# Patient Record
Sex: Male | Born: 1945 | Race: White | Hispanic: No | Marital: Single | Smoking: Former smoker
Health system: Southern US, Community
[De-identification: ages and names within clinical notes are randomized; demographics above are authoritative.]

## PROBLEM LIST (undated history)

## (undated) DIAGNOSIS — K219 Gastro-esophageal reflux disease without esophagitis: Secondary | ICD-10-CM

## (undated) DIAGNOSIS — R0789 Other chest pain: Secondary | ICD-10-CM

## (undated) DIAGNOSIS — J9600 Acute respiratory failure, unspecified whether with hypoxia or hypercapnia: Secondary | ICD-10-CM

## (undated) DIAGNOSIS — I2510 Atherosclerotic heart disease of native coronary artery without angina pectoris: Secondary | ICD-10-CM

## (undated) DIAGNOSIS — H3320 Serous retinal detachment, unspecified eye: Secondary | ICD-10-CM

## (undated) DIAGNOSIS — I739 Peripheral vascular disease, unspecified: Secondary | ICD-10-CM

## (undated) DIAGNOSIS — M1990 Unspecified osteoarthritis, unspecified site: Secondary | ICD-10-CM

## (undated) DIAGNOSIS — H269 Unspecified cataract: Secondary | ICD-10-CM

## (undated) DIAGNOSIS — M545 Low back pain: Secondary | ICD-10-CM

## (undated) DIAGNOSIS — I509 Heart failure, unspecified: Secondary | ICD-10-CM

## (undated) DIAGNOSIS — Z87891 Personal history of nicotine dependence: Secondary | ICD-10-CM

## (undated) DIAGNOSIS — I779 Disorder of arteries and arterioles, unspecified: Secondary | ICD-10-CM

## (undated) DIAGNOSIS — I10 Essential (primary) hypertension: Secondary | ICD-10-CM

## (undated) DIAGNOSIS — F32A Depression, unspecified: Secondary | ICD-10-CM

## (undated) DIAGNOSIS — F329 Major depressive disorder, single episode, unspecified: Secondary | ICD-10-CM

## (undated) DIAGNOSIS — E785 Hyperlipidemia, unspecified: Secondary | ICD-10-CM

## (undated) DIAGNOSIS — J45909 Unspecified asthma, uncomplicated: Secondary | ICD-10-CM

## (undated) DIAGNOSIS — K649 Unspecified hemorrhoids: Secondary | ICD-10-CM

## (undated) DIAGNOSIS — G4730 Sleep apnea, unspecified: Secondary | ICD-10-CM

## (undated) DIAGNOSIS — R918 Other nonspecific abnormal finding of lung field: Principal | ICD-10-CM

## (undated) DIAGNOSIS — H409 Unspecified glaucoma: Secondary | ICD-10-CM

## (undated) DIAGNOSIS — Z87898 Personal history of other specified conditions: Secondary | ICD-10-CM

## (undated) DIAGNOSIS — I4891 Unspecified atrial fibrillation: Secondary | ICD-10-CM

## (undated) HISTORY — DX: Personal history of other specified conditions: Z87.898

## (undated) HISTORY — DX: Other chest pain: R07.89

## (undated) HISTORY — DX: Unspecified asthma, uncomplicated: J45.909

## (undated) HISTORY — DX: Depression, unspecified: F32.A

## (undated) HISTORY — PX: APPENDECTOMY: SHX54

## (undated) HISTORY — DX: Atherosclerotic heart disease of native coronary artery without angina pectoris: I25.10

## (undated) HISTORY — DX: Peripheral vascular disease, unspecified: I73.9

## (undated) HISTORY — DX: Unspecified hemorrhoids: K64.9

## (undated) HISTORY — PX: TUMOR REMOVAL: SHX12

## (undated) HISTORY — DX: Other nonspecific abnormal finding of lung field: R91.8

## (undated) HISTORY — DX: Unspecified osteoarthritis, unspecified site: M19.90

## (undated) HISTORY — DX: Unspecified cataract: H26.9

## (undated) HISTORY — PX: EYE SURGERY: SHX253

## (undated) HISTORY — DX: Low back pain: M54.5

## (undated) HISTORY — PX: HERNIA REPAIR: SHX51

## (undated) HISTORY — PX: GANGLION CYST EXCISION: SHX1691

## (undated) HISTORY — DX: Personal history of nicotine dependence: Z87.891

## (undated) HISTORY — PX: COLONOSCOPY: SHX174

## (undated) HISTORY — DX: Serous retinal detachment, unspecified eye: H33.20

## (undated) HISTORY — DX: Disorder of arteries and arterioles, unspecified: I77.9

## (undated) HISTORY — DX: Major depressive disorder, single episode, unspecified: F32.9

## (undated) HISTORY — DX: Unspecified glaucoma: H40.9

---

## 1999-06-10 ENCOUNTER — Encounter: Payer: Self-pay | Admitting: Family Medicine

## 1999-06-10 ENCOUNTER — Ambulatory Visit (HOSPITAL_COMMUNITY): Admission: RE | Admit: 1999-06-10 | Discharge: 1999-06-10 | Payer: Self-pay | Admitting: Family Medicine

## 2000-08-20 ENCOUNTER — Ambulatory Visit (HOSPITAL_COMMUNITY): Admission: RE | Admit: 2000-08-20 | Discharge: 2000-08-20 | Payer: Self-pay | Admitting: Gastroenterology

## 2002-04-22 ENCOUNTER — Inpatient Hospital Stay (HOSPITAL_COMMUNITY): Admission: EM | Admit: 2002-04-22 | Discharge: 2002-04-25 | Payer: Self-pay | Admitting: *Deleted

## 2003-05-04 ENCOUNTER — Encounter (INDEPENDENT_AMBULATORY_CARE_PROVIDER_SITE_OTHER): Payer: Self-pay | Admitting: Specialist

## 2003-05-04 ENCOUNTER — Ambulatory Visit (HOSPITAL_COMMUNITY): Admission: RE | Admit: 2003-05-04 | Discharge: 2003-05-04 | Payer: Self-pay | Admitting: Gastroenterology

## 2006-02-23 ENCOUNTER — Observation Stay (HOSPITAL_COMMUNITY): Admission: EM | Admit: 2006-02-23 | Discharge: 2006-02-24 | Payer: Self-pay | Admitting: *Deleted

## 2006-02-23 ENCOUNTER — Encounter (INDEPENDENT_AMBULATORY_CARE_PROVIDER_SITE_OTHER): Payer: Self-pay | Admitting: Specialist

## 2008-08-27 ENCOUNTER — Inpatient Hospital Stay (HOSPITAL_COMMUNITY): Admission: EM | Admit: 2008-08-27 | Discharge: 2008-08-29 | Payer: Self-pay | Admitting: Emergency Medicine

## 2008-08-28 ENCOUNTER — Encounter (INDEPENDENT_AMBULATORY_CARE_PROVIDER_SITE_OTHER): Payer: Self-pay | Admitting: Gastroenterology

## 2008-08-30 ENCOUNTER — Emergency Department (HOSPITAL_COMMUNITY): Admission: EM | Admit: 2008-08-30 | Discharge: 2008-08-30 | Payer: Self-pay | Admitting: Emergency Medicine

## 2011-05-12 NOTE — Consult Note (Signed)
NAMEUGO, THOMA              ACCOUNT NO.:  [id number]   MEDICAL RECORD NO.:  [id number]          PATIENT TYPE:  INP   LOCATION:  1425                         FACILITY:  [facility]   PHYSICIAN:  Shirley Friar, MDDATE OF BIRTH:  September 26, 1946   DATE OF CONSULTATION:  08/27/2008  DATE OF DISCHARGE:                                 CONSULTATION   We were asked to see Mr. Ishler today in consultation for GI bleed by  Dr. Jabier Mutton of the [facility] hospitalist service.   HISTORY OF PRESENT ILLNESS:  This is a 65 year old male with a history  of diverticulosis.  He reports bright red blood per rectum very early  this morning. Mr. Mcmiller awoke with what he thought was diarrhea, but  when he examined it turned out to be bright red blood per rectum mixed  with some stool. His last episode of bright red blood per rectum with  stool was about an hour ago, at 1:00 p.m. He reports a small amount of  bilateral lower abdominal soreness, but no particular abdominal pain.  Bowel movements are usually very regular.  He says he has one every  morning at 8:00 a.m. The patient is complaining of irritation from one  external hemorrhoids.  He exercises regularly.  He has lost 29 pounds on  Weight Watchers since January of 2009.  He has colonoscopies every 3  years with Dr. Tasia Catchings.  His last colonoscopy was approximately 2  years ago.  He does have a history of diverticulosis.   PAST MEDICAL HISTORY:  1. Hypertension.  2. Hyperlipidemia.  3. Depression.   SURGERIES:  1. Appendix.  2. Bilateral inguinal hernia repair.   CURRENT MEDICATIONS:  1. Zoloft.  2. Lisinopril.  3. Simvastatin.   ALLERGIES:  He has no known drug allergies.   SOCIAL HISTORY:  Negative for alcohol, tobacco and drugs.  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  He has a dog named Fred.   FAMILY HISTORY:  Is significant for colon cancer in his father, brother  and both sisters.   REVIEW OF  SYSTEMS:  Is negative for fever or recent illness, shortness  of breath, palpitations and as I mentioned, he has lost 29 pounds on  Weight Watchers since January 2009.  He exercises on a daily basis with  his dog Fred. They are walking several miles each day and his  hyperlipidemia and hypertension are under good control with medication  and diet.   PHYSICAL EXAM:  He is alert and oriented in no apparent distress.  CARDIOVASCULAR:  Regular rate and rhythm with no murmurs, rubs or  gallops.  LUNGS:  Clear to auscultation.  ABDOMEN:  Soft, nontender, nondistended, distended with good bowel  sounds.   LABS:  He currently has a hemoglobin of 9.0 with a hematocrit 26.6.  His  previous hemoglobin was 10.3, hematocrit 30.9. White count 8.0,  platelets 218,000 and that was taken at approximately 4:00 a.m. this  morning. BMET shows a BUN of 22, creatinine 0.79. Baseline hemoglobin is  approximately 14 judging by E-chart.  ASSESSMENT:  This is a 65 year old gentleman who is having a lower GI  bleed, likely diverticular in nature.  He has acute blood loss anemia  and hemorrhoids.  We will go ahead and schedule him for a colonoscopy in  the morning with Dr. Dorena Cookey and ask for his records to be sent from  Dr. Lucretia Kern office.  Thanks very much.      Stephani Police, [place]      Shirley Friar, MD  Electronically Signed    MLY/MEDQ  D:  08/27/2008  T:  08/27/2008  Job:  [id number]   cc:   Tasia Catchings, M.D.  Fax: [phone]

## 2011-05-12 NOTE — H&P (Signed)
NAME:  Paul Valdez, Paul Valdez NO.:  [id number]   MEDICAL RECORD NO.:  [id number]          PATIENT TYPE:  EMS   LOCATION:  ED                           FACILITY:  [facility]   PHYSICIAN:  Lucita Ferrara, MD         DATE OF BIRTH:  December 09, 1946   DATE OF ADMISSION:  08/27/2008  DATE OF DISCHARGE:                              HISTORY & PHYSICAL   CHIEF COMPLAINT:  The patient presented to [facility]  with chief complaint of bright red blood per rectum early in the date.  This is accompanied by left lower quadrant abdominal discomfort and  cramping accompanied by diarrhea, relieved by defecation and going to  the bathroom.  He has  had similar episodes in the past and actually  gets colonoscopy every 3 years for a family history of colon cancer.  He  had significant history of polyps in the past with none of them being  cancerous in etiology.   PRIMARY CARE PHYSICIAN:  Theatre stage manager.   GASTROENTEROLOGIST:  Tasia Catchings, M.D.   PAST MEDICAL HISTORY:  1. Hypertension.  2. Hypercholesteremia.   SOCIAL HISTORY:  No IV drugs, alcohol or tobacco.   ALLERGIES:  No known drug allergies.   MEDICATIONS AT HOME:  Include Zoloft, lisinopril, simvastatin.   REVIEW OF SYSTEMS:  As per HPI and otherwise negative.   PHYSICAL EXAMINATION:  GENERAL:  The patient is in no acute distress.  Blood pressure is 111/80, pulse 78, respirations 18, temperature 97.4.  HEENT:  Normocephalic, atraumatic.  Sclerae anicteric.  PERRLA.  External muscles intact.  NECK:  Supple.  No JVD or bruits.  CARDIOVASCULAR:  S1 and S2.  Regular rate and rhythm.  No murmurs, rubs  or clicks.  LUNGS:  Clear to auscultation bilaterally.  No rhonchi, rales or wheeze.  ABDOMEN:  Soft, nontender, nondistended.  Positive bowel sounds.  RECTAL:  There is a obvious hemorrhoids.  Guaiac is positive.  Stool is  dark.  NEURO:  Patient is oriented x3.  Cranial nerves II-XII grossly intact.   LABORATORY DATA:   The patient was typed and screened.  His ISTAT shows a  hemoglobin 12.19, hematocrit 38.  ISTAT shows BUN of 24, creatinine 1.   ASSESSMENT/PLAN:  A 65 year old male with;  1. Bright red blood per rectum with accompanied left lower quadrant      abdominal cramping and diarrhea.  2. History of polyps.  Gets colonoscopies every 3 years for family      history of colon cancer.  3. Hypertension, stable.  4. Hypercholesteremia.  5. Hemorrhoids   DISCUSSION AND PLAN:  Most likely etiology of his right bright red blood  per rectum is internal hemorrhoids versus active hemorrhoids versus  diverticulitis, although he has no fevers or chills.  I do not see a CBC  to look at his white count.  Will go ahead and admit him to the med/surg  floor and get a CBC.  We will get stools for C diff and occult blood.  Will empirically treat with metronidazole.  Will get hemoglobin and  hematocrit  every 6 hours, type and cross and hold.  Transfuse as needed.  We will need to get a GI consultation for further advice.  Will get Dr.  Sherin Quarry on the case.  The rest of the plans are really dependent on his  progress.      Lucita Ferrara, MD  Electronically Signed     RR/MEDQ  D:  08/27/2008  T:  08/27/2008  Job:  [id number]

## 2011-05-12 NOTE — Consult Note (Signed)
NAMERAGE, BEEVER              ACCOUNT NO.:  [id number]   MEDICAL RECORD NO.:  [id number]          PATIENT TYPE:  EMS   LOCATION:  ED                           FACILITY:  [facility]   PHYSICIAN:  John C. Madilyn Fireman, M.D.    DATE OF BIRTH:  08-Jun-1946   DATE OF CONSULTATION:  DATE OF DISCHARGE:  08/30/2008                                 CONSULTATION   REASON FOR CONSULTATION:  Hematochezia.   HISTORY OF ILLNESS:  The patient is a 65 year old white male who was  recently admitted for GI bleeding thought likely secondary to  diverticulosis and discharged yesterday who had a nonbloody bowel  movement yesterday evening but noticed some bright red blood in a bowel  movement today and I did take off a polyp on September 1 and had come to  the emergency room for evaluation.  Since then he had another fairly  formed bowel movement with no visible blood.  He does have known  external hemorrhoids.  He feels fine.  He is having no abdominal pain,  weakness or dizziness.  His discharge hemoglobin was 9.5.  He has no new  complaints.   PHYSICAL EXAM:  HEART:  Regular rate and rhythm without murmurs.  LUNGS:  Clear.  ABDOMEN:  Soft, nondistended with normoactive bowel sounds.  No  hepatosplenomegaly, mass or guarding.  RECTAL:  Rectal exam reveals some  rosette of external hemorrhoids.  There is brown stool in the vault with  no visible blood.   IMPRESSION:  Likely self-limiting gastrointestinal bleed from  hemorrhoids after a recent presumed diverticular bleed.   PLAN:  We will discharge home and call with biopsy results of his  polypectomy.  Note:  Hemoglobin today is 10.1.           ______________________________  Everardo All. Madilyn Fireman, M.D.     JCH/MEDQ  D:  08/30/2008  T:  08/30/2008  Job:  [id number]   cc:   Tasia Catchings, M.D.  Fax: [phone]

## 2011-05-12 NOTE — Op Note (Signed)
Paul Valdez, Paul Valdez              ACCOUNT NO.:  [id number]   MEDICAL RECORD NO.:  [id number]          PATIENT TYPE:  INP   LOCATION:  1425                         FACILITY:  [facility]   PHYSICIAN:  John C. Madilyn Fireman, M.D.    DATE OF BIRTH:  09/26/1946   DATE OF PROCEDURE:  08/28/2008  DATE OF DISCHARGE:                               OPERATIVE REPORT   PROCEDURE:  Colonoscopy with polypectomy.   INDICATIONS FOR PROCEDURE:  Acute lower GI bleed.   PROCEDURE:  The patient was placed in the left lateral decubitus  position and placed on the pulse monitor with continuous low-flow oxygen  delivered by nasal cannula.  He was sedated with 75 mcg IV fentanyl and  6 mg IV Versed.  The Olympus video colonoscope was inserted into the  rectum and advanced to the cecum, confirmed by transillumination of  McBurney's point and visualization of the ileocecal valve and  appendiceal orifice.  The prep was good and there was no blood or bloody  fluid seen throughout the colon.  There were three of four small  somewhat indistinct cecal AVMs with no stigma of hemorrhage.  No therapy  was done to them.  There was one small diverticulum seen at the junction  of the cecum and the ascending colon.  The remainder of the ascending  colon appeared normal.  Within the transverse colon there was seen a 4  mm polyp which was fulgurated by hot biopsy.  The remainder of the  transverse colon appeared normal.  Within the descending sigmoid colon  there were seen numerous small diverticula again, with no stigma of  hemorrhage.  There was no evidence of ischemic or any other type of  visible colitis in the vicinity of the descending sigmoid or rectum down  to the anus.  The rectum appeared normal and retroflexed view revealed  no obviously enlarged internal hemorrhoids.  The scope was then  withdrawn and the patient returned to the recovery room in stable  condition.  He tolerated the procedure well.  There were no  immediate  complications.   IMPRESSION:  1. Small cecal arteriovenous malformations.  2. Descending sigmoid diverticulosis.  3. A single small transverse colon polyp.   PLAN:  Await histology and observe for further bleeding.           ______________________________  Everardo All. Madilyn Fireman, M.D.     JCH/MEDQ  D:  08/28/2008  T:  08/28/2008  Job:  [id number]   cc:   Tasia Catchings, M.D.  Fax: [phone]

## 2011-05-15 NOTE — Discharge Summary (Signed)
[facility]  Patient:    Paul Valdez, Paul Valdez Visit Number: [id number] MRN: [id number]          Service Type: PSY Location: 300 0300 01 Attending Physician:  Jeanice Lim Dictated by:   Jeanice Lim, M.D. Admit Date:  04/22/2002 Discharge Date: 04/25/2002                             Discharge Summary  IDENTIFYING DATA:  This is a 65 year old single Caucasian male voluntarily admitted for depression and suicidal ideation.  MEDICATIONS:  Zocor.  ALLERGIES:  No known drug allergies.  PHYSICAL EXAMINATION:  Performed in [facility] Emergency Department and essentially within normal limits.  Neurologically nonfocal.  LABORATORY DATA:  White count was 17.1.  CMET showed a glucose of 218, BUN 25. Tylenol level 15.6.  Urine drug screen positive for opiates.  MENTAL STATUS EXAMINATION:  The patient was alert, cooperative, reporting significant depressive symptoms.  Overdosed due to feeling overwhelmed and stress related to changing jobs.  Mood severely depressed.  Affect restricted. Thought process goal directed.  Thought content negative for dangerous ideation or psychotic symptoms, positive regarding overdose.  Judgment and insight fair.  ADMISSION DIAGNOSES: Axis I:    1. Major depressive disorder, recurrent, severe.            2. Status post suicide attempt. Axis II:   None. Axis III:  Hypertension. Axis IV:   Moderate (problems with primary support system). Axis V:    20/60.  HOSPITAL COURSE:  The patient was admitted and ordered routine p.r.n. medications.  He was optimized on Zoloft and reported a positive response. Showed no inappropriate behavior.  Participating fully in individual, group and milieu therapy.  Denying any dangerous ideation after adequate monitoring.  CONDITION ON DISCHARGE:  Improved.  Mood was more euthymic.  Affect brighter. Thought process goal directed.  Thought content negative for dangerous ideation or  psychotic symptoms.  The patient reported ability to deal with psychosocial stressors and motivation to be compliant with outpatient plan, comfortable with discharge.  DISCHARGE MEDICATIONS:  Zoloft 50 mg q.a.m.  FOLLOW-UP:  Dr. Katrinka Blazing and Glendell Docker.  DISCHARGE DIAGNOSES: Axis I:    1. Major depressive disorder, recurrent, severe.            2. Status post suicide attempt. Axis II:   None. Axis III:  Hypertension. Axis IV:   Moderate (problems with primary support system). Axis V:    Global Assessment of Functioning on discharge 55. Dictated by:   Jeanice Lim, M.D. Attending Physician:  Jeanice Lim DD:  05/24/02 TD:  05/25/02 Job: [id number]

## 2011-05-15 NOTE — H&P (Signed)
[name], Valdez              ACCOUNT NO.:  [id number]   MEDICAL RECORD NO.:  [id number]          PATIENT TYPE:  EMS   LOCATION:  MAJO                         FACILITY:  [facility]   PHYSICIAN:  Paul Dare. Janee Valdez, M.D.DATE OF BIRTH:  1946/01/12   DATE OF ADMISSION:  02/23/2006  DATE OF DISCHARGE:                                HISTORY & PHYSICAL   CHIEF COMPLAINT:  Right lower quadrant abdominal pain.   HISTORY OF PRESENT ILLNESS:  The patient is a 65 year old white male who  developed mid abdominal pain yesterday afternoon.  This has gradually over  time localized to his right lower quadrant.  He has had anorexia but no  vomiting.  He saw his primary medical doctor today who sent him for CT scan  of the abdomen and pelvis at [facility].  This demonstrated  appendicitis, and he was referred to [facility] Emergency Department.   PAST MEDICAL HISTORY:  1.  Hypertension.  2.  Hypercholesterolemia.  3.  Depression.   PAST SURGICAL HISTORY:  1.  Laparoscopic bilateral inguinal hernia repair.  2.  Prostate biopsies.   MEDICATIONS:  1.  Citrulline 100 mg p.o. daily.  2.  Doxazosin 8 mg daily.  3.  Lisinopril/hydrochlorothiazide 10/12.5 one daily.  4.  Vitorin 10/40 one daily.   ALLERGIES:  No known drug allergies.   SOCIAL HISTORY:  He does not smoke.  He works for [employer].   REVIEW OF SYSTEMS:  GENERAL: Negative.  CARDIAC: Negative.  PULMONARY:  Negative.  GI: As above plus the patient had some diarrhea today, and he is  hungry now.  GU: Negative.  MUSCULOSKELETAL: Negative.   PHYSICAL EXAMINATION:  VITAL SIGNS:  Temperature 100.1, blood pressure  148/87, pulse 99, respirations 20.  GENERAL:  He is awake and alert.  HEENT: Pupils are equal.  Sclerae clear.  NECK:  Supple.  LUNGS:  Clear to auscultation.  HEART:  Regular.  Impulse palpable in the left chest.  ABDOMEN:  Soft.  No masses are palpated.  He has tenderness in the right  lower quadrant with  voluntary guarding. No organomegaly is noted.  MUSCULOSKELETAL: No focal weakness.  SKIN: Warm and dry with no rashes.   DATA REVIEWED:  Labs are pending.   CT scan was reviewed with radiologist here and demonstrates inflammation  around the cecum with a couple of bubbles of air that may represent  peroration of acute appendicitis.   IMPRESSION:  Acute appendicitis with possible perforation.   PLAN:  We will take him to the operating room for emergency laparoscopic  appendectomy.  Procedure, risks, and benefits were discussed in detail with  the patient, and he is agreeable.  We will give him intravenous antibiotics  and heparin.  We will check some STAT labs.      Paul Valdez, M.D.  Electronically Signed     BET/MEDQ  D:  02/23/2006  T:  02/23/2006  Job:  [id number]

## 2011-05-15 NOTE — Op Note (Signed)
Paul Valdez, Paul Valdez              ACCOUNT NO.:  [id number]   MEDICAL RECORD NO.:  [id number]          PATIENT TYPE:  INP   LOCATION:  2550                         FACILITY:  [facility]   PHYSICIAN:  Gabrielle Dare. [name], M.D.DATE OF BIRTH:  [birth year]   DATE OF PROCEDURE:  02/23/2006  DATE OF DISCHARGE:                                 OPERATIVE REPORT   PREOPERATIVE DIAGNOSIS:  Acute appendicitis.   POSTOPERATIVE DIAGNOSIS:  Acute appendicitis.   PROCEDURE:  Laparoscopic appendectomy.   SURGEON:  Gabrielle Dare. [name], M.D.   ANESTHESIA:  General.   HISTORY OF PRESENT ILLNESS:  The patient is a 65 year old male that  developed generalized abdominal pain yesterday afternoon; this gradually  localized to the right lower quadrant.  He was seen by his primary care  doctor today and sent for CT scan of the abdomen and pelvic at [facility]  Radiology; this was consistent with acute appendicitis and he was sent to  [facility] for care.  His history and physical examination were  consistent and he is brought to the operating room for emergency  appendectomy.   PROCEDURE IN DETAIL:  Informed consent was obtained.  The patient received  intravenous antibiotics.  He was identified in the preop holding area and  brought to the operating room.  General anesthesia was administered.  His  abdomen was prepped and draped in a sterile fashion.  Marcaine 0.25% with  epinephrine was injected in the infraumbilical region.  An infraumbilical  incision was made along his previous scar and the subcutaneous tissues were  dissected down, revealing the anterior fascia, which was divided sharply.  The peritoneal cavity was entered under direct vision.  A 0 Vicryl  pursestring suture was placed around the fascial opening and the Hasson  trocar was inserted into the abdomen and the abdomen was insufflated with  carbon dioxide in the standard fashion.  Under direct vision, a 12-mm left  lower quadrant and  a 5-mm right upper quadrant port were placed.  Marcaine  0.25% was used at each port site.  Laparoscopic exploration revealed an  inflamed appendix.  It was retracted superiorly.  Dissection of the  mesoappendix was then accomplished without difficulty.  The mesoappendix was  then divided with a endoscopic GIA with a vascular load and subsequently,  the base of the appendix was then divided with an endoscopic GIA stapler  with a vascular load.  The appendix was placed in an EndoCatch bag and taken  out of the abdomen via the left lower quadrant port site.  The abdomen was  then copiously irrigated; irrigation fluid was clear.  The staple lines were  rechecked and there was no bleeding and they were nicely intact.  The  remainder of the irrigation fluid was evacuated.  The staple lines were  rechecked and were intact with no bleeding.  Pneumoperitoneum was released.  The ports were removed under direct vision and the Hasson trocar was removed  from the abdomen.  The infraumbilical fascia was closed by tying a 0 Vicryl  pursestring suture.  All 3 wounds were copiously irrigated.  Some  additional  local anesthetic was injected and the skin of each was closed with a running  4-  0 Vicryl subcuticular stitch.  Sponge, needle and instrument counts were  correct.  Benzoin, Steri-Strips and sterile dressings were applied.  The  patient tolerated the procedure well without apparent complication and was  taken to the recovery room in stable condition.      Gabrielle Dare [name], M.D.  Electronically Signed     BET/MEDQ  D:  02/23/2006  T:  02/24/2006  Job:  [id number]

## 2011-05-15 NOTE — Discharge Summary (Signed)
Paul Valdez, STEEVES              ACCOUNT NO.:  [id number]   MEDICAL RECORD NO.:  [id number]          PATIENT TYPE:  INP   LOCATION:  5715                         FACILITY:  [facility]   PHYSICIAN:  Gabrielle Dare. [name], M.D.DATE OF BIRTH:  07/29/1946   DATE OF ADMISSION:  02/23/2006  DATE OF DISCHARGE:  02/24/2006                                 DISCHARGE SUMMARY   DISCHARGE DIAGNOSIS:  1.  Acute appendicitis.  2.  Status post laparoscopic appendectomy.   HISTORY OF PRESENT ILLNESS:  The patient is a 65 year old male who developed  generalized abdominal pain that gradually localized to his right lower  quadrant.  On the day of admission, he was seen by his primary care doctor  who sent him for a CT scan at [facility].  This showed  appendicitis.  He was sent to [facility] Emergency Department.   HOSPITAL COURSE:  The patient was admitted and underwent emergency  laparoscopic appendectomy. This was uncomplicated.  Postoperatively, he  remained afebrile and hemodynamically stable.  He tolerated gradual  advancement of his diet and was discharged home on postoperative day one in  stable condition.   DISCHARGE DIET:  Regular.   DISCHARGE ACTIVITY:  No lifting.   DISCHARGE MEDICATIONS:  Percocet 5/325, 1-2 q.6h. p.r.n. pain.  He will also  continue his home medications.   FOLLOW UP:  Follow-up is in two weeks with myself.      Gabrielle Dare [name], M.D.  Electronically Signed     BET/MEDQ  D:  04/22/2006  T:  04/22/2006  Job:  [id number]

## 2011-05-15 NOTE — Discharge Summary (Signed)
Paul Valdez, Paul Valdez              ACCOUNT NO.:  [id number]   MEDICAL RECORD NO.:  [id number]          PATIENT TYPE:  INP   LOCATION:  1425                         FACILITY:  [facility]   PHYSICIAN:  Hollice Espy, M.D.DATE OF BIRTH:  October 29, 1946   DATE OF ADMISSION:  08/27/2008  DATE OF DISCHARGE:  08/29/2008                               DISCHARGE SUMMARY   PRIMARY CARE PHYSICIAN:  Dr. Elvera Lennox.   DISCHARGE DIAGNOSES:  1. Diverticular bleed, self-contained.  2. Hypertension.  3. Hyperlipidemia.  4. Depression.   DISCHARGE MEDICATIONS:  1. Lisinopril 5 p.o. daily.  2. Simvastatin 20 p.o. q.h.s.  3. Zoloft 50 p.o. daily.  These medications will be restarted in 5      days.   HOSPITAL COURSE:  The patient is a 65 year old white male with a past  medical history of hypertension, hyperlipidemia and depression, who  presented with several days of bright red blood per rectum.  He was  feeling very weak.  He was made n.p.o. and put on IV fluids.  His  hemoglobin was noted to be 10.3 with IV fluids.  There is a question  about his baseline being around 14.  His hemoglobin dropped to 7.8 on  hospital day 2.  GI was consulted who saw the patient.  The patient  underwent a colonoscopy which he tolerated well.  The patient did  require 2 units of packed red blood cells which he was transfused and  hemoglobin improved to 9.6 on August 28, 2008.  The patient underwent  a colonoscopy which showed 1 single polyp which was sent for pathology.  This was thought most likely he had diverticular bleeding, although the  possibility of AVMs was present.  The patient was stable on the  afternoon of August 29, 2008 tolerating p.o. and felt to be cleared  by GI.  He was having no further bleeding.  His hemoglobin actually  improved from August 28, 2008 to August 29, 2008 with his  hemoglobin increasing from 9.2 to 9.5.  At this point, he is felt to be  medically stable for  discharge.   FOLLOW UP:  The patient will follow up with his PCP, Dr. Elvera Lennox in  2 weeks.   DISCHARGE DIET:  Low-sodium diet.   ACTIVITY:  Increase activity slowly.   DISPOSITION:  He is being discharged to home.  His overall disposition  is improved.      Hollice Espy, M.D.  Electronically Signed     SKK/MEDQ  D:  09/26/2008  T:  09/26/2008  Job:  [id number]   cc:   Elvera Lennox, MD   Llana Aliment. Malon Kindle., M.D.  Fax: [phone]

## 2011-09-29 ENCOUNTER — Other Ambulatory Visit: Payer: Self-pay | Admitting: Ophthalmology

## 2011-09-30 LAB — CBC
HCT: 28.3 — ABNORMAL LOW
HCT: 29.9 — ABNORMAL LOW
Hemoglobin: 9.5 — ABNORMAL LOW
Hemoglobin: 9.6 — ABNORMAL LOW
MCHC: 33.7
MCHC: 34.1
MCHC: 34.3
MCV: 92.6
MCV: 92.8
MCV: 93.6
Platelets: 159
RBC: 2.98 — ABNORMAL LOW
RBC: 3.05 — ABNORMAL LOW
RBC: 3.19 — ABNORMAL LOW
RDW: 14
WBC: 7.5

## 2011-09-30 LAB — BASIC METABOLIC PANEL
BUN: 12
CO2: 24
Calcium: 8 — ABNORMAL LOW
Chloride: 110
Creatinine, Ser: 0.9
GFR calc Af Amer: >60
GFR calc non Af Amer: >60
Glucose, Bld: 148 — ABNORMAL HIGH
Potassium: 3.7
Sodium: 140

## 2011-09-30 LAB — DIFFERENTIAL
Basophils Relative: 2 — ABNORMAL HIGH
Eosinophils Absolute: 0.2
Eosinophils Relative: 2
Monocytes Absolute: 0.8
Monocytes Relative: 10
Neutrophils Relative %: 68

## 2011-09-30 LAB — HEMOGLOBIN AND HEMATOCRIT, BLOOD
HCT: 28.5 — ABNORMAL LOW
Hemoglobin: 9 — ABNORMAL LOW
Hemoglobin: 9.5 — ABNORMAL LOW

## 2013-04-10 ENCOUNTER — Emergency Department (HOSPITAL_COMMUNITY)
Admission: EM | Admit: 2013-04-10 | Discharge: 2013-04-10 | Disposition: A | Discharge status: Home / Self Care | Payer: Medicare Other | Attending: Emergency Medicine | Admitting: Emergency Medicine

## 2013-04-10 ENCOUNTER — Telehealth: Payer: Self-pay | Admitting: Cardiology

## 2013-04-10 ENCOUNTER — Emergency Department (HOSPITAL_COMMUNITY): Payer: Medicare Other

## 2013-04-10 ENCOUNTER — Encounter (HOSPITAL_COMMUNITY): Payer: Self-pay | Admitting: Emergency Medicine

## 2013-04-10 DIAGNOSIS — R079 Chest pain, unspecified: Secondary | ICD-10-CM

## 2013-04-10 DIAGNOSIS — Z7982 Long term (current) use of aspirin: Secondary | ICD-10-CM | POA: Insufficient documentation

## 2013-04-10 DIAGNOSIS — R0789 Other chest pain: Secondary | ICD-10-CM | POA: Insufficient documentation

## 2013-04-10 DIAGNOSIS — Z79899 Other long term (current) drug therapy: Secondary | ICD-10-CM | POA: Insufficient documentation

## 2013-04-10 DIAGNOSIS — R011 Cardiac murmur, unspecified: Secondary | ICD-10-CM | POA: Insufficient documentation

## 2013-04-10 DIAGNOSIS — E785 Hyperlipidemia, unspecified: Secondary | ICD-10-CM | POA: Insufficient documentation

## 2013-04-10 DIAGNOSIS — I10 Essential (primary) hypertension: Secondary | ICD-10-CM | POA: Insufficient documentation

## 2013-04-10 HISTORY — DX: Essential (primary) hypertension: I10

## 2013-04-10 HISTORY — DX: Hyperlipidemia, unspecified: E78.5

## 2013-04-10 LAB — POCT I-STAT, CHEM 8
Calcium, Ion: 1.21 mmol/L (ref 1.13–1.30)
Glucose, Bld: 108 mg/dL — ABNORMAL HIGH (ref 70–99)
HCT: 39 % (ref 39.0–52.0)
Hemoglobin: 13.3 g/dL (ref 13.0–17.0)
Potassium: 4.6 mEq/L (ref 3.5–5.1)
TCO2: 26 mmol/L (ref 0–100)

## 2013-04-10 MED ORDER — NITROGLYCERIN 0.4 MG SL SUBL: 0.4000 mg | SUBLINGUAL_TABLET | SUBLINGUAL | Status: DC | PRN

## 2013-04-10 NOTE — Telephone Encounter (Signed)
Called by Dr. Radford Pax in the ER regarding Paul Valdez. He relayed to me that [name] is a 67 yo male with risk factors of HTN and hyperlipidemia who presented with chest pain after mowing the yard. Initially presented to PCP who referred him to the ER. In the ER, per report from Dr. Radford Pax ( I did not see or evaluate this patient) , EKG nonspecific and troponin x 2 negative. Patient does not wish to stay for evaluation despite recommendation from ER team. Asked by ER to assist with expediting followup with [name] whom I cross-cover. I indicated that I would assist and notify [facility] team in the AM but the best route for quick and proper evaluation would be to admit. Voiced understanding but patient adamant to leave.

## 2013-04-10 NOTE — ED Provider Notes (Signed)
History     CSN: [id number]  Arrival date & time 04/10/13  1709   First MD Initiated Contact with Patient 04/10/13 1714      Chief Complaint  Patient presents with  . Chest Pain     HPI  Pt presents with generalized chest pain around 1500 today. Pt finished push mowing his grass around 1400, began to notice pain an hour afterwards. 324 ASA given, 1 nitro - pain from 6/10 to 1/10. IV in place. NSR on monitor.patient had no shortness of breath or diaphoresis or nausea.patient has no significant family history of coronary disease       Past Medical History  Diagnosis Date  . Hypertension   . Hyperlipemia     Past Surgical History  Procedure Laterality Date  . Hernia repair      No family history on file.  History  Substance Use Topics  . Smoking status: Never Smoker   . Smokeless tobacco: Not on file  . Alcohol Use: No      Review of Systems All other systems reviewed and are negative  Allergies  Review of patient's allergies indicates no known allergies.  Home Medications   Current Outpatient Rx  Name  Route  Sig  Dispense  Refill  . aspirin EC 81 MG tablet   Oral   Take 81 mg by mouth daily.         Marland Kitchen esomeprazole (NEXIUM) 40 MG capsule   Oral   Take 40 mg by mouth daily before breakfast.         . lisinopril (PRINIVIL,ZESTRIL) 40 MG tablet   Oral   Take 40 mg by mouth daily.         . meloxicam (MOBIC) 15 MG tablet   Oral   Take 15 mg by mouth daily as needed for pain.         Marland Kitchen sertraline (ZOLOFT) 100 MG tablet   Oral   Take 100 mg by mouth daily.         . simvastatin (ZOCOR) 40 MG tablet   Oral   Take 40 mg by mouth every evening.         . nitroGLYCERIN (NITROSTAT) 0.4 MG SL tablet   Sublingual   Place 1 tablet (0.4 mg total) under the tongue every 5 (five) minutes as needed for chest pain.   30 tablet   0     BP 152/78  Pulse 67  Temp(Src) 98.1 F (36.7 C) (Oral)  Resp 16  SpO2 96%  Physical Exam  Nursing note  and vitals reviewed. Constitutional: He is oriented to person, place, and time. He appears well-developed and well-nourished. No distress.  HENT:  Head: Normocephalic and atraumatic.  Eyes: Pupils are equal, round, and reactive to light.  Neck: Normal range of motion.  Cardiovascular: Normal rate and intact distal pulses.  Exam reveals no gallop.   Murmur heard. Pulmonary/Chest: No respiratory distress. He has no wheezes.  Abdominal: Normal appearance. He exhibits no distension. There is no tenderness.  Musculoskeletal: Normal range of motion.  Neurological: He is alert and oriented to person, place, and time. No cranial nerve deficit.  Skin: Skin is warm and dry. No rash noted.  Psychiatric: He has a normal mood and affect. His behavior is normal.    ED Course  Procedures (including critical care time)  Date: 04/10/2013  Rate: 71  Rhythm: normal sinus rhythm  QRS Axis: right axis deviation  Intervals: normal  ST/T Wave abnormalities:  nonspecific T wave abnormality  Conduction Disutrbances: none  Narrative Interpretation: abnormal EKG     Labs Reviewed  POCT I-STAT, CHEM 8 - Abnormal; Notable for the following:    Glucose, Bld 108 (*)    All other components within normal limits  POCT I-STAT TROPONIN I  POCT I-STAT TROPONIN I   Dg Chest 2 View  04/10/2013  *RADIOLOGY REPORT*  Clinical Data: Chest pain  CHEST - 2 VIEW  Comparison: None.  Findings: Eventration right hemidiaphragm.  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  Hiatal hernia.  Lungs are clear without infiltrate or effusion.  Negative for heart failure.  Heart size is normal.  IMPRESSION: No acute cardiopulmonary abnormality.   Original Report Authenticated By: Janeece Riggers, M.D.      1. Chest pain       MDM  Patient's pain decreased significantly.  I encouraged patient to spend the night and be admitted to hospital but he was adamant about going home.I did explain the risks of going home .  I spoke  with [facility] cardiology and arranged close followup.        Nelia Shi, MD 04/10/13 [phone]

## 2013-04-10 NOTE — ED Notes (Signed)
Pt presents with generalized chest pain around 1500 today.  Pt finished push mowing his grass around 1400, began to notice pain an hour afterwards.  324 ASA given, 1 nitro - pain from 6/10 to 1/10.  IV in place.  NSR on monitor.

## 2013-04-10 NOTE — ED Notes (Signed)
Pt went to PCP for evaluation of chest pain- EMS called from there for transport to ED.  Pt complaining of slight discomfort to the left side of his chest at present.  Pt on cardiac monitor and IV in place.  Dr. Radford Pax at bedside.  Will continue to monitor pt.

## 2013-04-10 NOTE — ED Notes (Signed)
Paged Dr. Adolm Joseph to [phone]

## 2014-02-08 ENCOUNTER — Other Ambulatory Visit: Payer: Self-pay | Admitting: Physician Assistant

## 2014-02-08 ENCOUNTER — Ambulatory Visit
Admission: RE | Admit: 2014-02-08 | Discharge: 2014-02-08 | Disposition: A | Discharge status: Home / Self Care | Payer: Medicare Other | Source: Ambulatory Visit | Attending: Physician Assistant | Admitting: Physician Assistant

## 2014-02-08 DIAGNOSIS — R52 Pain, unspecified: Secondary | ICD-10-CM

## 2014-02-08 DIAGNOSIS — T1490XA Injury, unspecified, initial encounter: Secondary | ICD-10-CM

## 2014-09-11 ENCOUNTER — Other Ambulatory Visit: Payer: Self-pay | Admitting: Gastroenterology

## 2014-11-07 ENCOUNTER — Encounter (INDEPENDENT_AMBULATORY_CARE_PROVIDER_SITE_OTHER): Payer: Medicare Other | Admitting: Ophthalmology

## 2014-11-07 ENCOUNTER — Telehealth: Payer: Self-pay | Admitting: Nurse Practitioner

## 2014-11-07 DIAGNOSIS — H33302 Unspecified retinal break, left eye: Secondary | ICD-10-CM

## 2014-11-07 DIAGNOSIS — H43813 Vitreous degeneration, bilateral: Secondary | ICD-10-CM

## 2014-11-07 DIAGNOSIS — H35033 Hypertensive retinopathy, bilateral: Secondary | ICD-10-CM

## 2014-11-07 DIAGNOSIS — G453 Amaurosis fugax: Secondary | ICD-10-CM

## 2014-11-07 DIAGNOSIS — I10 Essential (primary) hypertension: Secondary | ICD-10-CM

## 2014-11-07 NOTE — Telephone Encounter (Signed)
Received call from Dr. Tempie Hoist, opthamologist to Dr. Acie Fredrickson who is DOD.  Per Dr. Acie Fredrickson, patient needs carotid duplex and echocardiogram for amaurosis fugax and then f/u with Dr. Irish Lack.  Echo and carotid orders in epic and note sent to [facility], Dr. Irish Lack, and Alvis Lemmings, RN for f/u appointments.  Patient will need to be notified of appointments.

## 2014-11-11 ENCOUNTER — Encounter: Payer: Self-pay | Admitting: *Deleted

## 2014-11-12 ENCOUNTER — Ambulatory Visit (HOSPITAL_COMMUNITY): Payer: Medicare Other | Attending: Cardiovascular Disease | Admitting: Cardiology

## 2014-11-12 ENCOUNTER — Ambulatory Visit (HOSPITAL_COMMUNITY): Payer: Medicare Other | Admitting: Cardiology

## 2014-11-12 DIAGNOSIS — H539 Unspecified visual disturbance: Secondary | ICD-10-CM

## 2014-11-12 DIAGNOSIS — G453 Amaurosis fugax: Secondary | ICD-10-CM | POA: Insufficient documentation

## 2014-11-12 DIAGNOSIS — I6523 Occlusion and stenosis of bilateral carotid arteries: Secondary | ICD-10-CM

## 2014-11-12 NOTE — Progress Notes (Signed)
Echo performed. 

## 2014-11-12 NOTE — Progress Notes (Signed)
Carotid duplex performed 

## 2014-11-21 ENCOUNTER — Ambulatory Visit (INDEPENDENT_AMBULATORY_CARE_PROVIDER_SITE_OTHER): Payer: Medicare Other | Admitting: Interventional Cardiology

## 2014-11-21 ENCOUNTER — Encounter: Payer: Self-pay | Admitting: Interventional Cardiology

## 2014-11-21 ENCOUNTER — Ambulatory Visit (INDEPENDENT_AMBULATORY_CARE_PROVIDER_SITE_OTHER): Payer: Medicare Other | Admitting: Ophthalmology

## 2014-11-21 VITALS — BP 112/72 | HR 68 | Ht 68.0 in | Wt 217.8 lb

## 2014-11-21 DIAGNOSIS — H33302 Unspecified retinal break, left eye: Secondary | ICD-10-CM

## 2014-11-21 DIAGNOSIS — I779 Disorder of arteries and arterioles, unspecified: Secondary | ICD-10-CM

## 2014-11-21 DIAGNOSIS — H539 Unspecified visual disturbance: Secondary | ICD-10-CM

## 2014-11-21 DIAGNOSIS — I739 Peripheral vascular disease, unspecified: Secondary | ICD-10-CM

## 2014-11-21 NOTE — Patient Instructions (Signed)
Your physician recommends that you continue on your current medications as directed. Please refer to the Current Medication list given to you today.  No follow up is needed at this time with Dr.  Irish Lack. He will see you on an as needed basis.

## 2014-11-21 NOTE — Progress Notes (Signed)
Patient ID: Paul Valdez, male   DOB: [birth year], 68 y.o.   MRN: [id number]    [place], [address] [place], [place]  [address] Phone: [phone] Fax:  [phone]  Date:  11/21/2014   ID:  Paul Valdez, DOB 07/08/1946, MRN [id number]  PCP:  Paul Valdez      History of Present Illness: Paul Valdez is a 68 y.o. male who I saw in 2014 due to atypical chest pain. At that time, he had a negative treadmill test. Several weeks ago, he had visual changes. He went to the emergency room. He was eventually referred for a carotid Doppler and echocardiogram, neither of which showed an etiology for the visual changes. He ended up seeing an ophthalmologist and having a detached retina repaired. He also needs new glasses. His vision has improved but is not 100% back to normal.   Wt Readings from Last 3 Encounters:  11/21/14 217 lb 12.8 oz (98.793 kg)     Past Medical History  Diagnosis Date  . Hypertension   . Hyperlipemia     Current Outpatient Prescriptions  Medication Sig Dispense Refill  . allopurinol (ZYLOPRIM) 100 MG tablet   1  . atorvastatin (LIPITOR) 40 MG tablet   2  . lisinopril (PRINIVIL,ZESTRIL) 40 MG tablet Take 40 mg by mouth daily.    . nitroGLYCERIN (NITROSTAT) 0.4 MG SL tablet Place 1 tablet (0.4 mg total) under the tongue every 5 (five) minutes as needed for chest pain. 30 tablet 0  . omeprazole (PRILOSEC) 20 MG capsule   2  . sertraline (ZOLOFT) 100 MG tablet Take 100 mg by mouth daily.     No current facility-administered medications for this visit.    Allergies:   No Known Allergies  Social History:  The patient  reports that he has never smoked. He does not have any smokeless tobacco history on file. He reports that he does not drink alcohol.   Family History:  The patient's family history includes Colon cancer in his father.   ROS:  Please see the history of present illness.  No nausea, vomiting.  No fevers, chills.  No focal weakness.  No  dysuria.  visual change.   All other systems reviewed and negative.   PHYSICAL EXAM: VS:  BP 112/72 mmHg  Pulse 68  Ht 5\' 8"  (1.727 m)  Wt 217 lb 12.8 oz (98.793 kg)  BMI 33.12 kg/m2 General: Well developed, well nourished, in no acute distress HEENT: normal Neck: no JVD, no carotid bruits Cardiac:  normal S1, S2; RRR;  Lungs:  clear to auscultation bilaterally, no wheezing, rhonchi or rales Abd: soft, nontender, no hepatomegaly Ext: no edema Skin: warm and dry Neuro:   no focal abnormalities noted Psych: normal affect  EKG:  Normal sinus rhythm, PVC    ASSESSMENT AND PLAN:  1. Visual changes: Carotid Doppler and echo are essentially negative. We'll see how he responds to the partial retinal detachment repair as well as new glasses. No further cardiac workup plan. Overall, things are better. He denies any ischemic symptoms with his heart.  I will see him back on a when necessary basis. Will follow-up on his mild carotid artery disease.  Signed, Paul Marble, Paul, [facility] 11/21/2014 4:23 PM

## 2015-04-01 ENCOUNTER — Ambulatory Visit (INDEPENDENT_AMBULATORY_CARE_PROVIDER_SITE_OTHER): Payer: Medicare Other | Admitting: Ophthalmology

## 2015-04-13 ENCOUNTER — Ambulatory Visit (INDEPENDENT_AMBULATORY_CARE_PROVIDER_SITE_OTHER): Payer: Medicare Other | Admitting: Emergency Medicine

## 2015-04-13 ENCOUNTER — Ambulatory Visit (INDEPENDENT_AMBULATORY_CARE_PROVIDER_SITE_OTHER): Payer: Medicare Other

## 2015-04-13 VITALS — BP 150/90 | HR 73 | Temp 97.3°F | Resp 12 | Ht 67.0 in | Wt 212.1 lb

## 2015-04-13 DIAGNOSIS — M79631 Pain in right forearm: Secondary | ICD-10-CM | POA: Diagnosis not present

## 2015-04-13 DIAGNOSIS — S5011XD Contusion of right forearm, subsequent encounter: Secondary | ICD-10-CM

## 2015-04-13 NOTE — Patient Instructions (Signed)
Contusion °A contusion is a deep bruise. Contusions are the result of an injury that caused bleeding under the skin. The contusion may turn blue, purple, or yellow. Minor injuries will give you a painless contusion, but more severe contusions may stay painful and swollen for a few weeks.  °CAUSES  °A contusion is usually caused by a blow, trauma, or direct force to an area of the body. °SYMPTOMS  °· Swelling and redness of the injured area. °· Bruising of the injured area. °· Tenderness and soreness of the injured area. °· Pain. °DIAGNOSIS  °The diagnosis can be made by taking a history and physical exam. An X-ray, CT scan, or MRI may be needed to determine if there were any associated injuries, such as fractures. °TREATMENT  °Specific treatment will depend on what area of the body was injured. In general, the best treatment for a contusion is resting, icing, elevating, and applying cold compresses to the injured area. Over-the-counter medicines may also be recommended for pain control. Ask your caregiver what the best treatment is for your contusion. °HOME CARE INSTRUCTIONS  °· Put ice on the injured area. °¨ Put ice in a plastic bag. °¨ Place a towel between your skin and the bag. °¨ Leave the ice on for 15-20 minutes, 3-4 times a day, or as directed by your health care provider. °· Only take over-the-counter or prescription medicines for pain, discomfort, or fever as directed by your caregiver. Your caregiver may recommend avoiding anti-inflammatory medicines (aspirin, ibuprofen, and naproxen) for 48 hours because these medicines may increase bruising. °· Rest the injured area. °· If possible, elevate the injured area to reduce swelling. °SEEK IMMEDIATE MEDICAL CARE IF:  °· You have increased bruising or swelling. °· You have pain that is getting worse. °· Your swelling or pain is not relieved with medicines. °MAKE SURE YOU:  °· Understand these instructions. °· Will watch your condition. °· Will get help right  away if you are not doing well or get worse. °Document Released: 09/23/2005 Document Revised: 12/19/2013 Document Reviewed: 10/19/2011 °ExitCare® Patient Information ©2015 [employer]. This information is not intended to replace advice given to you by your health care provider. Make sure you discuss any questions you have with your health care provider. ° °

## 2015-04-13 NOTE — Progress Notes (Signed)
[facility] [address] [place] [address] [phone]  Date:  04/13/2015   Name:  Paul Valdez   DOB:  Oct 13, 1946   MRN:  [id number]  PCP:  Namon Cirri    Chief Complaint: Arm Swelling and Bumps on Face   History of Present Illness:  Paul Valdez is a 69 y.o. very pleasant male patient who presents with the following:  Injured in a fall two weeks ago.  Says he was seen by a podiatrist and told he has a "hairline" fracture Put in a boot Since fall he has experienced pain in right forearm with ecchymosis and is concerned he may have a fracture Worse with use Better with rest No improvement with over the counter medications or other home remedies.  Denies other complaint or health concern today.   Patient Active Problem List   Diagnosis Date Noted  . Carotid artery disease 11/21/2014    Past Medical History  Diagnosis Date  . Hypertension   . Hyperlipemia   . Arthritis   . Asthma   . Cataract   . Depression   . Glaucoma     Past Surgical History  Procedure Laterality Date  . Hernia repair    . Appendectomy    . Eye surgery      History  Substance Use Topics  . Smoking status: Former Research scientist (life sciences)  . Smokeless tobacco: Never Used  . Alcohol Use: No    Family History  Problem Relation Age of Onset  . Colon cancer Father   . Hypertension Father   . Hypertension Mother   . Colon cancer Sister   . Colon cancer Brother     No Known Allergies  Medication list has been reviewed and updated.  Current Outpatient Prescriptions on File Prior to Visit  Medication Sig Dispense Refill  . allopurinol (ZYLOPRIM) 100 MG tablet   1  . atorvastatin (LIPITOR) 40 MG tablet   2  . lisinopril (PRINIVIL,ZESTRIL) 40 MG tablet Take 40 mg by mouth daily.    . nitroGLYCERIN (NITROSTAT) 0.4 MG SL tablet Place 1 tablet (0.4 mg total) under the tongue every 5 (five) minutes as needed for chest pain. 30 tablet 0  . omeprazole (PRILOSEC) 20 MG  capsule   2  . sertraline (ZOLOFT) 100 MG tablet Take 100 mg by mouth daily.     No current facility-administered medications on file prior to visit.    Review of Systems:  As per HPI, otherwise negative.    Physical Examination: Filed Vitals:   04/13/15 1109  BP: 150/90  Pulse: 73  Temp: 97.3 F (36.3 C)  Resp: 12   Filed Vitals:   04/13/15 1109  Height: 5\' 7"  (1.702 m)  Weight: 212 lb 2 oz (96.219 kg)   Body mass index is 33.22 kg/(m^2). Ideal Body Weight: Weight in (lb) to have BMI = 25: 159.3   GEN: WDWN, NAD, Non-toxic, Alert & Oriented x 3   HEENT: Atraumatic, Normocephalic. PRRERLA EOMI Ears and Nose: No external deformity. EXTR: No clubbing/cyanosis/edema NEURO: Normal gait.  PSYCH: Normally interactive. Conversant. Not depressed or anxious appearing.  Calm demeanor.  RIGHT forearm:  Distal swelling and ecchymosis.  No deformity.  Neurovascular intact  Assessment and Plan: Contusion forearm Warmth  Signed,  Ellison Carwin, MD   [facility] reading (PRIMARY) by  Dr. Ouida Sills.  negative.

## 2015-04-15 ENCOUNTER — Ambulatory Visit (INDEPENDENT_AMBULATORY_CARE_PROVIDER_SITE_OTHER): Payer: Self-pay | Admitting: Ophthalmology

## 2015-08-30 ENCOUNTER — Ambulatory Visit
Admission: RE | Admit: 2015-08-30 | Discharge: 2015-08-30 | Disposition: A | Discharge status: Home / Self Care | Payer: Medicare Other | Source: Ambulatory Visit | Attending: Family Medicine | Admitting: Family Medicine

## 2015-08-30 ENCOUNTER — Other Ambulatory Visit: Payer: Self-pay | Admitting: Family Medicine

## 2015-08-30 DIAGNOSIS — M79605 Pain in left leg: Secondary | ICD-10-CM

## 2016-09-28 ENCOUNTER — Ambulatory Visit: Payer: Medicare Other | Admitting: Neurology

## 2016-10-08 ENCOUNTER — Other Ambulatory Visit: Payer: Self-pay | Admitting: Specialist

## 2016-10-08 DIAGNOSIS — M5416 Radiculopathy, lumbar region: Secondary | ICD-10-CM

## 2016-10-21 ENCOUNTER — Ambulatory Visit
Admission: RE | Admit: 2016-10-21 | Discharge: 2016-10-21 | Disposition: A | Discharge status: Home / Self Care | Payer: Medicare Other | Source: Ambulatory Visit | Attending: Specialist | Admitting: Specialist

## 2016-10-21 DIAGNOSIS — M5416 Radiculopathy, lumbar region: Secondary | ICD-10-CM

## 2016-11-24 ENCOUNTER — Other Ambulatory Visit: Payer: Self-pay | Admitting: Neurological Surgery

## 2016-12-10 ENCOUNTER — Encounter (HOSPITAL_COMMUNITY): Payer: Self-pay

## 2016-12-10 NOTE — Pre-Procedure Instructions (Signed)
DAYLN TUGWELL  12/10/2016      [facility] ([facility] [place] - [address]  ([place]) [place] [address] Phone: [phone] Fax: [phone]  [facility] [place], [place] [address] Phone: [phone] Fax: [phone]  [place], [place] [address] Phone: [phone] Fax: [phone]    Your procedure is scheduled on December 21  Report to [place] at [place] A.M.  Call this number if you have problems the morning of surgery:  [phone]   Remember:  Do not eat food or drink liquids after midnight.   Take these medicines the morning of surgery with A SIP OF WATER gabapentin (NEURONTIN), HYDROcodone-acetaminophen (NORCO/VICODIN, omeprazole (PRILOSEC), sertraline (ZOLOFT)   7 days prior to surgery STOP taking any Aspirin, Aleve, Naproxen, Ibuprofen, Motrin, Advil, Goody's, BC's, all herbal medications, fish oil, and all vitamins    Do not wear jewelry.  Do not wear lotions, powders, or cologne, or deoderant.  Men may shave face and neck.  Do not bring valuables to the hospital.  [facility] is not responsible for any belongings or valuables.  Contacts, dentures or bridgework may not be worn into surgery.  Leave your suitcase in the car.  After surgery it may be brought to your room.  For patients admitted to the hospital, discharge time will be determined by your treatment team.  Patients discharged the day of surgery will not be allowed to drive home.    Special instructions:   - Preparing For Surgery  Before surgery, you can play an important role. Because skin is not sterile, your skin needs to be as free of germs as possible. You can reduce the number of germs on your skin by washing with CHG (chlorahexidine  gluconate) Soap before surgery.  CHG is an antiseptic cleaner which kills germs and bonds with the skin to continue killing germs even after washing.  Please do not use if you have an allergy to CHG or antibacterial soaps. If your skin becomes reddened/irritated stop using the CHG.  Do not shave (including legs and underarms) for at least 48 hours prior to first CHG shower. It is OK to shave your face.  Please follow these instructions carefully.   1. Shower the NIGHT BEFORE SURGERY and the MORNING OF SURGERY with CHG.   2. If you chose to wash your hair, wash your hair first as usual with your normal shampoo.  3. After you shampoo, rinse your hair and body thoroughly to remove the shampoo.  4. Use CHG as you would any other liquid soap. You can apply CHG directly to the skin and wash gently with a scrungie or a clean washcloth.   5. Apply the CHG Soap to your body ONLY FROM THE NECK DOWN.  Do not use on open wounds or open sores. Avoid contact with your eyes, ears, mouth and genitals (private parts). Wash genitals (private parts) with your normal soap.  6. Wash thoroughly, paying special attention to the area where your surgery will be performed.  7. Thoroughly rinse your body with warm water from the neck down.  8. DO NOT shower/wash with your normal soap after using and rinsing off the CHG Soap.  9. Pat yourself dry with a CLEAN TOWEL.   10. Wear CLEAN PAJAMAS  11. Place CLEAN SHEETS on your bed the night of your first shower and DO NOT SLEEP WITH PETS.    Day of Surgery: Do not apply any deodorants/lotions. Please wear clean clothes to the hospital/surgery center.      Please read over the following fact sheets that you were given.

## 2016-12-11 ENCOUNTER — Encounter (HOSPITAL_COMMUNITY): Payer: Self-pay

## 2016-12-11 ENCOUNTER — Other Ambulatory Visit: Payer: Self-pay | Admitting: Neurological Surgery

## 2016-12-11 ENCOUNTER — Ambulatory Visit
Admission: RE | Admit: 2016-12-11 | Discharge: 2016-12-11 | Disposition: A | Discharge status: Home / Self Care | Payer: Medicare Other | Source: Ambulatory Visit | Attending: Neurological Surgery | Admitting: Neurological Surgery

## 2016-12-11 ENCOUNTER — Encounter (HOSPITAL_COMMUNITY)
Admission: RE | Admit: 2016-12-11 | Discharge: 2016-12-11 | Disposition: A | Discharge status: Home / Self Care | Payer: Medicare Other | Source: Ambulatory Visit | Attending: Neurological Surgery | Admitting: Neurological Surgery

## 2016-12-11 ENCOUNTER — Ambulatory Visit (HOSPITAL_COMMUNITY)
Admission: RE | Admit: 2016-12-11 | Discharge: 2016-12-11 | Disposition: A | Discharge status: Home / Self Care | Payer: Medicare Other | Source: Ambulatory Visit | Attending: Neurological Surgery | Admitting: Neurological Surgery

## 2016-12-11 DIAGNOSIS — Z01818 Encounter for other preprocedural examination: Secondary | ICD-10-CM | POA: Insufficient documentation

## 2016-12-11 DIAGNOSIS — R918 Other nonspecific abnormal finding of lung field: Secondary | ICD-10-CM

## 2016-12-11 DIAGNOSIS — M4316 Spondylolisthesis, lumbar region: Secondary | ICD-10-CM | POA: Insufficient documentation

## 2016-12-11 DIAGNOSIS — M4310 Spondylolisthesis, site unspecified: Secondary | ICD-10-CM

## 2016-12-11 DIAGNOSIS — K449 Diaphragmatic hernia without obstruction or gangrene: Secondary | ICD-10-CM | POA: Diagnosis not present

## 2016-12-11 DIAGNOSIS — Z01812 Encounter for preprocedural laboratory examination: Secondary | ICD-10-CM | POA: Diagnosis not present

## 2016-12-11 HISTORY — DX: Gastro-esophageal reflux disease without esophagitis: K21.9

## 2016-12-11 HISTORY — DX: Sleep apnea, unspecified: G47.30

## 2016-12-11 LAB — SURGICAL PCR SCREEN
MRSA, PCR: NEGATIVE
Staphylococcus aureus: NEGATIVE

## 2016-12-11 LAB — CBC WITH DIFFERENTIAL/PLATELET
Basophils Absolute: 0.1 10*3/uL (ref 0.0–0.1)
Basophils Relative: 1 %
EOS ABS: 0.3 10*3/uL (ref 0.0–0.7)
EOS PCT: 3 %
HCT: 42.1 % (ref 39.0–52.0)
HEMOGLOBIN: 13.8 g/dL (ref 13.0–17.0)
LYMPHS ABS: 1.6 10*3/uL (ref 0.7–4.0)
Lymphocytes Relative: 19 %
MCH: 30.1 pg (ref 26.0–34.0)
MCHC: 32.8 g/dL (ref 30.0–36.0)
MCV: 91.7 fL (ref 78.0–100.0)
Monocytes Absolute: 1.1 10*3/uL — ABNORMAL HIGH (ref 0.1–1.0)
Monocytes Relative: 13 %
NEUTROS PCT: 64 %
Neutro Abs: 5.2 10*3/uL (ref 1.7–7.7)
Platelets: 254 10*3/uL (ref 150–400)
RBC: 4.59 MIL/uL (ref 4.22–5.81)
RDW: 13.9 % (ref 11.5–15.5)
WBC: 8.2 10*3/uL (ref 4.0–10.5)

## 2016-12-11 LAB — BASIC METABOLIC PANEL
Anion gap: 10 (ref 5–15)
BUN: 11 mg/dL (ref 6–20)
CHLORIDE: 104 mmol/L (ref 101–111)
CO2: 25 mmol/L (ref 22–32)
CREATININE: 0.93 mg/dL (ref 0.61–1.24)
Calcium: 8.9 mg/dL (ref 8.9–10.3)
GFR calc Af Amer: >60 mL/min (ref >60)
GFR calc non Af Amer: >60 mL/min (ref >60)
Glucose, Bld: 95 mg/dL (ref 65–99)
Potassium: 4.2 mmol/L (ref 3.5–5.1)
Sodium: 139 mmol/L (ref 135–145)

## 2016-12-11 LAB — PROTIME-INR
INR: 0.91
PROTHROMBIN TIME: 12.3 s (ref 11.4–15.2)

## 2016-12-11 LAB — ABO/RH: ABO/RH(D): O NEG

## 2016-12-11 LAB — TYPE AND SCREEN
ABO/RH(D): O NEG
Antibody Screen: NEGATIVE

## 2016-12-11 MED ORDER — CHLORHEXIDINE GLUCONATE CLOTH 2 % EX PADS: 6.0000 | MEDICATED_PAD | Freq: Once | CUTANEOUS | Status: DC

## 2016-12-11 MED ORDER — IOPAMIDOL (ISOVUE-300) INJECTION 61%
75.0000 mL | Freq: Once | INTRAVENOUS | Status: AC | PRN
  Administered 2016-12-11: 75 mL via INTRAVENOUS

## 2016-12-11 NOTE — Progress Notes (Signed)
PCP: Dr. Antony Contras, [facility] Pt saw Dr. Irish Lack in 2015 for chest pain. Pt denies cardiac history or chest pain since.   ECHO: 11/12/14 EKG: requested from Dr. Trula Ore with [facility]  Sleep study requested from Dr. Isidoro Donning with [facility]. Pt states he has not gotten a CPAP yet.   Pt denies signs of infection, SOB or chest pain at PAT appointment.

## 2016-12-17 ENCOUNTER — Inpatient Hospital Stay (HOSPITAL_COMMUNITY): Admission: RE | Admit: 2016-12-17 | Payer: Medicare Other | Source: Ambulatory Visit | Admitting: Neurological Surgery

## 2016-12-17 ENCOUNTER — Encounter (HOSPITAL_COMMUNITY): Admission: RE | Payer: Self-pay | Source: Ambulatory Visit

## 2016-12-17 SURGERY — FOR MAXIMUM ACCESS (MAS) POSTERIOR LUMBAR INTERBODY FUSION (PLIF) 1 LEVEL
Anesthesia: General | Site: Back

## 2016-12-29 ENCOUNTER — Telehealth: Payer: Self-pay | Admitting: Internal Medicine

## 2016-12-29 ENCOUNTER — Ambulatory Visit (HOSPITAL_BASED_OUTPATIENT_CLINIC_OR_DEPARTMENT_OTHER): Payer: Medicare Other | Admitting: Internal Medicine

## 2016-12-29 ENCOUNTER — Encounter: Payer: Self-pay | Admitting: Internal Medicine

## 2016-12-29 DIAGNOSIS — Z800 Family history of malignant neoplasm of digestive organs: Secondary | ICD-10-CM

## 2016-12-29 DIAGNOSIS — I10 Essential (primary) hypertension: Secondary | ICD-10-CM | POA: Diagnosis not present

## 2016-12-29 DIAGNOSIS — G8929 Other chronic pain: Secondary | ICD-10-CM

## 2016-12-29 DIAGNOSIS — M545 Low back pain, unspecified: Secondary | ICD-10-CM

## 2016-12-29 DIAGNOSIS — R911 Solitary pulmonary nodule: Secondary | ICD-10-CM | POA: Insufficient documentation

## 2016-12-29 DIAGNOSIS — R918 Other nonspecific abnormal finding of lung field: Secondary | ICD-10-CM

## 2016-12-29 DIAGNOSIS — Z87891 Personal history of nicotine dependence: Secondary | ICD-10-CM

## 2016-12-29 HISTORY — DX: Low back pain, unspecified: M54.50

## 2016-12-29 HISTORY — DX: Other nonspecific abnormal finding of lung field: R91.8

## 2016-12-29 NOTE — Progress Notes (Signed)
[place] [phone]   [phone]  CONSULT NOTE  REFERRING PHYSICIAN: Dr. Sherley Bounds  REASON FOR CONSULTATION:  71 years old white male with highly suspicious lung cancer.  HPI Paul Valdez is a 71 y.o. male with past medical history significant for osteoarthritis, depression, hypertension, GERD, dyslipidemia, sleep apnea, glaucoma as well as long history of smoking but quit in 2000. The patient was complaining of low back pain for 2 years. He underwent MRI of the lumbar spine on 10/21/2016 and it showed shallow extraforaminal and far lateral disc protrusion on the left at L3-4 with potential irritation of the extra foraminal L3 nerve root. He was considered for surgical intervention and preoperative chest x-ray was performed on 12/11/2016 and it showed 4.0 cm left upper lobe pulmonary mass like opacity concerning for lung cancer. This was followed by CT scan of the chest with contrast on 12/11/2016 and it showed left upper lobe mass measuring 4.3 x 3.4 x 3.8 cm. There was also mildly enlarged right hilar lymph node with short axis diameter of 0.9 cm. No enlarged mediastinal or left hilar lymph nodes. Dr. Ronnald Ramp kindly referred the patient to me today for further evaluation and recommendation regarding his condition. When seen today the patient is very anxious and he has shortness of breath at baseline and increased with exertion as well as left sided chest pain very similar to previous history of pleurisy. He also has low back pain and currently on hydrocodone and gabapentin. He has a strong family history of colon cancer and he has screening colonoscopy every 3 years under the care of Dr. Amedeo Plenty. He denied having any weight loss or night sweats. He has no headache or blurry vision. He denied having any nausea, vomiting, diarrhea or constipation. Family history significant for father, brother and 2 sisters with colon cancer. Mother had hypertension. The  patient is single and has no children. He was accompanied by his friend Paul Valdez. He is currently retired and used to work for [employer]. He has a history of smoking 1 pack per day for around 30 years and quit in 2000. 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 No history of drug abuse.  HPI  Past Medical History:  Diagnosis Date  . Arthritis   . Asthma   . Cataract   . Depression   . GERD (gastroesophageal reflux disease)    "I take heart burn medicine"  . Glaucoma   . Hyperlipemia   . Hypertension   . Sleep apnea    has not gotten CPAP yet    Past Surgical History:  Procedure Laterality Date  . APPENDECTOMY    . COLONOSCOPY    . EYE SURGERY    . GANGLION CYST EXCISION     left hand  . HERNIA REPAIR     double hernia repair  . TUMOR REMOVAL     non cancer tumor from neck    Family History  Problem Relation Age of Onset  . Colon cancer Father   . Hypertension Father   . Hypertension Mother   . Colon cancer Sister   . Colon cancer Brother     Social History Social History  Substance Use Topics  . Smoking status: Former Research scientist (life sciences)  . Smokeless tobacco: Never Used  . Alcohol use No    No Known Allergies  Current Outpatient Prescriptions  Medication Sig Dispense Refill  . atorvastatin (LIPITOR) 40 MG tablet Take 40 mg  by mouth every evening.   2  . cyanocobalamin (,VITAMIN B-12,) 1000 MCG/ML injection Inject 1,000 mcg into the muscle every 30 (thirty) days.    Marland Kitchen gabapentin (NEURONTIN) 300 MG capsule Take 600 mg by mouth 3 (three) times daily.    Marland Kitchen HYDROcodone-acetaminophen (NORCO/VICODIN) 5-325 MG tablet Take 2 tablets by mouth at bedtime as needed for moderate pain or severe pain.    Marland Kitchen lisinopril (PRINIVIL,ZESTRIL) 40 MG tablet Take 40 mg by mouth daily.    . nitroGLYCERIN (NITROSTAT) 0.4 MG SL tablet Place 1 tablet (0.4 mg total) under the tongue every 5 (five) minutes as needed for chest pain. 30 tablet 0  . omeprazole (PRILOSEC) 20  MG capsule Take 20 mg by mouth every evening.   2  . sertraline (ZOLOFT) 100 MG tablet Take 100 mg by mouth daily.    . Vitamin D, Ergocalciferol, (DRISDOL) 50000 units CAPS capsule Take 50,000 Units by mouth every 7 (seven) days. Every Friday     No current facility-administered medications for this visit.     Review of Systems  Constitutional: positive for fatigue Eyes: negative Ears, nose, mouth, throat, and face: negative Respiratory: positive for dyspnea on exertion and pleurisy/chest pain Cardiovascular: negative Gastrointestinal: negative Genitourinary:negative Integument/breast: negative Hematologic/lymphatic: negative Musculoskeletal:positive for back pain Neurological: negative Behavioral/Psych: negative Endocrine: negative Allergic/Immunologic: negative  Physical Exam  JAS:NKNLZ, healthy, no distress, well nourished, well developed and anxious SKIN: skin color, texture, turgor are normal, no rashes or significant lesions HEAD: Normocephalic, No masses, lesions, tenderness or abnormalities EYES: normal, PERRLA, Conjunctiva are pink and non-injected EARS: External ears normal, Canals clear OROPHARYNX:no exudate, no erythema and lips, buccal mucosa, and tongue normal  NECK: supple, no adenopathy, no JVD LYMPH:  no palpable lymphadenopathy, no hepatosplenomegaly LUNGS: clear to auscultation , and palpation HEART: regular rate & rhythm, no murmurs and no gallops ABDOMEN:abdomen soft, non-tender, normal bowel sounds and no masses or organomegaly BACK: Back symmetric, no curvature., No CVA tenderness EXTREMITIES:no joint deformities, effusion, or inflammation, no edema, no skin discoloration  NEURO: alert & oriented x 3 with fluent speech, no focal motor/sensory deficits  PERFORMANCE STATUS: ECOG 1  LABORATORY DATA: Lab Results  Component Value Date   WBC 8.2 12/11/2016   HGB 13.8 12/11/2016   HCT 42.1 12/11/2016   MCV 91.7 12/11/2016   PLT 254 12/11/2016       Chemistry      Component Value Date/Time   NA 139 12/11/2016 1049   K 4.2 12/11/2016 1049   CL 104 12/11/2016 1049   CO2 25 12/11/2016 1049   BUN 11 12/11/2016 1049   CREATININE 0.93 12/11/2016 1049      Component Value Date/Time   CALCIUM 8.9 12/11/2016 1049       RADIOGRAPHIC STUDIES: Chest 2 View  Result Date: 12/11/2016 CLINICAL DATA:  Preop chest.  Pneumonia, shortness of breath. EXAM: CHEST  2 VIEW COMPARISON:  04/10/2013 FINDINGS: There is a large hiatal hernia. Eventration of the right hemidiaphragm. Heart is normal size. Rounded mass is noted anteriorly in the left upper lobe measuring 4 cm concerning for possible lung cancer. No confluent opacity on the right. No pleural effusions or acute bony abnormality. IMPRESSION: 4 cm left upper lobe pulmonary mass like opacity concerning for lung cancer. Recommend further evaluation with chest CT, preferably with IV contrast. Large hiatal hernia. These results will be called to the ordering clinician or representative by the Radiologist Assistant, and communication documented in the PACS or zVision Dashboard. Electronically Signed  By: Rolm Baptise M.D.   On: 12/11/2016 12:45   Ct Chest W Contrast  Result Date: 12/11/2016 CLINICAL DATA:  Left upper lobe lung mass on chest radiographs earlier today. EXAM: CT CHEST WITH CONTRAST TECHNIQUE: Multidetector CT imaging of the chest was performed during intravenous contrast administration. CONTRAST:  21m ISOVUE-300 IOPAMIDOL (ISOVUE-300) INJECTION 61% COMPARISON:  Previous chest radiographs, including earlier today. FINDINGS: Cardiovascular: Aortic and coronary artery calcifications. Left atrial enlargement. Mediastinum/Nodes: Large hiatal hernia with sliding and paraesophageal components. Mildly enlarged right hilar lymph node with a short axis diameter of 9 mm on image number 66 of series 2. No enlarged mediastinal or left hilar nodes. No axillary adenopathy. Unremarkable thyroid gland.  Lungs/Pleura: Left upper lobe mass extending to the lower of. The mass is oval with mild irregularity. This measures 4.3 x 3.4 cm on image number 38 of series 5. This measures 3.8 cm in length on coronal image number 62. No other lung masses or nodules. Diffuse peribronchial thickening. No pleural fluid. Bilateral bullous changes. Upper Abdomen: Large right diaphragmatic eventration. There is also a right posterior diaphragmatic hernia containing herniated fat in the posterior right lower thorax. Normal appearing adrenal glands. Small liver cysts. The largest is in the lateral segment of the left lobe, measuring 9 mm in maximum diameter. Musculoskeletal: Mild thoracic spine degenerative changes. No evidence of bony metastatic disease. IMPRESSION: 1. 4.3 x 3.8 x 3.4 cm left upper lobe mass with imaging features compatible with a primary lung carcinoma. 2. Changes of COPD and chronic bronchitis with paraseptal emphysema. 3. Large right diaphragmatic eventration and right posterior diaphragmatic hernia with a moderate amount of herniated fat. 4. Large hiatal hernia with sliding and paraesophageal components. 5. Coronary artery atherosclerosis and aortic atherosclerosis. Electronically Signed   By: SClaudie ReveringM.D.   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   PLAN: I had a lengthy discussion with the patient and his friend today about his current condition and further investigation to confirm the diagnosis and treatment options. I personally and independently reviewed the scan images and discuss the results and showed the images to the patient today. I recommended for the patient to have a PET scan as well as MRI of the brain to rule out any metastatic disease. I will also arrange for the patient to have pulmonary function  test. If there is no evidence of metastatic disease and his per minute function test are appropriate, we will arrange for the patient to see cardiothoracic surgery for consideration of surgical resection. I will arrange for the patient to come back for follow-up visit at the multidisciplinary thoracic oncology clinic in less than 2 weeks for more detailed discussion of his treatment option based on the final staging workup and pulmonary function test. For the low back pain, the patient will continue his current treatment with hydrocodone and gabapentin. He was advised to call immediately if he has any concerning symptoms in the interval.  The patient voices understanding of current disease status and treatment options and is in agreement with the current care plan.  All questions were answered. The patient knows to call the clinic with any problems, questions or concerns. We can certainly see the patient much sooner if necessary.  Thank you so much for allowing me to participate in the  care of OBINNA EHRESMAN. I will continue to follow up the patient with you and assist in his care.  I spent 40 minutes counseling the patient face to face. The total time spent in the appointment was 60 minutes.  Disclaimer: This note was dictated with voice recognition software. Similar sounding words can inadvertently be transcribed and may not be corrected upon review.   Lylah Lantis K. December 29, 2016, 2:52 PM

## 2016-12-29 NOTE — Telephone Encounter (Signed)
PFT scheduled for Friday 01/01/17 @ 10:00 a.m. No smoking, No Caffeine, No Breathing Medications/Inhaler, 4 hours prior to appointment. Patient was made aware. Message sent to [facility] regarding prior authorization for MRI and PET. Hinton Dyer will contact patient regarding [place] appointment, per 12/29/16 los

## 2016-12-30 ENCOUNTER — Telehealth: Payer: Self-pay | Admitting: *Deleted

## 2016-12-30 ENCOUNTER — Encounter: Payer: Self-pay | Admitting: *Deleted

## 2016-12-30 NOTE — Telephone Encounter (Signed)
Oncology Nurse Navigator Documentation  Oncology Nurse Navigator Flowsheets 12/30/2016  Navigator Location [facility]  Navigator Encounter Type Telephone/[facility] notified me of appt for patient. I called patient and updated him.  I also sent the location on [facility] office map in the mail today.    Telephone Outgoing Call  Treatment Phase Pre-Tx/Tx Discussion  Barriers/Navigation Needs Education;Coordination of Care  Education Other  Interventions Education;Coordination of Care  Coordination of Care Appts  Education Method Verbal;Written  Acuity Level 2  Acuity Level 2 Educational needs;Assistance expediting appointments  Time Spent with Patient 30

## 2016-12-30 NOTE — Telephone Encounter (Signed)
Oncology Nurse Navigator Documentation  Oncology Nurse Navigator Flowsheets 12/30/2016  Navigator Location [facility]  Navigator Encounter Type Telephone  Telephone Outgoing Call/I called central scheduling and requested an earlier time.  I then called Mr. Mazariego with another appt time and place.  I will send a map of [facility] to help him with location of scan.  He was thankful for the help.  I also notified [facility] on patient's referral and requested an appt.   Treatment Phase Pre-Tx/Tx Discussion  Barriers/Navigation Needs Coordination of Care;Education  Education Other  Interventions Coordination of Care;Education  Coordination of Care Appts;Radiology  Education Method Verbal;Written  Acuity Level 2  Acuity Level 2 Educational needs;Other  Time Spent with Patient 45

## 2016-12-30 NOTE — Progress Notes (Signed)
Oncology Nurse Navigator Documentation  Oncology Nurse Navigator Flowsheets 12/30/2016  Navigator Location [facility]  Navigator Encounter Type Other/notified pre cert team of PET scan request.    Treatment Phase Pre-Tx/Tx Discussion  Barriers/Navigation Needs Coordination of Care  Interventions Coordination of Care  Coordination of Care Other;Appts  Acuity Level 2  Acuity Level 2 Other  Time Spent with Patient 30

## 2016-12-30 NOTE — Telephone Encounter (Signed)
Oncology Nurse Navigator Documentation  Oncology Nurse Navigator Flowsheets 12/30/2016  Navigator Location [facility]  Navigator Encounter Type Other/I received notification that PET has been authorized.  I called central scheduling.  I called patient and gave him appt for 01/08/17 arrive at 12:30 and nothing by mouth 6 hours prior to exam.  Patient would like PET scan sooner. I will follow up with central scheduling.    Treatment Phase Pre-Tx/Tx Discussion  Barriers/Navigation Needs Coordination of Care  Interventions Coordination of Care  Coordination of Care Other;Appts  Acuity Level 2  Acuity Level 2 Other  Time Spent with Patient 30

## 2016-12-31 ENCOUNTER — Encounter: Payer: Self-pay | Admitting: Internal Medicine

## 2017-01-01 ENCOUNTER — Ambulatory Visit (HOSPITAL_COMMUNITY)
Admission: RE | Admit: 2017-01-01 | Discharge: 2017-01-01 | Disposition: A | Discharge status: Home / Self Care | Payer: Medicare Other | Source: Ambulatory Visit | Attending: Internal Medicine | Admitting: Internal Medicine

## 2017-01-01 DIAGNOSIS — R918 Other nonspecific abnormal finding of lung field: Secondary | ICD-10-CM | POA: Insufficient documentation

## 2017-01-01 LAB — PULMONARY FUNCTION TEST
DL/VA % pred: 72 %
DL/VA: 3.25 ml/min/mmHg/L
DLCO UNC % PRED: 64 %
DLCO unc: 19.16 ml/min/mmHg
FEF 25-75 PRE: 4.18 L/s
FEF 25-75 Post: 4.38 L/sec
FEF2575-%Change-Post: 4 %
FEF2575-%Pred-Post: 195 %
FEF2575-%Pred-Pre: 186 %
FEV1-%CHANGE-POST: 2 %
FEV1-%PRED-POST: 118 %
FEV1-%Pred-Pre: 115 %
FEV1-POST: 3.53 L
FEV1-PRE: 3.43 L
FEV1FVC-%Change-Post: 0 %
FEV1FVC-%Pred-Pre: 115 %
FEV6-%CHANGE-POST: 3 %
FEV6-%PRED-POST: 109 %
FEV6-%Pred-Pre: 106 %
FEV6-PRE: 4.05 L
FEV6-Post: 4.17 L
FEV6FVC-%Change-Post: 0 %
FEV6FVC-%PRED-POST: 106 %
FEV6FVC-%PRED-PRE: 105 %
FVC-%Change-Post: 2 %
FVC-%Pred-Post: 102 %
FVC-%Pred-Pre: 100 %
FVC-POST: 4.17 L
FVC-PRE: 4.08 L
PRE FEV1/FVC RATIO: 84 %
Post FEV1/FVC ratio: 85 %
Post FEV6/FVC ratio: 100 %
Pre FEV6/FVC Ratio: 99 %
RV % PRED: 77 %
RV: 1.84 L
TLC % PRED: 97 %
TLC: 6.45 L

## 2017-01-01 MED ORDER — ALBUTEROL SULFATE (2.5 MG/3ML) 0.083% IN NEBU
2.5000 mg | INHALATION_SOLUTION | Freq: Once | RESPIRATORY_TRACT | Status: AC
  Administered 2017-01-01: 2.5 mg via RESPIRATORY_TRACT

## 2017-01-04 ENCOUNTER — Telehealth: Payer: Self-pay | Admitting: Medical Oncology

## 2017-01-04 ENCOUNTER — Ambulatory Visit (HOSPITAL_COMMUNITY)
Admission: RE | Admit: 2017-01-04 | Discharge: 2017-01-04 | Disposition: A | Discharge status: Home / Self Care | Payer: Medicare Other | Source: Ambulatory Visit | Attending: Internal Medicine | Admitting: Internal Medicine

## 2017-01-04 DIAGNOSIS — K5730 Diverticulosis of large intestine without perforation or abscess without bleeding: Secondary | ICD-10-CM | POA: Insufficient documentation

## 2017-01-04 DIAGNOSIS — N200 Calculus of kidney: Secondary | ICD-10-CM | POA: Insufficient documentation

## 2017-01-04 DIAGNOSIS — I739 Peripheral vascular disease, unspecified: Secondary | ICD-10-CM | POA: Insufficient documentation

## 2017-01-04 DIAGNOSIS — R918 Other nonspecific abnormal finding of lung field: Secondary | ICD-10-CM | POA: Diagnosis not present

## 2017-01-04 DIAGNOSIS — J439 Emphysema, unspecified: Secondary | ICD-10-CM | POA: Insufficient documentation

## 2017-01-04 LAB — GLUCOSE, CAPILLARY: Glucose-Capillary: 103 mg/dL — ABNORMAL HIGH (ref 65–99)

## 2017-01-04 MED ORDER — FLUDEOXYGLUCOSE F - 18 (FDG) INJECTION
10.5100 | Freq: Once | INTRAVENOUS | Status: AC | PRN
  Administered 2017-01-04: 10.51 via INTRAVENOUS

## 2017-01-04 MED ORDER — GADOBENATE DIMEGLUMINE 529 MG/ML IV SOLN
20.0000 mL | Freq: Once | INTRAVENOUS | Status: AC | PRN
  Administered 2017-01-04: 20 mL via INTRAVENOUS

## 2017-01-04 NOTE — Telephone Encounter (Signed)
I called and cancelled PET scan at [place].Pt wants results of scans asap. Does his hiatal hernia affect his lung capacity and should he have surgery for it. Note to [place].

## 2017-01-04 NOTE — Telephone Encounter (Signed)
-----   Message from Curt Bears, MD sent at 01/04/2017  3:02 PM EST ----- Regarding: RE: mri , pet results PET scan showed active pulmonary mass, MRI is negative. He is seeing the cardiothoracic surgeon in 2 days. He can discuss the results in details with him. ----- Message ----- From: Ardeen Garland, RN Sent: 01/04/2017   1:50 PM To: Gennaro Africa, MD, Curt Bears, MD Subject: mri , pet results                              Wants to know results as soon as available because he has no follow up.

## 2017-01-04 NOTE — Telephone Encounter (Signed)
Pt.notified

## 2017-01-06 ENCOUNTER — Institutional Professional Consult (permissible substitution) (INDEPENDENT_AMBULATORY_CARE_PROVIDER_SITE_OTHER): Payer: Medicare Other | Admitting: Surgery

## 2017-01-06 VITALS — BP 130/87 | HR 73 | Resp 20 | Ht 68.0 in | Wt 215.0 lb

## 2017-01-06 DIAGNOSIS — R918 Other nonspecific abnormal finding of lung field: Secondary | ICD-10-CM | POA: Diagnosis not present

## 2017-01-07 ENCOUNTER — Encounter: Payer: Self-pay | Admitting: Internal Medicine

## 2017-01-08 ENCOUNTER — Encounter: Payer: Self-pay | Admitting: Surgery

## 2017-01-08 ENCOUNTER — Encounter (HOSPITAL_COMMUNITY): Payer: Medicare Other

## 2017-01-08 ENCOUNTER — Telehealth: Payer: Self-pay | Admitting: *Deleted

## 2017-01-08 NOTE — Telephone Encounter (Signed)
Oncology Nurse Navigator Documentation  Oncology Nurse Navigator Flowsheets 01/08/2017  Navigator Location [facility]  Navigator Encounter Type Telephone  Telephone Outgoing Call/I called to follow up with patient after his visit with Dr. Cyndia Bent.  I listened as he explained.  He has an appt with cardiology next week to get cleared for surgery.  I asked him to call if he had further questions   Treatment Phase Pre-Tx/Tx Discussion  Barriers/Navigation Needs Education  Education Other  Interventions Education  Education Method Verbal  Acuity Level 1  Acuity Level 2 Educational needs  Time Spent with Patient 30

## 2017-01-08 NOTE — Progress Notes (Signed)
Cardiothoracic Surgery Consultation   PCP is Gara Kroner, MD Referring Provider is Curt Bears, MD  Chief Complaint  Patient presents with  . Lung Lesion    Surgical eval, PET Scan 01/04/17, Brain MRA 01/04/17, PFT'S 01/01/17, Chest CT 12/11/16    HPI:  The patient is a 71 year old previous heavy smoker with hypertension, hyperlipidemia, osteoarthritis, OSA and depression who has been having low back pain for two years and had an MRI in October 2017 showing lumbar spine disease. He was being worked up for spine surgery and a CXR showed a 4 cm LUL lung mass. CT of the chest on 12/11/2016 showed a 4.3 x 3.4 x 3.8 cm mass suspicious for lung cancer. There was a large right diaphragmatic eventration and right posterior diaphragmatic hernia. There was a large hiatal hernia with sliding and paraesophageal components as well as coronary and aortic atherosclerosis. He reports some shortness of breath at rest that is increased with exertion. He feels like this is because he has not been very active due to his back pain. He denies any cough or sputum production. He has been eating well and has had no weight loss.  He is single and has no children. His friend is with him today. He is retired. He reports a 30 pk-year smoking history but quit in 2000. 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  Past Medical History:  Diagnosis Date  . Arthritis   . Asthma   . Cataract   . Depression   . GERD (gastroesophageal reflux disease)    "I take heart burn medicine"  . Glaucoma   . Hyperlipemia   . Hypertension   . Low back pain 12/29/2016  . Mass of upper lobe of left lung 12/29/2016  . Sleep apnea    has not gotten CPAP yet    Past Surgical History:  Procedure Laterality Date  . APPENDECTOMY    . COLONOSCOPY    . EYE SURGERY    . GANGLION CYST EXCISION     left hand  . HERNIA REPAIR     double hernia repair  . TUMOR REMOVAL     non cancer tumor from neck    Family  History  Problem Relation Age of Onset  . Colon cancer Father   . Hypertension Father   . Hypertension Mother   . Colon cancer Sister   . Colon cancer Brother     Social History Social History  Substance Use Topics  . Smoking status: Former Research scientist (life sciences)  . Smokeless tobacco: Never Used  . Alcohol use No    Current Outpatient Prescriptions  Medication Sig Dispense Refill  . atorvastatin (LIPITOR) 40 MG tablet Take 40 mg by mouth every evening.   2  . cyanocobalamin (,VITAMIN B-12,) 1000 MCG/ML injection Inject 1,000 mcg into the muscle every 30 (thirty) days.    Marland Kitchen gabapentin (NEURONTIN) 300 MG capsule Take 600 mg by mouth 3 (three) times daily.    Marland Kitchen HYDROcodone-acetaminophen (NORCO/VICODIN) 5-325 MG tablet Take 2 tablets by mouth at bedtime as needed for moderate pain or severe pain.    Marland Kitchen lisinopril (PRINIVIL,ZESTRIL) 40 MG tablet Take 40 mg by mouth daily.    . nitroGLYCERIN (NITROSTAT) 0.4 MG SL tablet Place 1 tablet (0.4 mg total) under the tongue every 5 (five) minutes as needed for chest pain. 30 tablet 0  . omeprazole (PRILOSEC) 20 MG capsule Take 20 mg by mouth every evening.   2  .  sertraline (ZOLOFT) 100 MG tablet Take 100 mg by mouth daily.    . Vitamin D, Ergocalciferol, (DRISDOL) 50000 units CAPS capsule Take 50,000 Units by mouth every 7 (seven) days. Every Friday     No current facility-administered medications for this visit.     No Known Allergies  Review of Systems  Constitutional: Positive for fatigue. Negative for activity change, appetite change, fever and unexpected weight change.  HENT: Negative.   Eyes: Negative.   Respiratory: Positive for chest tightness and shortness of breath. Negative for cough.   Cardiovascular: Positive for chest pain. Negative for palpitations and leg swelling.  Gastrointestinal: Negative.   Endocrine: Negative.   Genitourinary: Negative.   Musculoskeletal: Positive for back pain.  Allergic/Immunologic: Negative.   Neurological:  Negative.   Hematological: Negative.   Psychiatric/Behavioral: Negative.     BP 130/87   Pulse 73   Resp 20   Ht _0  (1.727 m)   Wt 215 lb (97.5 kg)   SpO2 92% Comment: RA  BMI 32.69 kg/m  Physical Exam  Constitutional: He is oriented to person, place, and time.  Obese gentleman in no distress  HENT:  Head: Normocephalic and atraumatic.  Mouth/Throat: Oropharynx is clear and moist.  Eyes: EOM are normal. Pupils are equal, round, and reactive to light.  Neck: Normal range of motion. Neck supple. No JVD present. No thyromegaly present.  Cardiovascular: Normal rate, regular rhythm, normal heart sounds and intact distal pulses.   No murmur heard. Pulmonary/Chest: Effort normal and breath sounds normal. No respiratory distress.  Abdominal: Soft. Bowel sounds are normal. He exhibits no distension and no mass. There is no tenderness.  Musculoskeletal: Normal range of motion. He exhibits no edema.  Lymphadenopathy:    He has no cervical adenopathy.  Neurological: He is alert and oriented to person, place, and time. He has normal strength. No cranial nerve deficit or sensory deficit.  Skin: Skin is warm and dry.  Psychiatric: He has a normal mood and affect.     Diagnostic Tests:  CT CHEST W CONTRAST (Accession [id number]) (Order [id number])  Imaging  Date: 12/11/2016 Department: [facility] Released By: Adelene Amas Authorizing: Eustace Moore, MD  Exam Information   Status Exam Begun  Exam Ended   Final [99] 12/11/2016 4:54 PM 12/11/2016 4:55 PM  PACS Images   Show images for CT CHEST W CONTRAST  Study Result   CLINICAL DATA:  Left upper lobe lung mass on chest radiographs earlier today.  EXAM: CT CHEST WITH CONTRAST  TECHNIQUE: Multidetector CT imaging of the chest was performed during intravenous contrast administration.  CONTRAST:  64m ISOVUE-300 IOPAMIDOL (ISOVUE-300) INJECTION 61%  COMPARISON:  Previous chest  radiographs, including earlier today.  FINDINGS: Cardiovascular: Aortic and coronary artery calcifications. Left atrial enlargement.  Mediastinum/Nodes: Large hiatal hernia with sliding and paraesophageal components. Mildly enlarged right hilar lymph node with a short axis diameter of 9 mm on image number 66 of series 2. No enlarged mediastinal or left hilar nodes. No axillary adenopathy. Unremarkable thyroid gland.  Lungs/Pleura: Left upper lobe mass extending to the lower of. The mass is oval with mild irregularity. This measures 4.3 x 3.4 cm on image number 38 of series 5. This measures 3.8 cm in length on coronal image number 62.  No other lung masses or nodules. Diffuse peribronchial thickening. No pleural fluid. Bilateral bullous changes.  Upper Abdomen: Large right diaphragmatic eventration. There is also a right posterior diaphragmatic hernia containing  herniated fat in the posterior right lower thorax. Normal appearing adrenal glands. Small liver cysts. The largest is in the lateral segment of the left lobe, measuring 9 mm in maximum diameter.  Musculoskeletal: Mild thoracic spine degenerative changes. No evidence of bony metastatic disease.  IMPRESSION: 1. 4.3 x 3.8 x 3.4 cm left upper lobe mass with imaging features compatible with a primary lung carcinoma. 2. Changes of COPD and chronic bronchitis with paraseptal emphysema. 3. Large right diaphragmatic eventration and right posterior diaphragmatic hernia with a moderate amount of herniated fat. 4. Large hiatal hernia with sliding and paraesophageal components. 5. Coronary artery atherosclerosis and aortic atherosclerosis.   Electronically Signed   By: Claudie Revering M.D.   On: 12/11/2016 17:24    MR Brain W Wo Contrast (Accession [id number]) (Order [id number])  Imaging  Date: 01/04/2017 Department: [facility] Released By: Hardie Pulley Authorizing: Curt Bears, MD  Exam  Information   Status Exam Begun  Exam Ended   Final [99] 01/04/2017 7:53 AM 01/04/2017 8:47 AM  PACS Images   Show images for MR Brain W Wo Contrast  Study Result   CLINICAL DATA:  Staging primary lung cancer. Evaluate for metastatic disease.  EXAM: MRI HEAD WITHOUT AND WITH CONTRAST  TECHNIQUE: Multiplanar, multiecho pulse sequences of the brain and surrounding structures were obtained without and with intravenous contrast.  CONTRAST:  27m MULTIHANCE GADOBENATE DIMEGLUMINE 529 MG/ML IV SOLN  COMPARISON:  PET scan reported separately.  CT chest 12/11/2016.  FINDINGS: Brain: No evidence for acute infarction, hemorrhage, mass lesion, hydrocephalus, or extra-axial fluid. Mild cerebral atrophy. Mild subcortical and periventricular T2 and FLAIR hyperintensities, likely chronic microvascular ischemic change.  Post infusion, no abnormal enhancement of brain or meninges.  Vascular: Flow voids are maintained throughout the carotid, basilar, and vertebral arteries. There are no areas of chronic hemorrhage. Major dural venous sinuses are patent.  Skull and upper cervical spine: Unremarkable visualized calvarium, skullbase, and cervical vertebrae. Pituitary, pineal, cerebellar tonsils unremarkable. No upper cervical cord lesions.  Sinuses/Orbits: No orbital masses or proptosis. Globes appear symmetric. Sinuses appear well aerated, without evidence for air-fluid level.  Other: Large sebaceous cyst, RIGHT posterior frontal scalp.  IMPRESSION: No intracranial metastatic disease is evident.  Mild atrophy and small vessel disease.   Electronically Signed   By: JStaci RighterM.D.   On: 01/04/2017 09:11    NM PET Image Initial (PI) Skull Base To Thigh (Accession [id number] (Order [id number]  Imaging  Date: 01/04/2017 [facility] Released By: KMarquis LunchAuthorizing: MCurt Bears MD  Exam Information    Status Exam Begun  Exam Ended   Final [99] 01/04/2017 10:03 AM 01/04/2017 11:19 AM  PACS Images   Show images for NM PET Image Initial (PI) Skull Base To Thigh  Study Result   CLINICAL DATA:  Initial treatment strategy for left upper lobe mass.  EXAM: NUCLEAR MEDICINE PET SKULL BASE TO THIGH  TECHNIQUE: 10.5 mCi F-18 FDG was injected intravenously. Full-ring PET imaging was performed from the skull base to thigh after the radiotracer. CT data was obtained and used for attenuation correction and anatomic localization.  FASTING BLOOD GLUCOSE:  Value: 103 mg/dl  COMPARISON:  Chest CT dated 12/11/2016  FINDINGS: NECK  No hypermetabolic lymph nodes in the neck.  CHEST  The left upper lobe mass measures 4.3 by 3.3 cm on image 23/8, stable, and extends to the visceral pleural surface, parietal pleural involvement uncertain but no pleural effusion seen.  The masses centrally photopenic but has high signal along its edges, maximum SUV 7.7  No hypermetabolic adenopathy in the chest.  Moderate-sized hiatal hernia. Coronary, aortic arch, and branch vessel atherosclerotic vascular disease.  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 Paraseptal emphysema.  ABDOMEN/PELVIS  Scattered regions likely physiologic bowel activity noted although some of the areas including at the junction of the descending and sigmoid colon have some focally accentuated metabolic activity which could conceivably reflect a small polyp, for example maximum SUV at the junction of the descending and sigmoid colon is 7.8 if there is some focally accentuated activity at the anus without CT correlate, probably physiologic.  Central prostate calcifications noted. Hernia mesh along the lower anterior abdominopelvic wall.  Bilateral nonobstructive nephrolithiasis. Aortoiliac atherosclerotic vascular disease. Sigmoid colon diverticulosis.  SKELETON  No focal hypermetabolic  activity to suggest skeletal metastasis.  IMPRESSION: 1. Hypermetabolic left upper lobe mass, maximum SUV 7.7, favoring malignancy. Central photopenia probably from central necrosis. Abscess or granulomatous infectious process is not totally excluded but significantly less likely. Assuming non-small cell lung cancer and visceral pleural extension, this represents T2a N0 M0 disease (stage IB). 2. There are some focal areas of hypermetabolic activity in the bowel, which are probably physiologic but which could possibly represent polyps. One of these is at the anus and the other is at the junction of the descending and sigmoid colon. 3. Other imaging findings of potential clinical significance: Moderate-sized hiatal hernia. Coronary, aortic arch, and branch vessel atherosclerotic vascular disease. Aortoiliac atherosclerotic vascular disease. Bilateral nonobstructive nephrolithiasis. Hernia mesh along the lower anterior abdominopelvic wall. Paraseptal emphysema. Sigmoid colon diverticulosis.   Electronically Signed   By: Van Clines M.D.   On: 01/04/2017 12:06    Pulmonary Function Test  Order: [id number]  Status:  Edited Result - FINAL Visible to patient:  Yes (MyChart) Next appt:  01/13/2017 at 10:30 AM in Cardiology Larae Grooms, MD) Dx:  Mass of upper lobe of left lung   Ref Range & Units 7d ago  FVC-Pre L 4.08   FVC-%Pred-Pre % 100   FVC-Post L 4.17   FVC-%Pred-Post % 102   FVC-%Change-Post % 2   FEV1-Pre L 3.43   FEV1-%Pred-Pre % 115   FEV1-Post L 3.53   FEV1-%Pred-Post % 118   FEV1-%Change-Post % 2   FEV6-Pre L 4.05   FEV6-%Pred-Pre % 106   FEV6-Post L 4.17   FEV6-%Pred-Post % 109   FEV6-%Change-Post % 3   Pre FEV1/FVC ratio % 84   FEV1FVC-%Pred-Pre % 115   Post FEV1/FVC ratio % 85   FEV1FVC-%Change-Post % 0   Pre FEV6/FVC Ratio % 99   FEV6FVC-%Pred-Pre % 105   Post FEV6/FVC ratio % 100   FEV6FVC-%Pred-Post % 106   FEV6FVC-%Change-Post % 0     FEF 25-75 Pre L/sec 4.18   FEF2575-%Pred-Pre % 186   FEF 25-75 Post L/sec 4.38   FEF2575-%Pred-Post % 195   FEF2575-%Change-Post % 4   RV L 1.84   RV % pred % 77   TLC L 6.45   TLC % pred % 97   DLCO unc ml/min/mmHg 19.16   DLCO unc % pred % 64   DL/VA ml/min/mmHg/L 3.25   DL/VA % pred % 72   Resulting Agency  [facility]    Specimen Collected: 01/01/17 09:49 Last Resulted: 01/01/17 10:59              Scans on Order [id number]   Scan on 01/02/2017 12:40 PM by Tanda Rockers, MDScan  on 01/02/2017 12:40 PM by Tanda Rockers, MD            Impression:  This 71 year old gentleman has a 4.3 cm left upper lobe lung mass that is hypermetabolic on PET scan and highly suspicious for a lung cancer. There is no evidence of metastatic disease on PET scan or MR of the brain. His pulmonary function appears adequate to allow a left upper lobectomy for removal. He has been seen in the past by Dr. Irish Lack and has coronary and aortic atherosclerosis on PET scan so I think preop cardiac clearance is indicated. His EF by echo in 2015 was normal with mild LVH and grade 1 diastolic dysfunction. He was having some amaurosis symptoms at that time. I reviewed his CT and PET scan images with him and discussed my surgical recommendations. I discussed the operative procedure with the patient and his friend including alternatives, benefits and risks; including but not limited to bleeding, blood transfusion, infection, respiratory failure, possible need for postop adjuvant therapy depending on the pathology results, pleural space complications and death.  Colbert Coyer understands and agrees to proceed.    Plan:  He will be referred to Dr. Irish Lack for preop cardiac clearance and then we will schedule flexible bronchoscopy and left thoracotomy for left upper lobectomy.  I spent 60 minutes performing this consultation and > 50% of this time was spent face to face counseling and coordinating the care of this  patient's left upper lobe lung mass.  Gaye Pollack, MD [facility] [phone]

## 2017-01-11 ENCOUNTER — Encounter: Payer: Self-pay | Admitting: Medical Oncology

## 2017-01-13 ENCOUNTER — Encounter: Payer: Self-pay | Admitting: Physician Assistant

## 2017-01-13 ENCOUNTER — Ambulatory Visit: Payer: Medicare Other | Admitting: Interventional Cardiology

## 2017-01-13 DIAGNOSIS — I2510 Atherosclerotic heart disease of native coronary artery without angina pectoris: Secondary | ICD-10-CM | POA: Insufficient documentation

## 2017-01-13 DIAGNOSIS — I10 Essential (primary) hypertension: Secondary | ICD-10-CM | POA: Insufficient documentation

## 2017-01-13 DIAGNOSIS — I739 Peripheral vascular disease, unspecified: Secondary | ICD-10-CM | POA: Insufficient documentation

## 2017-01-13 DIAGNOSIS — E785 Hyperlipidemia, unspecified: Secondary | ICD-10-CM | POA: Insufficient documentation

## 2017-01-13 NOTE — Progress Notes (Deleted)
Cardiology Office Note    Date:  01/13/2017  ID:  Paul Valdez, DOB 1946/05/29, MRN [id number] PCP:  Gara Kroner, MD  Cardiologist:  Dr. Irish Lack   Chief Complaint: pre-op evaluation  History of Present Illness:  Paul Valdez is a 71 y.o. male with history of h/p atypical chest pain (normal nuc 2014), HTN, hyperlipidemia, carotid artery disease, OSA, depression, GERD, detached retina, recovered alcoholism since 1994, former tobacco abuse who presents for pre-op evaluation of lung surgery. Previously saw Dr. Irish Lack for cardiac evaluation of visual changes and patient ultimately ended up seeing an ophthalmologist and having a detached retina repaired. Carotid duplex 2015: 17-51% RICA, 0-25% LICA. 2D echo 10/2014: mild LVH, EF 55-60%, grade 1 DD, mild LAE. Also has h/o Abdominal duplex 2013: mild plaque in iliacs, no AAA. Labs 11/2016: Hgb 13.8, BMET wnl Cr 0.93.  He has been having low back pain for two years and MRI in October 2017 showed lumbar spine disease. He was being worked up for spine surgery and a CXR showed a 4 cm LUL lung mass. CT of the chest on 12/11/2016 showed a 4.3 x 3.4 x 3.8 cm mass suspicious for lung cancer. There was a large right diaphragmatic eventration and right posterior diaphragmatic hernia. There was a large hiatal hernia with sliding and paraesophageal components as well as coronary and aortic atherosclerosis. PET scan showed this to be hypermetabolic with suspicion for non-small cell lung cancer and visceral pleural extension, representing T2a N0 M0 disease. PET also corroborated coronary, aortic arch, and branch vessel atherosclerotic vascular disease as well as aortoiliac atherosclerotic vascular disease. It also showed paraseptal emphysema, and sigmoid diverticulosis. He is referred to [place] for pre-op clearance of flexible bronchoscopy and left thoracotomy for left upper lobectomy.  lipids  Pre-operative cardiovascular evaluation Coronary calcification  seen on CT scan Carotid artery disease PVD seen on CT HTN Hyperlipidemia    Past Medical History:  Diagnosis Date  . Arthritis   . Asthma   . Atypical chest pain    a. Normal nuc 2014.  . Carotid artery disease ([place])    a. Carotid duplex 2015: 85-27% RICA, 7-82% LICA.   Marland Kitchen Cataract   . Coronary artery calcification seen on CT scan   . Depression   . Detached retina   . Former consumption of alcohol   . Former tobacco use   . GERD (gastroesophageal reflux disease)    "I take heart burn medicine"  . Glaucoma   . Hyperlipemia   . Hypertension   . Low back pain 12/29/2016  . Mass of upper lobe of left lung 12/29/2016  . PVD (peripheral vascular disease) ([place])    a. Mild plaque of iliacs in 2013 on duplex; PET 2018:  PET also corroborated coronary, aortic arch, and branch vessel atherosclerotic vascular disease as well as aortoiliac atherosclerotic vascular disease.  . Sleep apnea    has not gotten CPAP yet    Past Surgical History:  Procedure Laterality Date  . APPENDECTOMY    . COLONOSCOPY    . EYE SURGERY    . GANGLION CYST EXCISION     left hand  . HERNIA REPAIR     double hernia repair  . TUMOR REMOVAL     non cancer tumor from neck    Current Medications: Current Outpatient Prescriptions  Medication Sig Dispense Refill  . atorvastatin (LIPITOR) 40 MG tablet Take 40 mg by mouth every evening.   2  . cyanocobalamin (,VITAMIN B-12,) 1000  MCG/ML injection Inject 1,000 mcg into the muscle every 30 (thirty) days.    Marland Kitchen gabapentin (NEURONTIN) 300 MG capsule Take 600 mg by mouth 3 (three) times daily.    Marland Kitchen HYDROcodone-acetaminophen (NORCO/VICODIN) 5-325 MG tablet Take 2 tablets by mouth at bedtime as needed for moderate pain or severe pain.    Marland Kitchen lisinopril (PRINIVIL,ZESTRIL) 40 MG tablet Take 40 mg by mouth daily.    . nitroGLYCERIN (NITROSTAT) 0.4 MG SL tablet Place 1 tablet (0.4 mg total) under the tongue every 5 (five) minutes as needed for chest pain. 30 tablet 0  .  omeprazole (PRILOSEC) 20 MG capsule Take 20 mg by mouth every evening.   2  . sertraline (ZOLOFT) 100 MG tablet Take 100 mg by mouth daily.    . Vitamin D, Ergocalciferol, (DRISDOL) 50000 units CAPS capsule Take 50,000 Units by mouth every 7 (seven) days. Every Friday     No current facility-administered medications for this visit.      Allergies:   Patient has no known allergies.   Social History   Social History  . Marital status: Single    Spouse name: N/A  . Number of children: N/A  . Years of education: N/A   Social History Main Topics  . Smoking status: Former Research scientist (life sciences)  . Smokeless tobacco: Never Used  . Alcohol use No  . Drug use: No  . Sexual activity: Not on file   Other Topics Concern  . Not on file   Social History Narrative  . No narrative on file     Family History:  The patient's family history includes Colon cancer in his brother, father, and sister; Hypertension in his father and mother. ***  ROS:   Please see the history of present illness. Otherwise, review of systems is positive for ***.  All other systems are reviewed and otherwise negative.    PHYSICAL EXAM:   VS:  There were no vitals taken for this visit.  BMI: There is no height or weight on file to calculate BMI. GEN: Well nourished, well developed, in no acute distress  HEENT: normocephalic, atraumatic Neck: no JVD, carotid bruits, or masses Cardiac: ***RRR; no murmurs, rubs, or gallops, no edema  Respiratory:  clear to auscultation bilaterally, normal work of breathing GI: soft, nontender, nondistended, + BS MS: no deformity or atrophy  Skin: warm and dry, no rash Neuro:  Alert and Oriented x 3, Strength and sensation are intact, follows commands Psych: euthymic mood, full affect  Wt Readings from Last 3 Encounters:  01/06/17 215 lb (97.5 kg)  12/29/16 216 lb 6.4 oz (98.2 kg)  12/11/16 215 lb 9.6 oz (97.8 kg)      Studies/Labs Reviewed:   EKG:  EKG was ordered today and personally  reviewed by me and demonstrates *** EKG was not ordered today.***  Recent Labs: 12/11/2016: BUN 11; Creatinine, Ser 0.93; Hemoglobin 13.8; Platelets 254; Potassium 4.2; Sodium 139   Lipid Panel No results found for: CHOL, TRIG, HDL, CHOLHDL, VLDL, LDLCALC, LDLDIRECT  Additional studies/ records that were reviewed today include: Summarized above.***    ASSESSMENT & PLAN:   1. ***  Disposition: F/u with ***   Medication Adjustments/Labs and Tests Ordered: Current medicines are reviewed at length with the patient today.  Concerns regarding medicines are outlined above. Medication changes, Labs and Tests ordered today are summarized above and listed in the Patient Instructions accessible in Encounters.   Signed, Melina Copa PA-C  01/13/2017 12:40 PM    [facility] [place], [place], [place]  [address] Phone: [phone]; Fax: [phone]

## 2017-01-14 ENCOUNTER — Telehealth: Payer: Self-pay | Admitting: *Deleted

## 2017-01-14 ENCOUNTER — Encounter: Payer: Self-pay | Admitting: Internal Medicine

## 2017-01-14 ENCOUNTER — Ambulatory Visit: Payer: Medicare Other | Admitting: Physician Assistant

## 2017-01-14 NOTE — Telephone Encounter (Signed)
Oncology Nurse Navigator Documentation  Oncology Nurse Navigator Flowsheets 01/08/2017  Navigator Location [facility]  Navigator Encounter Type Telephone  Telephone Outgoing Call/Paul Valdez called and he was anxious about "delay with care".  I reasured him that he needs cardiac clearance before taking to surgery.  I spoke with Dr. Julien Nordmann and he would like patient to precede with cardiology work up then surgery if appropriate.  I then contacted Dr. Vivi Martens office to update them.  Paul Valdez, at Dr. Vivi Martens office will update him and cardiologist.  I then called Paul Valdez back and updated him.  He was thankful for the call.   Treatment Phase Pre-Tx/Tx Discussion  Barriers/Navigation Needs Education  Education Other  Interventions Education  Education Method Verbal  Acuity Level 1  Acuity Level 2 Educational needs  Time Spent with Patient 30

## 2017-01-19 ENCOUNTER — Ambulatory Visit: Payer: Medicare Other | Admitting: Nurse Practitioner

## 2017-01-22 ENCOUNTER — Other Ambulatory Visit: Payer: Self-pay | Admitting: *Deleted

## 2017-01-22 ENCOUNTER — Ambulatory Visit (INDEPENDENT_AMBULATORY_CARE_PROVIDER_SITE_OTHER): Payer: Medicare Other | Admitting: Interventional Cardiology

## 2017-01-22 ENCOUNTER — Encounter: Payer: Self-pay | Admitting: Interventional Cardiology

## 2017-01-22 VITALS — BP 150/82 | HR 73 | Ht 68.0 in | Wt 212.0 lb

## 2017-01-22 DIAGNOSIS — R911 Solitary pulmonary nodule: Secondary | ICD-10-CM

## 2017-01-22 DIAGNOSIS — I2510 Atherosclerotic heart disease of native coronary artery without angina pectoris: Secondary | ICD-10-CM

## 2017-01-22 DIAGNOSIS — I779 Disorder of arteries and arterioles, unspecified: Secondary | ICD-10-CM

## 2017-01-22 DIAGNOSIS — Z01810 Encounter for preprocedural cardiovascular examination: Secondary | ICD-10-CM

## 2017-01-22 DIAGNOSIS — I10 Essential (primary) hypertension: Secondary | ICD-10-CM

## 2017-01-22 DIAGNOSIS — E785 Hyperlipidemia, unspecified: Secondary | ICD-10-CM | POA: Diagnosis not present

## 2017-01-22 DIAGNOSIS — R918 Other nonspecific abnormal finding of lung field: Secondary | ICD-10-CM | POA: Diagnosis not present

## 2017-01-22 DIAGNOSIS — I739 Peripheral vascular disease, unspecified: Secondary | ICD-10-CM

## 2017-01-22 NOTE — Progress Notes (Signed)
Cardiology Office Note   Date:  01/22/2017   ID:  Paul Valdez, DOB 1946-03-25, MRN [id number]  PCP:  Paul Kroner, MD    No chief complaint on file. preoperative eval   Wt Readings from Last 3 Encounters:  01/22/17 212 lb (96.2 kg)  01/06/17 215 lb (97.5 kg)  12/29/16 216 lb 6.4 oz (98.2 kg)       History of Present Illness: Paul Valdez is a 71 y.o. male  who I saw in 2014 due to atypical chest pain. At that time, he had a negative treadmill test.  In 2015, he had a detached retina.    He has had mild carotid disease in the past.    He neds cardiac clearance for a surgical procedure.  He was being worked up for spine surgery and a chest xray showed a 4 cm LUL lung mass. CT of the chest on 12/11/2016 showed a 4.3 x 3.4 x 3.8 cm mass suspicious for lung cancer.  Overall, he feels okay. He is anxious about his cancer surgery. He denies any chest discomfort with activity. He denies any lightheadedness or syncope. His prior echo in 2015 showed normal left ventricular function.  Normal valvular function.  He has not had any signs of fluid overload or heart failure.      Past Medical History:  Diagnosis Date  . Arthritis   . Asthma   . Atypical chest pain    a. Normal nuc 2014.  . Carotid artery disease ([place])    a. Carotid duplex 2015: 72-53% RICA, 6-64% LICA.   Marland Kitchen Cataract   . Coronary artery calcification seen on CT scan   . Depression   . Detached retina   . Former consumption of alcohol   . Former tobacco use   . GERD (gastroesophageal reflux disease)    "I take heart burn medicine"  . Glaucoma   . Hyperlipemia   . Hypertension   . Low back pain 12/29/2016  . Mass of upper lobe of left lung 12/29/2016  . PVD (peripheral vascular disease) ([place])    a. Mild plaque of iliacs in 2013 on duplex; PET 2018:  PET also corroborated coronary, aortic arch, and branch vessel atherosclerotic vascular disease as well as aortoiliac atherosclerotic vascular disease.  .  Sleep apnea    has not gotten CPAP yet    Past Surgical History:  Procedure Laterality Date  . APPENDECTOMY    . COLONOSCOPY    . EYE SURGERY    . GANGLION CYST EXCISION     left hand  . HERNIA REPAIR     double hernia repair  . TUMOR REMOVAL     non cancer tumor from neck     Current Outpatient Prescriptions  Medication Sig Dispense Refill  . atorvastatin (LIPITOR) 40 MG tablet Take 40 mg by mouth every evening.   2  . cyanocobalamin (,VITAMIN B-12,) 1000 MCG/ML injection Inject 1,000 mcg into the muscle every 30 (thirty) days.    Marland Kitchen gabapentin (NEURONTIN) 300 MG capsule Take 600 mg by mouth 3 (three) times daily.    Marland Kitchen HYDROcodone-acetaminophen (NORCO/VICODIN) 5-325 MG tablet Take 2 tablets by mouth at bedtime as needed for moderate pain or severe pain.    Marland Kitchen lisinopril (PRINIVIL,ZESTRIL) 40 MG tablet Take 40 mg by mouth daily.    Marland Kitchen omeprazole (PRILOSEC) 20 MG capsule Take 20 mg by mouth every evening.   2  . sertraline (ZOLOFT) 100 MG tablet Take 100 mg  by mouth daily.    . Vitamin D, Ergocalciferol, (DRISDOL) 50000 units CAPS capsule Take 50,000 Units by mouth every 7 (seven) days. Every Friday     No current facility-administered medications for this visit.     Allergies:   Patient has no known allergies.    Social History:  The patient  reports that he has quit smoking. He has never used smokeless tobacco. He reports that he does not drink alcohol or use drugs.   Family History:  The patient's family history includes Colon cancer in his brother, father, and sister; Hypertension in his father and mother.    ROS:  Please see the history of present illness.   Otherwise, review of systems are positive for Anxiety with his recent cancer diagnosis.   All other systems are reviewed and negative.    PHYSICAL EXAM: VS:  BP (!) 150/82 (BP Location: Right Arm)   Pulse 73   Ht '5\' 8"'$  (1.727 m)   Wt 212 lb (96.2 kg)   BMI 32.23 kg/m  , BMI Body mass index is 32.23 kg/m. GEN:  Well nourished, well developed, in no acute distress  HEENT: normal  Neck: no JVD, carotid bruits, or masses Cardiac: RRR; no murmurs, rubs, or gallops,no edema  Respiratory:  clear to auscultation bilaterally, normal work of breathing GI: soft, nontender, nondistended, + BS MS: no deformity or atrophy  Skin: warm and dry, no rash Neuro:  Strength and sensation are intact Psych: euthymic mood, full affect   EKG:   The ekg ordered today demonstrates normal sinus rhythm, no ST segment changes   Recent Labs: 12/11/2016: BUN 11; Creatinine, Ser 0.93; Hemoglobin 13.8; Platelets 254; Potassium 4.2; Sodium 139   Lipid Panel No results found for: CHOL, TRIG, HDL, CHOLHDL, VLDL, LDLCALC, LDLDIRECT   Other studies Reviewed: Additional studies/ records that were reviewed today with results demonstrating: 2015 echo report.   ASSESSMENT AND PLAN:  1. Preoperative evaluation: No further cardiac testing required prior to surgery for presumed lung cancer. He had a normal stress test in 2014 and an essentially normal echo in 2015. No recent cardiac symptoms. No problems walking stairs recently. Based on his age and prior smoking history as well as other risk factors including coronary calcium, is likely a 8-0% chance of complication. This risk cannot be modified. The patient understands and is still anxious to get his surgery over with. 2. Carotid disease: He had mild to moderate carotid disease in the past. He will need a follow-up Doppler. No bruits on exam. This is not urgent and should not delay his surgery. 3. HTN: Borderline control of blood pressure today. Continue lisinopril. Blood pressure has been lower at other doctor's visits. Continue to monitor. 4. Hyperlipidemia:  Continue atorvastatin. LDL target less than 100. This followed by Paul Valdez.   Current medicines are reviewed at length with the patient today.  The patient concerns regarding his medicines were addressed.  The following  changes have been made:  No change  Labs/ tests ordered today include:   Orders Placed This Encounter  Procedures  . EKG 12-Lead    Recommend 150 minutes/week of aerobic exercise Low fat, low carb, high fiber diet recommended  Disposition:   FU prn   Signed, Larae Grooms, MD  01/22/2017 10:16 AM    [facility] [place], [place], [place]  [address] Phone: [phone]; Fax: [phone]

## 2017-01-22 NOTE — Patient Instructions (Signed)
**Note De-identified  Obfuscation** Medication Instructions:  Same-no changes  Labwork: None  Testing/Procedures: None  Follow-Up: As needed     If you need a refill on your cardiac medications before your next appointment, please call your pharmacy.   

## 2017-01-28 ENCOUNTER — Ambulatory Visit: Payer: Medicare Other | Admitting: Physician Assistant

## 2017-01-28 NOTE — Pre-Procedure Instructions (Signed)
JIMEL MYLER  01/28/2017      [facility] [place] ([place]), [place] [address] [place] [address] Phone: [phone] Fax: [phone]  [facility] ([place] [place], [place] [address] Phone: [phone] Fax: [phone]  [place], [place] [address] Phone: [phone] Fax: [phone]    Your procedure is scheduled on February 5  Report to [facility] at 0800 A.M.  Call this number if you have problems the morning of surgery:  [phone]   Remember:  Do not eat food or drink liquids after midnight.   Take these medicines the morning of surgery with A SIP OF WATER  gabapentin (NEURONTIN), HYDROcodone-acetaminophen (NORCO/VICODIN) if needed, omeprazole (PRILOSEC, sertraline (ZOLOFT)  7 days prior to surgery STOP taking any Aspirin, Aleve, Naproxen, Ibuprofen, Motrin, Advil, Goody's, BC's, all herbal medications, fish oil, and all vitamins    Do not wear jewelry.  Do not wear lotions, powders, or cologne, or deoderant.  Men may shave face and neck.  Do not bring valuables to the hospital.  [facility] is not responsible for any belongings or valuables.  Contacts, dentures or bridgework may not be worn into surgery.  Leave your suitcase in the car.  After surgery it may be brought to your room.  For patients admitted to the hospital, discharge time will be determined by your treatment team.  Patients discharged the day of surgery will not be allowed to drive home.   Special instructions:   [facility]- Preparing For Surgery  Before surgery, you can play an important role. Because skin is not sterile, your skin needs to be as free of germs as possible. You can reduce the number of germs on your skin by washing with CHG (chlorahexidine  gluconate) Soap before surgery.  CHG is an antiseptic cleaner which kills germs and bonds with the skin to continue killing germs even after washing.  Please do not use if you have an allergy to CHG or antibacterial soaps. If your skin becomes reddened/irritated stop using the CHG.  Do not shave (including legs and underarms) for at least 48 hours prior to first CHG shower. It is OK to shave your face.  Please follow these instructions carefully.   1. Shower the NIGHT BEFORE SURGERY and the MORNING OF SURGERY with CHG.   2. If you chose to wash your hair, wash your hair first as usual with your normal shampoo.  3. After you shampoo, rinse your hair and body thoroughly to remove the shampoo.  4. Use CHG as you would any other liquid soap. You can apply CHG directly to the skin and wash gently with a scrungie or a clean washcloth.   5. Apply the CHG Soap to your body ONLY FROM THE NECK DOWN.  Do not use on open wounds or open sores. Avoid contact with your eyes, ears, mouth and genitals (private parts). Wash genitals (private parts) with your normal soap.  6. Wash thoroughly, paying special attention to the area where your surgery will be performed.  7. Thoroughly rinse your body with warm water from the neck down.  8. DO NOT shower/wash with your normal soap after using and rinsing off the CHG Soap.  9. Pat yourself dry with a CLEAN TOWEL.   10. Wear CLEAN PAJAMAS  11. Place CLEAN SHEETS on your bed the night of your first shower and DO NOT SLEEP WITH PETS.    Day of Surgery: Do not apply any deodorants/lotions. Please wear clean clothes to the hospital/surgery center.      Please read over the following fact sheets that you were given.

## 2017-01-29 ENCOUNTER — Encounter (HOSPITAL_COMMUNITY)
Admission: RE | Admit: 2017-01-29 | Discharge: 2017-01-29 | Disposition: A | Discharge status: Home / Self Care | Payer: Medicare Other | Source: Ambulatory Visit | Attending: Surgery | Admitting: Surgery

## 2017-01-29 ENCOUNTER — Telehealth: Payer: Self-pay | Admitting: *Deleted

## 2017-01-29 ENCOUNTER — Encounter (HOSPITAL_COMMUNITY): Payer: Self-pay

## 2017-01-29 DIAGNOSIS — R911 Solitary pulmonary nodule: Secondary | ICD-10-CM | POA: Insufficient documentation

## 2017-01-29 DIAGNOSIS — Z01812 Encounter for preprocedural laboratory examination: Secondary | ICD-10-CM | POA: Insufficient documentation

## 2017-01-29 DIAGNOSIS — Z01818 Encounter for other preprocedural examination: Secondary | ICD-10-CM | POA: Insufficient documentation

## 2017-01-29 LAB — COMPREHENSIVE METABOLIC PANEL
ALBUMIN: 3.8 g/dL (ref 3.5–5.0)
ALT: 16 U/L — ABNORMAL LOW (ref 17–63)
ANION GAP: 10 (ref 5–15)
AST: 16 U/L (ref 15–41)
Alkaline Phosphatase: 65 U/L (ref 38–126)
BUN: 18 mg/dL (ref 6–20)
CO2: 21 mmol/L — ABNORMAL LOW (ref 22–32)
Calcium: 9.4 mg/dL (ref 8.9–10.3)
Chloride: 103 mmol/L (ref 101–111)
Creatinine, Ser: 0.9 mg/dL (ref 0.61–1.24)
GFR calc Af Amer: >60 mL/min (ref >60)
Glucose, Bld: 114 mg/dL — ABNORMAL HIGH (ref 65–99)
POTASSIUM: 4.4 mmol/L (ref 3.5–5.1)
Sodium: 134 mmol/L — ABNORMAL LOW (ref 135–145)
Total Bilirubin: 0.6 mg/dL (ref 0.3–1.2)
Total Protein: 7.1 g/dL (ref 6.5–8.1)

## 2017-01-29 LAB — SURGICAL PCR SCREEN
MRSA, PCR: NEGATIVE
STAPHYLOCOCCUS AUREUS: NEGATIVE

## 2017-01-29 LAB — URINALYSIS, ROUTINE W REFLEX MICROSCOPIC
BACTERIA UA: NONE SEEN
Bilirubin Urine: NEGATIVE
Glucose, UA: NEGATIVE mg/dL
KETONES UR: NEGATIVE mg/dL
LEUKOCYTES UA: NEGATIVE
Nitrite: NEGATIVE
PH: 6 (ref 5.0–8.0)
Protein, ur: NEGATIVE mg/dL
SQUAMOUS EPITHELIAL / LPF: NONE SEEN
Specific Gravity, Urine: 1.014 (ref 1.005–1.030)

## 2017-01-29 LAB — PROTIME-INR
INR: 0.97
PROTHROMBIN TIME: 12.8 s (ref 11.4–15.2)

## 2017-01-29 LAB — CBC
HEMATOCRIT: 42.2 % (ref 39.0–52.0)
Hemoglobin: 13.9 g/dL (ref 13.0–17.0)
MCH: 29.2 pg (ref 26.0–34.0)
MCHC: 32.9 g/dL (ref 30.0–36.0)
MCV: 88.7 fL (ref 78.0–100.0)
Platelets: 249 10*3/uL (ref 150–400)
RBC: 4.76 MIL/uL (ref 4.22–5.81)
RDW: 14 % (ref 11.5–15.5)
WBC: 9.4 10*3/uL (ref 4.0–10.5)

## 2017-01-29 LAB — BLOOD GAS, ARTERIAL
ACID-BASE EXCESS: 1.8 mmol/L (ref 0.0–2.0)
BICARBONATE: 25.9 mmol/L (ref 20.0–28.0)
Drawn by: 449841
FIO2: 21
O2 Saturation: 91.2 %
Patient temperature: 98.6
pCO2 arterial: 41.1 mmHg (ref 32.0–48.0)
pH, Arterial: 7.416 (ref 7.350–7.450)
pO2, Arterial: 62.6 mmHg — ABNORMAL LOW (ref 83.0–108.0)

## 2017-01-29 LAB — APTT: APTT: 33 s (ref 24–36)

## 2017-01-29 NOTE — Telephone Encounter (Signed)
Oncology Nurse Navigator Documentation  Oncology Nurse Navigator Flowsheets 01/29/2017  Navigator Location [facility]  Navigator Encounter Type Telephone/I called Paul Valdez today to check on him.  He is having surgery on 02/01/17.  I asked how he was feeling.  I listened as he explained.  I gave post surgery instructions verbally.  He was thankful for the call.   Telephone Outgoing Call  Surgery Date 02/01/2017  Treatment Phase Pre-Tx/Tx Discussion  Barriers/Navigation Needs Education  Education Other  Interventions Education  Education Method Verbal  Acuity Level 2  Acuity Level 2 Educational needs  Time Spent with Patient 30

## 2017-01-29 NOTE — Progress Notes (Signed)
PCP - Antony Contras Cardiologist - [place]  Chest x-ray - 01/29/17 EKG - 01/22/17 Stress Test - 04/20/13 ECHO - 11/12/14 Cardiac Cath - denies Sleep Study - Requesting from [facility] Patient needs CPAP but does not have one he felt like he didn't need one at this time because his study was borderline according to him  Sending to anesthesia for requested records  Patient denies shortness of breath, fever, cough and chest pain at PAT appointment   Patient verbalized understanding of instructions that was given to them at the PAT appointment. Patient expressed that there were no further questions.  Patient was also instructed that they will need to review over the PAT instructions again at home before the surgery.

## 2017-02-01 ENCOUNTER — Encounter (HOSPITAL_COMMUNITY): Payer: Self-pay | Admitting: Certified Registered Nurse Anesthetist

## 2017-02-01 ENCOUNTER — Inpatient Hospital Stay (HOSPITAL_COMMUNITY): Payer: Medicare Other

## 2017-02-01 ENCOUNTER — Inpatient Hospital Stay (HOSPITAL_COMMUNITY): Payer: Medicare Other | Admitting: Anesthesiology

## 2017-02-01 ENCOUNTER — Encounter (HOSPITAL_COMMUNITY)
Admission: RE | Disposition: A | Discharge status: Skilled Nursing Facility | Payer: Self-pay | Source: Ambulatory Visit | Attending: Surgery

## 2017-02-01 ENCOUNTER — Inpatient Hospital Stay (HOSPITAL_COMMUNITY)
Admission: RE | Admit: 2017-02-01 | Discharge: 2017-02-12 | DRG: 163 | Disposition: A | Discharge status: Skilled Nursing Facility | Payer: Medicare Other | Source: Ambulatory Visit | Attending: Surgery | Admitting: Surgery

## 2017-02-01 DIAGNOSIS — N179 Acute kidney failure, unspecified: Secondary | ICD-10-CM | POA: Diagnosis not present

## 2017-02-01 DIAGNOSIS — I700 Atherosclerosis of aorta: Secondary | ICD-10-CM | POA: Diagnosis present

## 2017-02-01 DIAGNOSIS — R262 Difficulty in walking, not elsewhere classified: Secondary | ICD-10-CM

## 2017-02-01 DIAGNOSIS — I483 Typical atrial flutter: Secondary | ICD-10-CM | POA: Diagnosis not present

## 2017-02-01 DIAGNOSIS — I4819 Other persistent atrial fibrillation: Secondary | ICD-10-CM

## 2017-02-01 DIAGNOSIS — E872 Acidosis: Secondary | ICD-10-CM | POA: Diagnosis not present

## 2017-02-01 DIAGNOSIS — K219 Gastro-esophageal reflux disease without esophagitis: Secondary | ICD-10-CM | POA: Diagnosis present

## 2017-02-01 DIAGNOSIS — R911 Solitary pulmonary nodule: Secondary | ICD-10-CM

## 2017-02-01 DIAGNOSIS — E785 Hyperlipidemia, unspecified: Secondary | ICD-10-CM | POA: Diagnosis present

## 2017-02-01 DIAGNOSIS — I4892 Unspecified atrial flutter: Secondary | ICD-10-CM | POA: Diagnosis present

## 2017-02-01 DIAGNOSIS — J9600 Acute respiratory failure, unspecified whether with hypoxia or hypercapnia: Secondary | ICD-10-CM | POA: Diagnosis not present

## 2017-02-01 DIAGNOSIS — M954 Acquired deformity of chest and rib: Secondary | ICD-10-CM | POA: Diagnosis present

## 2017-02-01 DIAGNOSIS — F1021 Alcohol dependence, in remission: Secondary | ICD-10-CM | POA: Diagnosis present

## 2017-02-01 DIAGNOSIS — I10 Essential (primary) hypertension: Secondary | ICD-10-CM | POA: Diagnosis present

## 2017-02-01 DIAGNOSIS — M1990 Unspecified osteoarthritis, unspecified site: Secondary | ICD-10-CM | POA: Diagnosis present

## 2017-02-01 DIAGNOSIS — Z902 Acquired absence of lung [part of]: Secondary | ICD-10-CM | POA: Diagnosis not present

## 2017-02-01 DIAGNOSIS — G4710 Hypersomnia, unspecified: Secondary | ICD-10-CM | POA: Diagnosis not present

## 2017-02-01 DIAGNOSIS — E669 Obesity, unspecified: Secondary | ICD-10-CM | POA: Diagnosis present

## 2017-02-01 DIAGNOSIS — R918 Other nonspecific abnormal finding of lung field: Secondary | ICD-10-CM | POA: Diagnosis not present

## 2017-02-01 DIAGNOSIS — J939 Pneumothorax, unspecified: Secondary | ICD-10-CM | POA: Diagnosis not present

## 2017-02-01 DIAGNOSIS — R34 Anuria and oliguria: Secondary | ICD-10-CM | POA: Diagnosis not present

## 2017-02-01 DIAGNOSIS — I2510 Atherosclerotic heart disease of native coronary artery without angina pectoris: Secondary | ICD-10-CM | POA: Diagnosis present

## 2017-02-01 DIAGNOSIS — D62 Acute posthemorrhagic anemia: Secondary | ICD-10-CM | POA: Diagnosis not present

## 2017-02-01 DIAGNOSIS — D72829 Elevated white blood cell count, unspecified: Secondary | ICD-10-CM | POA: Diagnosis not present

## 2017-02-01 DIAGNOSIS — I509 Heart failure, unspecified: Secondary | ICD-10-CM | POA: Diagnosis not present

## 2017-02-01 DIAGNOSIS — I959 Hypotension, unspecified: Secondary | ICD-10-CM | POA: Diagnosis not present

## 2017-02-01 DIAGNOSIS — J80 Acute respiratory distress syndrome: Secondary | ICD-10-CM | POA: Diagnosis not present

## 2017-02-01 DIAGNOSIS — C3412 Malignant neoplasm of upper lobe, left bronchus or lung: Principal | ICD-10-CM | POA: Diagnosis present

## 2017-02-01 DIAGNOSIS — Z79899 Other long term (current) drug therapy: Secondary | ICD-10-CM

## 2017-02-01 DIAGNOSIS — I4891 Unspecified atrial fibrillation: Secondary | ICD-10-CM | POA: Diagnosis not present

## 2017-02-01 DIAGNOSIS — K449 Diaphragmatic hernia without obstruction or gangrene: Secondary | ICD-10-CM | POA: Diagnosis present

## 2017-02-01 DIAGNOSIS — J9811 Atelectasis: Secondary | ICD-10-CM | POA: Diagnosis not present

## 2017-02-01 DIAGNOSIS — J9601 Acute respiratory failure with hypoxia: Secondary | ICD-10-CM | POA: Diagnosis not present

## 2017-02-01 DIAGNOSIS — G4733 Obstructive sleep apnea (adult) (pediatric): Secondary | ICD-10-CM | POA: Diagnosis present

## 2017-02-01 DIAGNOSIS — I739 Peripheral vascular disease, unspecified: Secondary | ICD-10-CM | POA: Diagnosis present

## 2017-02-01 DIAGNOSIS — H409 Unspecified glaucoma: Secondary | ICD-10-CM | POA: Diagnosis present

## 2017-02-01 DIAGNOSIS — J9602 Acute respiratory failure with hypercapnia: Secondary | ICD-10-CM | POA: Diagnosis not present

## 2017-02-01 DIAGNOSIS — Z6832 Body mass index (BMI) 32.0-32.9, adult: Secondary | ICD-10-CM

## 2017-02-01 DIAGNOSIS — I481 Persistent atrial fibrillation: Secondary | ICD-10-CM | POA: Diagnosis not present

## 2017-02-01 DIAGNOSIS — Z87891 Personal history of nicotine dependence: Secondary | ICD-10-CM

## 2017-02-01 DIAGNOSIS — M5126 Other intervertebral disc displacement, lumbar region: Secondary | ICD-10-CM | POA: Diagnosis present

## 2017-02-01 DIAGNOSIS — F329 Major depressive disorder, single episode, unspecified: Secondary | ICD-10-CM | POA: Diagnosis present

## 2017-02-01 DIAGNOSIS — I480 Paroxysmal atrial fibrillation: Secondary | ICD-10-CM | POA: Diagnosis not present

## 2017-02-01 HISTORY — PX: THORACOTOMY/LOBECTOMY: SHX6116

## 2017-02-01 HISTORY — PX: FLEXIBLE BRONCHOSCOPY: SHX5094

## 2017-02-01 LAB — BASIC METABOLIC PANEL
ANION GAP: 11 (ref 5–15)
BUN: 21 mg/dL — ABNORMAL HIGH (ref 6–20)
CHLORIDE: 101 mmol/L (ref 101–111)
CO2: 24 mmol/L (ref 22–32)
Calcium: 8.3 mg/dL — ABNORMAL LOW (ref 8.9–10.3)
Creatinine, Ser: 1.96 mg/dL — ABNORMAL HIGH (ref 0.61–1.24)
GFR calc Af Amer: 38 mL/min — ABNORMAL LOW (ref >60)
GFR calc non Af Amer: 33 mL/min — ABNORMAL LOW (ref >60)
GLUCOSE: 133 mg/dL — AB (ref 65–99)
POTASSIUM: 5 mmol/L (ref 3.5–5.1)
Sodium: 136 mmol/L (ref 135–145)

## 2017-02-01 LAB — POCT I-STAT 3, ART BLOOD GAS (G3+)
ACID-BASE DEFICIT: 1 mmol/L (ref 0.0–2.0)
Acid-base deficit: 2 mmol/L (ref 0.0–2.0)
Acid-base deficit: 2 mmol/L (ref 0.0–2.0)
Acid-base deficit: 2 mmol/L (ref 0.0–2.0)
Bicarbonate: 29.5 mmol/L — ABNORMAL HIGH (ref 20.0–28.0)
Bicarbonate: 28.2 mmol/L — ABNORMAL HIGH (ref 20.0–28.0)
Bicarbonate: 28.8 mmol/L — ABNORMAL HIGH (ref 20.0–28.0)
Bicarbonate: 26.8 mmol/L (ref 20.0–28.0)
O2 SAT: 90 %
O2 Saturation: 96 %
O2 Saturation: 95 %
O2 Saturation: 93 %
PCO2 ART: 82.3 mmHg — AB (ref 32.0–48.0)
PCO2 ART: 62.8 mmHg — AB (ref 32.0–48.0)
PH ART: 7.16 — AB (ref 7.350–7.450)
PH ART: 7.179 — AB (ref 7.350–7.450)
PH ART: 7.188 — AB (ref 7.350–7.450)
PH ART: 7.238 — AB (ref 7.350–7.450)
PO2 ART: 110 mmHg — AB (ref 83.0–108.0)
PO2 ART: 70 mmHg — AB (ref 83.0–108.0)
Patient temperature: 97.8
Patient temperature: 97
Patient temperature: 98.6
TCO2: 32 mmol/L (ref 0–100)
TCO2: 30 mmol/L (ref 0–100)
TCO2: 31 mmol/L (ref 0–100)
TCO2: 29 mmol/L (ref 0–100)
pCO2 arterial: 74.7 mmHg (ref 32.0–48.0)
pCO2 arterial: 75.7 mmHg (ref 32.0–48.0)
pO2, Arterial: 92 mmHg (ref 83.0–108.0)
pO2, Arterial: 86 mmHg (ref 83.0–108.0)

## 2017-02-01 LAB — POCT I-STAT 7, (LYTES, BLD GAS, ICA,H+H)
Acid-Base Excess: 2 mmol/L (ref 0.0–2.0)
Bicarbonate: 28.1 mmol/L — ABNORMAL HIGH (ref 20.0–28.0)
Calcium, Ion: 1.18 mmol/L (ref 1.15–1.40)
HCT: 35 % — ABNORMAL LOW (ref 39.0–52.0)
HEMOGLOBIN: 11.9 g/dL — AB (ref 13.0–17.0)
O2 SAT: 89 %
PCO2 ART: 47.1 mmHg (ref 32.0–48.0)
PO2 ART: 54 mmHg — AB (ref 83.0–108.0)
Patient temperature: 35.5
Potassium: 4.2 mmol/L (ref 3.5–5.1)
Sodium: 137 mmol/L (ref 135–145)
TCO2: 30 mmol/L (ref 0–100)
pH, Arterial: 7.376 (ref 7.350–7.450)

## 2017-02-01 LAB — CBC
HEMATOCRIT: 38.9 % — AB (ref 39.0–52.0)
Hemoglobin: 12.3 g/dL — ABNORMAL LOW (ref 13.0–17.0)
MCH: 29.4 pg (ref 26.0–34.0)
MCHC: 31.6 g/dL (ref 30.0–36.0)
MCV: 92.8 fL (ref 78.0–100.0)
Platelets: 264 10*3/uL (ref 150–400)
RBC: 4.19 MIL/uL — ABNORMAL LOW (ref 4.22–5.81)
RDW: 14.6 % (ref 11.5–15.5)
WBC: 18.6 10*3/uL — AB (ref 4.0–10.5)

## 2017-02-01 LAB — PREPARE RBC (CROSSMATCH)

## 2017-02-01 LAB — POCT I-STAT 4, (NA,K, GLUC, HGB,HCT)
GLUCOSE: 166 mg/dL — AB (ref 65–99)
HCT: 41 % (ref 39.0–52.0)
Hemoglobin: 13.9 g/dL (ref 13.0–17.0)
POTASSIUM: 5.4 mmol/L — AB (ref 3.5–5.1)
Sodium: 138 mmol/L (ref 135–145)

## 2017-02-01 LAB — GLUCOSE, CAPILLARY: GLUCOSE-CAPILLARY: 145 mg/dL — AB (ref 65–99)

## 2017-02-01 SURGERY — BRONCHOSCOPY, FLEXIBLE
Anesthesia: General

## 2017-02-01 MED ORDER — NALOXONE HCL 0.4 MG/ML IJ SOLN
0.4000 mg | INTRAMUSCULAR | Status: DC | PRN
Start: 2017-02-01 — End: 2017-02-08
  Administered 2017-02-01: 0.4 mg via INTRAVENOUS

## 2017-02-01 MED ORDER — FENTANYL CITRATE (PF) 100 MCG/2ML IJ SOLN
INTRAMUSCULAR | Status: DC
Start: 2017-02-01 — End: 2017-02-01
  Filled 2017-02-01: qty 2

## 2017-02-01 MED ORDER — BISACODYL 5 MG PO TBEC
10.0000 mg | DELAYED_RELEASE_TABLET | Freq: Every day | ORAL | Status: DC
  Administered 2017-02-02: 10 mg via ORAL
  Filled 2017-02-01 (×2): qty 2

## 2017-02-01 MED ORDER — PHENYLEPHRINE HCL 10 MG/ML IJ SOLN
INTRAMUSCULAR | Status: DC | PRN
  Administered 2017-02-01: 120 ug via INTRAVENOUS

## 2017-02-01 MED ORDER — SODIUM CHLORIDE 0.9 % IV SOLN: Freq: Once | INTRAVENOUS | Status: DC

## 2017-02-01 MED ORDER — SODIUM CHLORIDE 0.9 % IV SOLN
INTRAVENOUS | Status: DC
  Administered 2017-02-01 – 2017-02-03 (×4): via INTRAVENOUS

## 2017-02-01 MED ORDER — HEMOSTATIC AGENTS (NO CHARGE) OPTIME
TOPICAL | Status: DC | PRN
  Administered 2017-02-01: 1 via TOPICAL

## 2017-02-01 MED ORDER — SENNOSIDES-DOCUSATE SODIUM 8.6-50 MG PO TABS
1.0000 | ORAL_TABLET | Freq: Every day | ORAL | Status: DC
  Administered 2017-02-02 – 2017-02-10 (×7): 1 via ORAL
  Filled 2017-02-01 (×8): qty 1

## 2017-02-01 MED ORDER — MIDAZOLAM HCL 2 MG/2ML IJ SOLN
INTRAMUSCULAR | Status: AC
  Filled 2017-02-01: qty 2

## 2017-02-01 MED ORDER — ONDANSETRON HCL 4 MG/2ML IJ SOLN
INTRAMUSCULAR | Status: AC
  Administered 2017-02-01: 4 mg via INTRAVENOUS
  Filled 2017-02-01: qty 2

## 2017-02-01 MED ORDER — ACETAMINOPHEN 500 MG PO TABS
1000.0000 mg | ORAL_TABLET | Freq: Four times a day (QID) | ORAL | Status: DC
  Administered 2017-02-01 – 2017-02-02 (×4): 1000 mg via ORAL
  Filled 2017-02-01 (×5): qty 2

## 2017-02-01 MED ORDER — 0.9 % SODIUM CHLORIDE (POUR BTL) OPTIME
TOPICAL | Status: DC | PRN
  Administered 2017-02-01 (×3): 1000 mL

## 2017-02-01 MED ORDER — PROPOFOL 10 MG/ML IV BOLUS
INTRAVENOUS | Status: DC | PRN
  Administered 2017-02-01: 100 mg via INTRAVENOUS

## 2017-02-01 MED ORDER — DIPHENHYDRAMINE HCL 50 MG/ML IJ SOLN: 12.5000 mg | Freq: Four times a day (QID) | INTRAMUSCULAR | Status: DC | PRN

## 2017-02-01 MED ORDER — ALBUMIN HUMAN 5 % IV SOLN
12.5000 g | Freq: Once | INTRAVENOUS | Status: AC
Start: 2017-02-01 — End: 2017-02-01
  Administered 2017-02-01: 12.5 g via INTRAVENOUS
  Filled 2017-02-01: qty 250

## 2017-02-01 MED ORDER — FENTANYL CITRATE (PF) 100 MCG/2ML IJ SOLN
INTRAMUSCULAR | Status: AC
  Filled 2017-02-01: qty 2

## 2017-02-01 MED ORDER — ONDANSETRON HCL 4 MG/2ML IJ SOLN: 4.0000 mg | Freq: Four times a day (QID) | INTRAMUSCULAR | Status: DC | PRN

## 2017-02-01 MED ORDER — FENTANYL CITRATE (PF) 100 MCG/2ML IJ SOLN
INTRAMUSCULAR | Status: AC
  Administered 2017-02-01: 25 ug via INTRAVENOUS
  Filled 2017-02-01: qty 2

## 2017-02-01 MED ORDER — LIDOCAINE 2% (20 MG/ML) 5 ML SYRINGE
INTRAMUSCULAR | Status: AC
  Filled 2017-02-01: qty 5

## 2017-02-01 MED ORDER — VANCOMYCIN HCL IN DEXTROSE 1-5 GM/200ML-% IV SOLN
1000.0000 mg | Freq: Two times a day (BID) | INTRAVENOUS | Status: AC
  Administered 2017-02-01: 1000 mg via INTRAVENOUS
  Filled 2017-02-01: qty 200

## 2017-02-01 MED ORDER — CEFUROXIME SODIUM 750 MG IJ SOLR
INTRAMUSCULAR | Status: AC
  Filled 2017-02-01: qty 1500

## 2017-02-01 MED ORDER — ALBUMIN HUMAN 5 % IV SOLN
12.5000 g | Freq: Once | INTRAVENOUS | Status: AC
  Administered 2017-02-01: 12.5 g via INTRAVENOUS

## 2017-02-01 MED ORDER — ALBUMIN HUMAN 5 % IV SOLN
INTRAVENOUS | Status: DC | PRN
  Administered 2017-02-01: 12:00:00 via INTRAVENOUS

## 2017-02-01 MED ORDER — GABAPENTIN 300 MG PO CAPS: 600.0000 mg | ORAL_CAPSULE | Freq: Three times a day (TID) | ORAL | Status: DC

## 2017-02-01 MED ORDER — ONDANSETRON HCL 4 MG/2ML IJ SOLN
INTRAMUSCULAR | Status: AC
  Filled 2017-02-01: qty 2

## 2017-02-01 MED ORDER — ATORVASTATIN CALCIUM 40 MG PO TABS
40.0000 mg | ORAL_TABLET | Freq: Every evening | ORAL | Status: DC
  Filled 2017-02-01: qty 1

## 2017-02-01 MED ORDER — BUPIVACAINE HCL (PF) 0.5 % IJ SOLN
INTRAMUSCULAR | Status: AC
  Filled 2017-02-01: qty 30

## 2017-02-01 MED ORDER — LIDOCAINE HCL (CARDIAC) 20 MG/ML IV SOLN
INTRAVENOUS | Status: DC | PRN
  Administered 2017-02-01: 5 mL via INTRATRACHEAL

## 2017-02-01 MED ORDER — ACETAMINOPHEN 160 MG/5ML PO SOLN: 1000.0000 mg | Freq: Four times a day (QID) | ORAL | Status: DC

## 2017-02-01 MED ORDER — SUGAMMADEX SODIUM 200 MG/2ML IV SOLN
INTRAVENOUS | Status: DC | PRN
  Administered 2017-02-01: 190 mg via INTRAVENOUS

## 2017-02-01 MED ORDER — NALOXONE HCL 0.4 MG/ML IJ SOLN
0.4000 mg | INTRAMUSCULAR | Status: DC | PRN
  Filled 2017-02-01: qty 1

## 2017-02-01 MED ORDER — ROCURONIUM BROMIDE 50 MG/5ML IV SOSY
PREFILLED_SYRINGE | INTRAVENOUS | Status: AC
  Filled 2017-02-01: qty 20

## 2017-02-01 MED ORDER — ONDANSETRON HCL 4 MG/2ML IJ SOLN
INTRAMUSCULAR | Status: DC | PRN
  Administered 2017-02-01: 4 mg via INTRAVENOUS

## 2017-02-01 MED ORDER — SODIUM CHLORIDE 0.9 % IV SOLN
30.0000 meq | Freq: Every day | INTRAVENOUS | Status: DC | PRN
  Filled 2017-02-01: qty 15

## 2017-02-01 MED ORDER — PHENYLEPHRINE HCL 10 MG/ML IJ SOLN
INTRAMUSCULAR | Status: DC | PRN
  Administered 2017-02-01: 20 ug/min via INTRAVENOUS

## 2017-02-01 MED ORDER — LABETALOL HCL 5 MG/ML IV SOLN
INTRAVENOUS | Status: DC | PRN
  Administered 2017-02-01 (×4): 5 mg via INTRAVENOUS

## 2017-02-01 MED ORDER — ONDANSETRON HCL 4 MG/2ML IJ SOLN
4.0000 mg | Freq: Once | INTRAMUSCULAR | Status: AC | PRN
  Administered 2017-02-01: 4 mg via INTRAVENOUS

## 2017-02-01 MED ORDER — KETOROLAC TROMETHAMINE 30 MG/ML IJ SOLN: 30.0000 mg | Freq: Four times a day (QID) | INTRAMUSCULAR | Status: DC | PRN

## 2017-02-01 MED ORDER — FENTANYL 40 MCG/ML IV SOLN
INTRAVENOUS | Status: DC
  Administered 2017-02-01: 17:00:00 via INTRAVENOUS
  Administered 2017-02-02: 45 ug via INTRAVENOUS
  Administered 2017-02-02: 35 ug via INTRAVENOUS
  Administered 2017-02-02: 135 ug via INTRAVENOUS
  Administered 2017-02-02: 90 ug via INTRAVENOUS
  Administered 2017-02-02: 30 ug via INTRAVENOUS
  Administered 2017-02-03: 160 ug via INTRAVENOUS
  Administered 2017-02-03: 135 ug via INTRAVENOUS
  Administered 2017-02-03: 15 ug via INTRAVENOUS
  Administered 2017-02-03: 45 ug via INTRAVENOUS
  Administered 2017-02-03: 30 ug via INTRAVENOUS
  Administered 2017-02-03: 120 ug via INTRAVENOUS
  Administered 2017-02-03: 1000 ug via INTRAVENOUS
  Administered 2017-02-04: 105 ug via INTRAVENOUS
  Administered 2017-02-04: 60 ug via INTRAVENOUS
  Administered 2017-02-04 (×2): 120 ug via INTRAVENOUS
  Administered 2017-02-04: 105 ug via INTRAVENOUS
  Administered 2017-02-04: 45 ug via INTRAVENOUS
  Administered 2017-02-05: 75 ug via INTRAVENOUS
  Administered 2017-02-05: 60 ug via INTRAVENOUS
  Administered 2017-02-05: 105 ug via INTRAVENOUS
  Filled 2017-02-01 (×2): qty 25

## 2017-02-01 MED ORDER — SERTRALINE HCL 100 MG PO TABS: 100.0000 mg | ORAL_TABLET | Freq: Every day | ORAL | Status: DC

## 2017-02-01 MED ORDER — TRAMADOL HCL 50 MG PO TABS
50.0000 mg | ORAL_TABLET | Freq: Four times a day (QID) | ORAL | Status: DC | PRN
  Administered 2017-02-06 – 2017-02-11 (×10): 50 mg via ORAL
  Filled 2017-02-01 (×3): qty 1
  Filled 2017-02-01 (×2): qty 2
  Filled 2017-02-01 (×5): qty 1
  Filled 2017-02-01: qty 2
  Filled 2017-02-01 (×2): qty 1
  Filled 2017-02-01: qty 2

## 2017-02-01 MED ORDER — SUGAMMADEX SODIUM 200 MG/2ML IV SOLN
INTRAVENOUS | Status: AC
  Filled 2017-02-01: qty 2

## 2017-02-01 MED ORDER — SODIUM CHLORIDE 0.9 % IV SOLN
INTRAVENOUS | Status: DC
  Administered 2017-02-01: 19:00:00 via INTRAVENOUS

## 2017-02-01 MED ORDER — SODIUM CHLORIDE 0.9% FLUSH: 9.0000 mL | INTRAVENOUS | Status: DC | PRN

## 2017-02-01 MED ORDER — LEVALBUTEROL HCL 0.63 MG/3ML IN NEBU
0.6300 mg | INHALATION_SOLUTION | Freq: Four times a day (QID) | RESPIRATORY_TRACT | Status: DC
  Administered 2017-02-01 – 2017-02-05 (×15): 0.63 mg via RESPIRATORY_TRACT
  Filled 2017-02-01 (×15): qty 3

## 2017-02-01 MED ORDER — FENTANYL CITRATE (PF) 100 MCG/2ML IJ SOLN
50.0000 ug | Freq: Once | INTRAMUSCULAR | Status: AC
  Administered 2017-02-01: 50 ug via INTRAVENOUS
  Filled 2017-02-01: qty 1

## 2017-02-01 MED ORDER — CEFAZOLIN SODIUM 1 G IJ SOLR
INTRAMUSCULAR | Status: AC
  Filled 2017-02-01: qty 20

## 2017-02-01 MED ORDER — METOCLOPRAMIDE HCL 5 MG/ML IJ SOLN
10.0000 mg | Freq: Four times a day (QID) | INTRAMUSCULAR | Status: DC
  Administered 2017-02-02 – 2017-02-05 (×14): 10 mg via INTRAVENOUS
  Filled 2017-02-01 (×14): qty 2

## 2017-02-01 MED ORDER — NALOXONE HCL 0.4 MG/ML IJ SOLN
INTRAMUSCULAR | Status: DC | PRN
  Administered 2017-02-01 (×2): 40 ug via INTRAVENOUS

## 2017-02-01 MED ORDER — FENTANYL CITRATE (PF) 100 MCG/2ML IJ SOLN
25.0000 ug | INTRAMUSCULAR | Status: DC | PRN
  Administered 2017-02-01 (×4): 25 ug via INTRAVENOUS

## 2017-02-01 MED ORDER — OXYCODONE HCL 5 MG PO TABS
5.0000 mg | ORAL_TABLET | ORAL | Status: DC | PRN
  Administered 2017-02-03: 10 mg via ORAL
  Filled 2017-02-01: qty 2

## 2017-02-01 MED ORDER — MIDAZOLAM HCL 2 MG/2ML IJ SOLN
1.0000 mg | Freq: Once | INTRAMUSCULAR | Status: AC
  Administered 2017-02-01: 1 mg via INTRAVENOUS

## 2017-02-01 MED ORDER — DEXTROSE-NACL 5-0.9 % IV SOLN: INTRAVENOUS | Status: DC

## 2017-02-01 MED ORDER — CEFUROXIME SODIUM 1.5 G IJ SOLR
1.5000 g | Freq: Two times a day (BID) | INTRAMUSCULAR | Status: AC
  Administered 2017-02-02 (×2): 1.5 g via INTRAVENOUS
  Filled 2017-02-01 (×2): qty 1.5

## 2017-02-01 MED ORDER — ALBUMIN HUMAN 5 % IV SOLN
INTRAVENOUS | Status: AC
  Filled 2017-02-01: qty 250

## 2017-02-01 MED ORDER — DIPHENHYDRAMINE HCL 12.5 MG/5ML PO ELIX: 12.5000 mg | ORAL_SOLUTION | Freq: Four times a day (QID) | ORAL | Status: DC | PRN

## 2017-02-01 MED ORDER — PHENYLEPHRINE 40 MCG/ML (10ML) SYRINGE FOR IV PUSH (FOR BLOOD PRESSURE SUPPORT)
PREFILLED_SYRINGE | INTRAVENOUS | Status: AC
  Filled 2017-02-01: qty 10

## 2017-02-01 MED ORDER — ROCURONIUM BROMIDE 100 MG/10ML IV SOLN
INTRAVENOUS | Status: DC | PRN
  Administered 2017-02-01: 20 mg via INTRAVENOUS
  Administered 2017-02-01 (×2): 50 mg via INTRAVENOUS

## 2017-02-01 MED ORDER — DEXTROSE 5 % IV SOLN
1.5000 g | INTRAVENOUS | Status: AC
  Administered 2017-02-01 (×2): 1.5 g via INTRAVENOUS
  Filled 2017-02-01: qty 1.5

## 2017-02-01 MED ORDER — INSULIN ASPART 100 UNIT/ML ~~LOC~~ SOLN
0.0000 [IU] | SUBCUTANEOUS | Status: DC
  Administered 2017-02-01: 0.4 [IU] via SUBCUTANEOUS
  Administered 2017-02-01 (×2): 2 [IU] via SUBCUTANEOUS

## 2017-02-01 MED ORDER — LABETALOL HCL 5 MG/ML IV SOLN
INTRAVENOUS | Status: AC
  Filled 2017-02-01: qty 4

## 2017-02-01 MED ORDER — LACTATED RINGERS IV SOLN
INTRAVENOUS | Status: DC
  Administered 2017-02-01 (×2): via INTRAVENOUS

## 2017-02-01 MED ORDER — PROPOFOL 10 MG/ML IV BOLUS
INTRAVENOUS | Status: AC
  Filled 2017-02-01: qty 20

## 2017-02-01 MED ORDER — FENTANYL CITRATE (PF) 100 MCG/2ML IJ SOLN
INTRAMUSCULAR | Status: DC | PRN
  Administered 2017-02-01: 100 ug via INTRAVENOUS
  Administered 2017-02-01 (×3): 50 ug via INTRAVENOUS
  Administered 2017-02-01: 100 ug via INTRAVENOUS
  Administered 2017-02-01 (×3): 50 ug via INTRAVENOUS

## 2017-02-01 SURGICAL SUPPLY — 112 items
ADH SKN CLS APL DERMABOND .7 (GAUZE/BANDAGES/DRESSINGS) ×2
BALL CTTN LRG ABS STRL LF (GAUZE/BANDAGES/DRESSINGS)
BIT DRILL 7/64X5 DISP (BIT) ×4 IMPLANT
BLADE SURG 11 STRL SS (BLADE) IMPLANT
BRUSH CYTOL CELLEBRITY 1.5X140 (MISCELLANEOUS) IMPLANT
CANISTER SUCTION 2500CC (MISCELLANEOUS) ×10 IMPLANT
CATH KIT ON Q 5IN SLV (PAIN MANAGEMENT) IMPLANT
CATH THORACIC 28FR (CATHETERS) ×2 IMPLANT
CATH THORACIC 36FR (CATHETERS) IMPLANT
CATH THORACIC 36FR RT ANG (CATHETERS) IMPLANT
CATH TROCAR 32FR (CATHETERS) ×2 IMPLANT
CLIP TI MEDIUM 24 (CLIP) ×4 IMPLANT
CLIP TI MEDIUM 6 (CLIP) ×4 IMPLANT
CONN ST 1/4X3/8  BEN (MISCELLANEOUS) ×2
CONN ST 1/4X3/8 BEN (MISCELLANEOUS) IMPLANT
CONT SPEC 4OZ CLIKSEAL STRL BL (MISCELLANEOUS) ×14 IMPLANT
COTTONBALL LRG STERILE PKG (GAUZE/BANDAGES/DRESSINGS) IMPLANT
COVER SURGICAL LIGHT HANDLE (MISCELLANEOUS) ×14 IMPLANT
COVER TABLE BACK 60X90 (DRAPES) ×2 IMPLANT
DERMABOND ADVANCED (GAUZE/BANDAGES/DRESSINGS) ×2
DERMABOND ADVANCED .7 DNX12 (GAUZE/BANDAGES/DRESSINGS) IMPLANT
DRAIN CHANNEL 32F RND 10.7 FF (WOUND CARE) IMPLANT
DRAPE LAPAROSCOPIC ABDOMINAL (DRAPES) ×4 IMPLANT
DRAPE WARM FLUID 44X44 (DRAPE) ×4 IMPLANT
ELECT BLADE 6.5 EXT (BLADE) ×2 IMPLANT
ELECT REM PT RETURN 9FT ADLT (ELECTROSURGICAL) ×4
ELECTRODE REM PT RTRN 9FT ADLT (ELECTROSURGICAL) ×2 IMPLANT
FELT TEFLON 1X6 (MISCELLANEOUS) ×2 IMPLANT
FORCEPS BIOP RJ4 1.8 (CUTTING FORCEPS) IMPLANT
GAUZE SPONGE 4X4 12PLY STRL (GAUZE/BANDAGES/DRESSINGS) ×6 IMPLANT
GLOVE BIO SURGEON STRL SZ 6.5 (GLOVE) ×1 IMPLANT
GLOVE BIO SURGEON STRL SZ7.5 (GLOVE) ×4 IMPLANT
GLOVE BIO SURGEONS STRL SZ 6.5 (GLOVE) ×1
GLOVE BIOGEL PI IND STRL 6.5 (GLOVE) IMPLANT
GLOVE BIOGEL PI INDICATOR 6.5 (GLOVE) ×6
GLOVE EUDERMIC 7 POWDERFREE (GLOVE) ×14 IMPLANT
GLOVE SURG SS PI 6.0 STRL IVOR (GLOVE) ×2 IMPLANT
GOWN STRL REUS W/ TWL LRG LVL3 (GOWN DISPOSABLE) ×4 IMPLANT
GOWN STRL REUS W/ TWL XL LVL3 (GOWN DISPOSABLE) ×2 IMPLANT
GOWN STRL REUS W/TWL LRG LVL3 (GOWN DISPOSABLE) ×16
GOWN STRL REUS W/TWL XL LVL3 (GOWN DISPOSABLE) ×4
HANDLE STAPLE ENDO GIA SHORT (STAPLE) ×2
KIT BASIN OR (CUSTOM PROCEDURE TRAY) ×4 IMPLANT
KIT ROOM TURNOVER OR (KITS) ×8 IMPLANT
MARKER SKIN DUAL TIP RULER LAB (MISCELLANEOUS) ×2 IMPLANT
NDL BIOPSY TRANSBRONCH 21G (NEEDLE) IMPLANT
NEEDLE 22X1 1/2 (OR ONLY) (NEEDLE) IMPLANT
NEEDLE BIOPSY TRANSBRONCH 21G (NEEDLE) IMPLANT
NS IRRIG 1000ML POUR BTL (IV SOLUTION) ×16 IMPLANT
OIL SILICONE PENTAX (PARTS (SERVICE/REPAIRS)) ×2 IMPLANT
PACK CHEST (CUSTOM PROCEDURE TRAY) ×4 IMPLANT
PAD ARMBOARD 7.5X6 YLW CONV (MISCELLANEOUS) ×16 IMPLANT
PASSER SUT SWANSON 36MM LOOP (INSTRUMENTS) ×2 IMPLANT
RELOAD EGIA 45 MED/THCK PURPLE (STAPLE) ×2 IMPLANT
RELOAD EGIA 45 TAN VASC (STAPLE) ×2 IMPLANT
RELOAD EGIA 60 MED/THCK PURPLE (STAPLE) ×4 IMPLANT
RELOAD EGIA TRIS TAN 45 CVD (STAPLE) ×24 IMPLANT
RELOAD PROXIMATE 30MM GREEN (ENDOMECHANICALS) IMPLANT
RELOAD STAPLE 30 4.7 GRN THCK (ENDOMECHANICALS) IMPLANT
RELOAD STAPLE 30 GRN THCK TA (STAPLE) IMPLANT
RELOAD STAPLE 45 TAN MED CVD (STAPLE) IMPLANT
RELOAD STAPLE 60 BLK XTHK ART (STAPLE) IMPLANT
RELOAD STAPLE 60 MED/THCK ART (STAPLE) IMPLANT
RELOAD STAPLE TA30 4.8 GRN (STAPLE) ×4 IMPLANT
RELOAD TRI 2.0 60 XTHK VAS SUL (STAPLE) ×12 IMPLANT
SEALANT PATCH FIBRIN 2X4IN (MISCELLANEOUS) ×2 IMPLANT
SEALANT SURG COSEAL 4ML (VASCULAR PRODUCTS) IMPLANT
SEALANT SURG COSEAL 8ML (VASCULAR PRODUCTS) ×2 IMPLANT
SOLUTION ANTI FOG 6CC (MISCELLANEOUS) ×2 IMPLANT
SPECIMEN JAR MEDIUM (MISCELLANEOUS) ×4 IMPLANT
STAPLER ENDO GIA 12 SHRT THIN (STAPLE) IMPLANT
STAPLER ENDO GIA 12MM SHORT (STAPLE) ×2 IMPLANT
STAPLER TA30 4.8 NON-ABS (STAPLE) ×2 IMPLANT
SUT PROLENE 3 0 SH 1 (SUTURE) ×2 IMPLANT
SUT PROLENE 3 0 SH DA (SUTURE) ×2 IMPLANT
SUT PROLENE 3 0 SH1 36 (SUTURE) ×2 IMPLANT
SUT PROLENE 4 0 RB 1 (SUTURE)
SUT PROLENE 4-0 RB1 .5 CRCL 36 (SUTURE) IMPLANT
SUT PROLENE 5 0 C 1 36 (SUTURE) ×4 IMPLANT
SUT SILK  1 MH (SUTURE) ×8
SUT SILK 1 MH (SUTURE) ×4 IMPLANT
SUT SILK 1 TIES 10X30 (SUTURE) IMPLANT
SUT SILK 2 0 SH (SUTURE) ×2 IMPLANT
SUT SILK 2 0SH CR/8 30 (SUTURE) ×2 IMPLANT
SUT SILK 3 0 SH CR/8 (SUTURE) IMPLANT
SUT VIC AB 1 CTX 36 (SUTURE) ×4
SUT VIC AB 1 CTX36XBRD ANBCTR (SUTURE) ×4 IMPLANT
SUT VIC AB 2 TP1 27 (SUTURE) ×6 IMPLANT
SUT VIC AB 2-0 CT1 27 (SUTURE)
SUT VIC AB 2-0 CT1 TAPERPNT 27 (SUTURE) IMPLANT
SUT VIC AB 2-0 CTX 36 (SUTURE) ×6 IMPLANT
SUT VIC AB 2-0 UR6 27 (SUTURE) IMPLANT
SUT VIC AB 3-0 MH 27 (SUTURE) IMPLANT
SUT VIC AB 3-0 SH 27 (SUTURE)
SUT VIC AB 3-0 SH 27X BRD (SUTURE) IMPLANT
SUT VIC AB 3-0 X1 27 (SUTURE) ×6 IMPLANT
SUT VICRYL 2 TP 1 (SUTURE) ×2 IMPLANT
SYR 20ML ECCENTRIC (SYRINGE) ×2 IMPLANT
SYR 5ML LUER SLIP (SYRINGE) ×2 IMPLANT
SYR CONTROL 10ML LL (SYRINGE) IMPLANT
SYSTEM SAHARA CHEST DRAIN ATS (WOUND CARE) ×4 IMPLANT
TAPE CLOTH SURG 6X10 WHT LF (GAUZE/BANDAGES/DRESSINGS) ×2 IMPLANT
TAPE UMBILICAL 1/8 X36 TWILL (MISCELLANEOUS) IMPLANT
TIP APPLICATOR SPRAY EXTEND 16 (VASCULAR PRODUCTS) IMPLANT
TOWEL OR 17X24 6PK STRL BLUE (TOWEL DISPOSABLE) ×6 IMPLANT
TOWEL OR 17X26 10 PK STRL BLUE (TOWEL DISPOSABLE) ×4 IMPLANT
TRAP SPECIMEN MUCOUS 40CC (MISCELLANEOUS) ×2 IMPLANT
TRAY FOLEY CATH 16FRSI W/METER (SET/KITS/TRAYS/PACK) ×4 IMPLANT
TUBE CONNECTING 12'X1/4 (SUCTIONS)
TUBE CONNECTING 12X1/4 (SUCTIONS) ×2 IMPLANT
TUNNELER SHEATH ON-Q 11GX8 DSP (PAIN MANAGEMENT) IMPLANT
WATER STERILE IRR 1000ML POUR (IV SOLUTION) ×10 IMPLANT

## 2017-02-01 NOTE — Anesthesia Postprocedure Evaluation (Signed)
Anesthesia Post Note  Patient: CHANSE KAGEL  Procedure(s) Performed: Procedure(s) (LRB): FLEXIBLE BRONCHOSCOPY (N/A) THORACOTOMY/LEFT UPPER LOBECTOMY (Left)  Patient location during evaluation: PACU Anesthesia Type: General Level of consciousness: awake and alert Pain management: pain level controlled Vital Signs Assessment: post-procedure vital signs reviewed and stable Respiratory status: spontaneous breathing, nonlabored ventilation, respiratory function stable and patient connected to nasal cannula oxygen Cardiovascular status: blood pressure returned to baseline and stable Postop Assessment: no signs of nausea or vomiting Anesthetic complications: no       Last Vitals:  Vitals:   02/01/17 0751 02/01/17 1506  BP: 127/89 (!) 165/77  Pulse: 70 67  Resp: 18 (!) 25  Temp: 36.8 C     Last Pain:  Vitals:   02/01/17 0751  TempSrc: Oral                 Catalina Gravel

## 2017-02-01 NOTE — Anesthesia Procedure Notes (Signed)
Procedure Name: Intubation Date/Time: 02/01/2017 10:08 AM Performed by: Willeen Cass P Pre-anesthesia Checklist: Patient identified, Emergency Drugs available, Suction available and Patient being monitored Patient Re-evaluated:Patient Re-evaluated prior to inductionOxygen Delivery Method: Circle System Utilized Preoxygenation: Pre-oxygenation with 100% oxygen Intubation Type: IV induction Laryngoscope Size: Mac and 4 Grade View: Grade I Tube type: Oral Endobronchial tube: EBT position confirmed by auscultation, Left, Double lumen EBT and EBT position confirmed by fiberoptic bronchoscope and 39 Fr Number of attempts: 1 Airway Equipment and Method: Stylet and Oral airway Placement Confirmation: ETT inserted through vocal cords under direct vision,  positive ETCO2 and breath sounds checked- equal and bilateral Secured at: 30 cm Tube secured with: Tape Dental Injury: Teeth and Oropharynx as per pre-operative assessment  Comments: Standard ETT removed at conclusion of bronch and DLT placed for thoracotomy procedure. Dr Gifford Shave present.

## 2017-02-01 NOTE — Anesthesia Procedure Notes (Signed)
Arterial Line Insertion Start/End2/04/2017 9:18 AM, 02/01/2017 9:22 AM Performed by: Catalina Gravel, anesthesiologist  Patient location: Pre-op. Preanesthetic checklist: patient identified, IV checked, site marked, risks and benefits discussed, surgical consent, monitors and equipment checked, pre-op evaluation, timeout performed and anesthesia consent Lidocaine 1% used for infiltration radial was placed Catheter size: 20 Fr Hand hygiene performed  and maximum sterile barriers used   Attempts: 1 Procedure performed without using ultrasound guided technique. Following insertion, dressing applied and Biopatch. Post procedure assessment: normal and unchanged  Patient tolerated the procedure well with no immediate complications.

## 2017-02-01 NOTE — Progress Notes (Signed)
RT called to PACU to place patient on BIPAP post op. Pt placed on 12/6, FIO2 70% to maintain sats in low 90's. Pt seems a bit uncomfortable, but in no distress. Will cont to monitor

## 2017-02-01 NOTE — Progress Notes (Signed)
Dr. Cyndia Bent called, update given to include ABG, Bipap settings, vital signs, mental status; orders received

## 2017-02-01 NOTE — Anesthesia Procedure Notes (Addendum)
Central Venous Catheter Insertion Performed by: Catalina Gravel, anesthesiologist Start/End2/04/2017 9:10 AM, 02/01/2017 9:16 AM Patient location: Pre-op. Preanesthetic checklist: patient identified, IV checked, site marked, risks and benefits discussed, surgical consent, monitors and equipment checked, pre-op evaluation, timeout performed and anesthesia consent Position: Trendelenburg Lidocaine 1% used for infiltration and patient sedated Hand hygiene performed , maximum sterile barriers used  and Seldinger technique used Catheter size: 8 Fr Total catheter length 16. Central line was placed.Double lumen Procedure performed using ultrasound guided technique. Ultrasound Notes:anatomy identified, needle tip was noted to be adjacent to the nerve/plexus identified, no ultrasound evidence of intravascular and/or intraneural injection and image(s) printed for medical record Attempts: 1 Following insertion, dressing applied, line sutured and Biopatch. Post procedure assessment: blood return through all ports  Patient tolerated the procedure well with no immediate complications.

## 2017-02-01 NOTE — Progress Notes (Signed)
Pt. tolerating BiPAP well at this time,slowly titrating settings to v/s, RT to monitor.

## 2017-02-01 NOTE — Brief Op Note (Signed)
02/01/2017  2:05 PM  PATIENT:  Paul Valdez  71 y.o. male  PRE-OPERATIVE DIAGNOSIS:  Left upper lobe LUNG NODULE  POST-OPERATIVE DIAGNOSIS:  Left upper lobeLUNG NODULE  PROCEDURE:  Procedure(s): FLEXIBLE BRONCHOSCOPY (N/A) THORACOTOMY/LEFT UPPER LOBECTOMY (Left)  SURGEON:  Surgeon(s) and Role:    * Gaye Pollack, MD - Primary  PHYSICIAN ASSISTANT: Jadene Pierini, PA-C   ANESTHESIA:   general  EBL:  Total I/O In: 7893 [I.V.:1500; IV Piggyback:250] Out: 8101 [Urine:525; Blood:500]  BLOOD ADMINISTERED:none  DRAINS: two chest tubes left pleural space.   LOCAL MEDICATIONS USED:  NONE  SPECIMEN:  Source of Specimen:  left upper lobe lung  DISPOSITION OF SPECIMEN:  PATHOLOGY  COUNTS:  YES  TOURNIQUET:  * No tourniquets in log *  DICTATION: .Note written in EPIC  PLAN OF CARE: Admit to inpatient   PATIENT DISPOSITION:  PACU - hemodynamically stable.   Delay start of Pharmacological VTE agent (>24hrs) due to surgical blood loss or risk of bleeding: yes

## 2017-02-01 NOTE — Anesthesia Preprocedure Evaluation (Addendum)
Anesthesia Evaluation  Patient identified by MRN, date of birth, ID band Patient awake    Reviewed: Allergy & Precautions, NPO status , Patient's Chart, lab work & pertinent test results  Airway Mallampati: II  TM Distance: >3 FB Neck ROM: Full    Dental  (+) Teeth Intact, Dental Advisory Given, Partial Lower, Chipped,  Two permanent upper bridges:   Pulmonary asthma , sleep apnea , former smoker,  LUL lung nodule   Pulmonary exam normal breath sounds clear to auscultation       Cardiovascular hypertension, Pt. on medications + CAD and + Peripheral Vascular Disease  Normal cardiovascular exam Rhythm:Regular Rate:Normal     Neuro/Psych PSYCHIATRIC DISORDERS Depression negative neurological ROS     GI/Hepatic Neg liver ROS, GERD  Medicated and Controlled,  Endo/Other  Obesity   Renal/GU negative Renal ROS     Musculoskeletal  (+) Arthritis , Osteoarthritis,    Abdominal   Peds  Hematology negative hematology ROS (+)   Anesthesia Other Findings Day of surgery medications reviewed with the patient.  Reproductive/Obstetrics                            Anesthesia Physical Anesthesia Plan  ASA: III  Anesthesia Plan: General   Post-op Pain Management:    Induction: Intravenous  Airway Management Planned: Oral ETT and Double Lumen EBT  Additional Equipment: Arterial line, CVP and Ultrasound Guidance Line Placement  Intra-op Plan:   Post-operative Plan: Extubation in OR and Possible Post-op intubation/ventilation  Informed Consent: I have reviewed the patients History and Physical, chart, labs and discussed the procedure including the risks, benefits and alternatives for the proposed anesthesia with the patient or authorized representative who has indicated his/her understanding and acceptance.   Dental advisory given  Plan Discussed with: CRNA  Anesthesia Plan Comments:  (Risks/benefits of general anesthesia discussed with patient including risk of damage to teeth, lips, gum, and tongue, nausea/vomiting, allergic reactions to medications, and the possibility of heart attack, stroke and death.  All patient questions answered.  Patient wishes to proceed.)        Anesthesia Quick Evaluation

## 2017-02-01 NOTE — Progress Notes (Signed)
      [address]       [place] [address]             [phone]      PM rounds  S/p Left upper lobectomy  On BIPAP at present. 70% FiO2 sats 94%  BP 101/73   Pulse (!) 57   Temp 97.3 F (36.3 C)   Resp 17   Wt 210 lb (95.3 kg)   SpO2 95%   BMI 30.13 kg/m    Intake/Output Summary (Last 24 hours) at 02/01/17 1746 Last data filed at 02/01/17 1600  Gross per 24 hour  Intake             2050 ml  Output             1153 ml  Net              897 ml   ABG after arrival 7.16/82/113/29  Hypercarbic respiratory failure- may need to be reintubated. Adjustments have been made to BIPAP, will see how he responds  Will repeat an ABG shortly  Remo Lipps C. Roxan Hockey, MD [facility] [phone]

## 2017-02-01 NOTE — Progress Notes (Signed)
RT transported pt from PACU to 2S16 on bipap. Pt stable throughout with no complications. VS within normal limits. RT will continue to monitor.

## 2017-02-01 NOTE — Interval H&P Note (Signed)
History and Physical Interval Note:  02/01/2017 9:16 AM  Paul Valdez  has presented today for surgery, with the diagnosis of LUNG NODULE  The various methods of treatment have been discussed with the patient and family. After consideration of risks, benefits and other options for treatment, the patient has consented to  Procedure(s): FLEXIBLE BRONCHOSCOPY (N/A) THORACOTOMY/LEFT UPPER LOBECTOMY (Left) as a surgical intervention .  The patient's history has been reviewed, patient examined, no change in status, stable for surgery.  I have reviewed the patient's chart and labs.  Questions were answered to the patient's satisfaction.     Gaye Pollack

## 2017-02-01 NOTE — Plan of Care (Signed)
Problem: Physical Regulation: Goal: Postoperative complications will be avoided or minimized Outcome: Not Progressing Pt with hypercarbia, requiring Bipap postop  Problem: Respiratory: Goal: Respiratory status will improve Outcome: Not Progressing Monitoring closely, interventions per MD, RT and RN

## 2017-02-01 NOTE — Anesthesia Procedure Notes (Signed)
Procedure Name: Intubation Date/Time: 02/01/2017 9:45 AM Performed by: Willeen Cass P Pre-anesthesia Checklist: Patient identified, Emergency Drugs available, Suction available and Patient being monitored Patient Re-evaluated:Patient Re-evaluated prior to inductionOxygen Delivery Method: Circle System Utilized Preoxygenation: Pre-oxygenation with 100% oxygen Intubation Type: IV induction Ventilation: Mask ventilation without difficulty and Oral airway inserted - appropriate to patient size Laryngoscope Size: Mac and 4 Grade View: Grade I Tube type: Oral Tube size: 9.0 mm Number of attempts: 1 Airway Equipment and Method: Stylet and Oral airway Placement Confirmation: ETT inserted through vocal cords under direct vision,  positive ETCO2 and breath sounds checked- equal and bilateral Tube secured with: Tape Dental Injury: Teeth and Oropharynx as per pre-operative assessment

## 2017-02-01 NOTE — Consult Note (Signed)
PULMONARY / CRITICAL CARE MEDICINE   Name: Paul Valdez MRN: [id number] DOB: July 17, 1946    ADMISSION DATE:  02/01/2017 CONSULTATION DATE:  02/01/2017  REFERRING MD:  Dr. Roxan Hockey  CHIEF COMPLAINT:  Lethargy post op  HISTORY OF PRESENT ILLNESS:   71 year old male with PMH as below, which is significant for CAD, OSA in process of getting CPAP, and HTN. He was undergoing back pain workup for spine surgery and a 4cm LUL lung mass was discovered. CT confirmed finding and mass was suspicious for malignancy. No evidence of malignancy found on further imaging. He then presented to [facility] 02/05 for elective left upper lobectomy. The procedure was successful without complication and he was extubated post-opertively, however, several hours later he developed lethargy and was minimally responsive. ABG was done and demonstrated severe respiratory acidosis and he was started on BiPAP. ABG did not initially improve with BiPAP so PCCM was consulted.  PAST MEDICAL HISTORY :  He  has a past medical history of Arthritis; Asthma; Atypical chest pain; Carotid artery disease ([place]); Cataract; Coronary artery calcification seen on CT scan; Depression; Detached retina; Former consumption of alcohol; Former tobacco use; GERD (gastroesophageal reflux disease); Glaucoma; Hyperlipemia; Hypertension; Low back pain (12/29/2016); Mass of upper lobe of left lung (12/29/2016); PVD (peripheral vascular disease) ([place]); and Sleep apnea.  PAST SURGICAL HISTORY: He  has a past surgical history that includes Appendectomy; Eye surgery; Hernia repair; Ganglion cyst excision; Tumor removal; and Colonoscopy.  Allergies  Allergen Reactions  . Colchicine Diarrhea    No current facility-administered medications on file prior to encounter.    Current Outpatient Prescriptions on File Prior to Encounter  Medication Sig  . atorvastatin (LIPITOR) 40 MG tablet Take 40 mg by mouth every evening.   . cyanocobalamin (,VITAMIN B-12,) 1000  MCG/ML injection Inject 1,000 mcg into the muscle every 30 (thirty) days. Due 01-28-2017  . gabapentin (NEURONTIN) 300 MG capsule Take 600 mg by mouth 3 (three) times daily.  Marland Kitchen HYDROcodone-acetaminophen (NORCO/VICODIN) 5-325 MG tablet Take 2 tablets by mouth at bedtime as needed for moderate pain or severe pain.  Marland Kitchen lisinopril (PRINIVIL,ZESTRIL) 40 MG tablet Take 40 mg by mouth daily.  Marland Kitchen omeprazole (PRILOSEC) 20 MG capsule Take 20 mg by mouth every evening.   . sertraline (ZOLOFT) 100 MG tablet Take 100 mg by mouth daily.  . Vitamin D, Ergocalciferol, (DRISDOL) 50000 units CAPS capsule Take 50,000 Units by mouth every 7 (seven) days. Every Friday    FAMILY HISTORY:  His indicated that his mother is deceased. He indicated that his father is deceased. He indicated that only one of his two sisters is alive. He indicated that the status of his brother is unknown. He indicated that his maternal grandmother is deceased. He indicated that his maternal grandfather is deceased. He indicated that his paternal grandmother is deceased. He indicated that his paternal grandfather is deceased.    SOCIAL HISTORY: He  reports that he has quit smoking. He has never used smokeless tobacco. He reports that he does not drink alcohol or use drugs.  REVIEW OF SYSTEMS:   Limited due to lethargy and BiPAP Bolds are positive  Constitutional: weight loss, gain, night sweats, Fevers, chills, fatigue .  HEENT: headaches, Sore throat, sneezing, nasal congestion, post nasal drip, Difficulty swallowing, Tooth/dental problems, visual complaints visual changes, ear ache CV:  chest pain, radiates,Orthopnea, PND, swelling in lower extremities, dizziness, palpitations, syncope.  GI  heartburn, indigestion, abdominal pain, nausea, vomiting, diarrhea, change in bowel  habits, loss of appetite, bloody stools.  Resp: cough, productive: , hemoptysis, dyspnea, chest pain, pleuritic L anterior chest.  Skin: rash or itching or  icterus GU: dysuria, change in color of urine, urgency or frequency. flank pain, hematuria  MS: joint pain or swelling. decreased range of motion  Psych: change in mood or affect. depression or anxiety.  Neuro: difficulty with speech, weakness, numbness, ataxia    SUBJECTIVE: Waking up some after narcan and since BiPAP stared   VITAL SIGNS: BP 111/71   Pulse 70   Temp 97 F (36.1 C) (Oral)   Resp 15   Wt 95.3 kg (210 lb)   SpO2 94%   BMI 30.13 kg/m   HEMODYNAMICS:    VENTILATOR SETTINGS: FiO2 (%):  [70 %] 70 %  INTAKE / OUTPUT: No intake/output data recorded.  PHYSICAL EXAMINATION: General:  Obese male Neuro: Somnolent but arouses easily to verbal stimuli, no focal deficits HEENT: [place]/AT, no JVD, PERRL Cardiovascular:  RRR, no MRG.  L chest tube in place Lungs:  Clear bilateral breath sounds Abdomen:  Soft, non-tender, non-distended Musculoskeletal:  No acute deformity Skin:  Grossly intact  LABS:  BMET  Recent Labs Lab 01/29/17 0846 02/01/17 1724  NA 134* 138  K 4.4 5.4*  CL 103  --   CO2 21*  --   BUN 18  --   CREATININE 0.90  --   GLUCOSE 114* 166*    Electrolytes  Recent Labs Lab 01/29/17 0846  CALCIUM 9.4    CBC  Recent Labs Lab 01/29/17 0846 02/01/17 1724  WBC 9.4  --   HGB 13.9 13.9  HCT 42.2 41.0  PLT 249  --     Coag's  Recent Labs Lab 01/29/17 0846  APTT 33  INR 0.97    Sepsis Markers No results for input(s): LATICACIDVEN, PROCALCITON, O2SATVEN in the last 168 hours.  ABG  Recent Labs Lab 01/29/17 0903 02/01/17 1727  PHART 7.416 7.160*  PCO2ART 41.1 82.3*  PO2ART 62.6* 110.0*    Liver Enzymes  Recent Labs Lab 01/29/17 0846  AST 16  ALT 16*  ALKPHOS 65  BILITOT 0.6  ALBUMIN 3.8    Cardiac Enzymes No results for input(s): TROPONINI, PROBNP in the last 168 hours.  Glucose No results for input(s): GLUCAP in the last 168 hours.  Imaging Dg Chest Port 1 View  Result Date: 02/01/2017 CLINICAL  DATA:  Status post left upper lobectomy today EXAM: PORTABLE CHEST 1 VIEW COMPARISON:  PA and lateral chest x-ray of January 29, 2017 FINDINGS: The lung volumes are low. There is bibasilar atelectasis. There is a small left apical pneumothorax amounting to 5% or less of the lung volume. The 2 left-sided chest tubes have their tips in the left apex. There is subcutaneous emphysema in the left axillary region. The cardiac silhouette is mildly enlarged and indistinct. The pulmonary vascularity is mildly engorged. The right internal jugular venous catheter tip projects over the midportion of the SVC. There are surgical clips in the left hilar region. IMPRESSION: Mild hypoinflation greatest on the right. Bibasilar atelectasis or less likely infiltrate. Tiny left apical pneumothorax. There are 2 chest tubes with their tips in the apex. Electronically Signed   By: David  Martinique M.D.   On: 02/01/2017 15:15     STUDIES:  CXR 02/05 > small apical left PTX. Bibasilar atx.  CULTURES: None.  ANTIBIOTICS: None.  SIGNIFICANT EVENTS: 02/05 > elective LUL lobectomy.  LINES/TUBES: None.  DISCUSSION: 71 y.o. M who underwent  elective LUL lobectomy 02/05 (Dr. Cyndia Bent).  Later that evening had hypersomnolence > found to have acute hypercarbic respiratory failure therefore started on BiPAP.  ASSESSMENT / PLAN:  PULMONARY A: Acute hypercarbic respiratory failure secondary to OSA decompensation in setting of post-operative narcotics LUL mass - s/pelective lobectomy 02/05. Small left apical PTX following surgery - chest tubes already in place. Bibasilar atelectasis. Hx OSA (not on CPAP), former tobacco use. P:   Continue BiPAP. Caution with narcotics. Post op care per CVTS. Incentive spirometry. CXR in AM. Continue tobacco cessation.  CARDIOVASCULAR A:  Hx PVD, HTN, HLD, CAD, atypical chest pain. P:  Monitor hemodynamics. Continue preadmission atorvastatin. Hold preadmission  lisinopril.  RENAL A:   No acute issues. P:   Correct electrolytes as indicated. BMP in AM.  GASTROINTESTINAL A:   GERD. Nutrition. P:   NPO while using BiPAP.  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 No acute issues P:  Follow surgical pathology. Further recs per oncology (Is followed by Dr. Earlie Server). CBC in AM.  INFECTIOUS A:   No acute issues. P:   No interventions required.  ENDOCRINE A:   No acute issues. P:   No interventions required.  NEUROLOGIC A:   Low back pain - was previously being worked up for surgery (Dr. Ronnald Ramp).  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 Hx depression. P:   Pain control, caution with narcotics. Hold preadmission gabapentin, norco, sertraline.  FAMILY  - Updates: None available.  - Inter-disciplinary family meet or Palliative Care meeting due by:  02/11.  CC time: 30 minutes.   Montey Hora, [facility] Pager: [phone]  or [phone] 02/01/2017, 7:47 PM

## 2017-02-01 NOTE — H&P (Signed)
[address]       [place] [address]             [phone]      Cardiothoracic Surgery Admission History and Physical   PCP is Gara Kroner, MD Referring Provider is Curt Bears, MD      Chief Complaint  Patient presents with  . Lung Lesion    Surgical eval, PET Scan 01/04/17, Brain MRA 01/04/17, PFT'S 01/01/17, Chest CT 12/11/16    HPI:  The patient is a 71 year old previous heavy smoker with hypertension, hyperlipidemia, osteoarthritis, OSA and depression who has been having low back pain for two years and had an MRI in October 2017 showing lumbar spine disease. He was being worked up for spine surgery and a CXR showed a 4 cm LUL lung mass. CT of the chest on 12/11/2016 showed a 4.3 x 3.4 x 3.8 cm mass suspicious for lung cancer. There was a large right diaphragmatic eventration and right posterior diaphragmatic hernia. There was a large hiatal hernia with sliding and paraesophageal components as well as coronary and aortic atherosclerosis. He reports some shortness of breath at rest that is increased with exertion. He feels like this is because he has not been very active due to his back pain. He denies any cough or sputum production. He has been eating well and has had no weight loss.  He is single and has no children. His friend is with him today. He is retired. He reports a 30 pk-year smoking history but quit in 2000. 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      Past Medical History:  Diagnosis Date  . Arthritis   . Asthma   . Cataract   . Depression   . GERD (gastroesophageal reflux disease)    "I take heart burn medicine"  . Glaucoma   . Hyperlipemia   . Hypertension   . Low back pain 12/29/2016  . Mass of upper lobe of left lung 12/29/2016  . Sleep apnea    has not gotten CPAP yet         Past Surgical History:  Procedure Laterality Date  . APPENDECTOMY    . COLONOSCOPY    . EYE SURGERY      . GANGLION CYST EXCISION     left hand  . HERNIA REPAIR     double hernia repair  . TUMOR REMOVAL     non cancer tumor from neck         Family History  Problem Relation Age of Onset  . Colon cancer Father   . Hypertension Father   . Hypertension Mother   . Colon cancer Sister   . Colon cancer Brother     Social History     Social History  Substance Use Topics  . Smoking status: Former Research scientist (life sciences)  . Smokeless tobacco: Never Used  . Alcohol use No          Current Outpatient Prescriptions  Medication Sig Dispense Refill  . atorvastatin (LIPITOR) 40 MG tablet Take 40 mg by mouth every evening.   2  . cyanocobalamin (,VITAMIN B-12,) 1000 MCG/ML injection Inject 1,000 mcg into the muscle every 30 (thirty) days.    Marland Kitchen gabapentin (NEURONTIN) 300 MG capsule Take 600 mg by mouth 3 (three) times daily.    Marland Kitchen HYDROcodone-acetaminophen (NORCO/VICODIN) 5-325 MG tablet Take 2 tablets by mouth at bedtime as needed for moderate pain or severe  pain.    . lisinopril (PRINIVIL,ZESTRIL) 40 MG tablet Take 40 mg by mouth daily.    . nitroGLYCERIN (NITROSTAT) 0.4 MG SL tablet Place 1 tablet (0.4 mg total) under the tongue every 5 (five) minutes as needed for chest pain. 30 tablet 0  . omeprazole (PRILOSEC) 20 MG capsule Take 20 mg by mouth every evening.   2  . sertraline (ZOLOFT) 100 MG tablet Take 100 mg by mouth daily.    . Vitamin D, Ergocalciferol, (DRISDOL) 50000 units CAPS capsule Take 50,000 Units by mouth every 7 (seven) days. Every Friday     No current facility-administered medications for this visit.     No Known Allergies  Review of Systems  Constitutional: Positive for fatigue. Negative for activity change, appetite change, fever and unexpected weight change.  HENT: Negative.   Eyes: Negative.   Respiratory: Positive for chest tightness and shortness of breath. Negative for cough.   Cardiovascular: Positive for chest pain. Negative for  palpitations and leg swelling.  Gastrointestinal: Negative.   Endocrine: Negative.   Genitourinary: Negative.   Musculoskeletal: Positive for back pain.  Allergic/Immunologic: Negative.   Neurological: Negative.   Hematological: Negative.   Psychiatric/Behavioral: Negative.     BP 130/87   Pulse 73   Resp 20   Ht _0  (1.727 m)   Wt 215 lb (97.5 kg)   SpO2 92% Comment: RA  BMI 32.69 kg/m  Physical Exam  Constitutional: He is oriented to person, place, and time.  Obese gentleman in no distress  HENT:  Head: Normocephalic and atraumatic.  Mouth/Throat: Oropharynx is clear and moist.  Eyes: EOM are normal. Pupils are equal, round, and reactive to light.  Neck: Normal range of motion. Neck supple. No JVD present. No thyromegaly present.  Cardiovascular: Normal rate, regular rhythm, normal heart sounds and intact distal pulses.   No murmur heard. Pulmonary/Chest: Effort normal and breath sounds normal. No respiratory distress.  Abdominal: Soft. Bowel sounds are normal. He exhibits no distension and no mass. There is no tenderness.  Musculoskeletal: Normal range of motion. He exhibits no edema.  Lymphadenopathy:    He has no cervical adenopathy.  Neurological: He is alert and oriented to person, place, and time. He has normal strength. No cranial nerve deficit or sensory deficit.  Skin: Skin is warm and dry.  Psychiatric: He has a normal mood and affect.     Diagnostic Tests:  CT CHEST W CONTRAST (Accession [id number]) (Order [id number])  Imaging  Date: 12/11/2016 Department: [facility] Released By: Adelene Amas Authorizing: Eustace Moore, MD  Exam Information   Status Exam Begun  Exam Ended   Final [99] 12/11/2016 4:54 PM 12/11/2016 4:55 PM  PACS Images   Show images for CT CHEST W CONTRAST  Study Result   CLINICAL DATA: Left upper lobe lung mass on chest radiographs earlier today.  EXAM: CT CHEST WITH  CONTRAST  TECHNIQUE: Multidetector CT imaging of the chest was performed during intravenous contrast administration.  CONTRAST: 55m ISOVUE-300 IOPAMIDOL (ISOVUE-300) INJECTION 61%  COMPARISON: Previous chest radiographs, including earlier today.  FINDINGS: Cardiovascular: Aortic and coronary artery calcifications. Left atrial enlargement.  Mediastinum/Nodes: Large hiatal hernia with sliding and paraesophageal components. Mildly enlarged right hilar lymph node with a short axis diameter of 9 mm on image number 66 of series 2. No enlarged mediastinal or left hilar nodes. No axillary adenopathy. Unremarkable thyroid gland.  Lungs/Pleura: Left upper lobe mass extending to the lower of.  The mass is oval with mild irregularity. This measures 4.3 x 3.4 cm on image number 38 of series 5. This measures 3.8 cm in length on coronal image number 62.  No other lung masses or nodules. Diffuse peribronchial thickening. No pleural fluid. Bilateral bullous changes.  Upper Abdomen: Large right diaphragmatic eventration. There is also a right posterior diaphragmatic hernia containing herniated fat in the posterior right lower thorax. Normal appearing adrenal glands. Small liver cysts. The largest is in the lateral segment of the left lobe, measuring 9 mm in maximum diameter.  Musculoskeletal: Mild thoracic spine degenerative changes. No evidence of bony metastatic disease.  IMPRESSION: 1. 4.3 x 3.8 x 3.4 cm left upper lobe mass with imaging features compatible with a primary lung carcinoma. 2. Changes of COPD and chronic bronchitis with paraseptal emphysema. 3. Large right diaphragmatic eventration and right posterior diaphragmatic hernia with a moderate amount of herniated fat. 4. Large hiatal hernia with sliding and paraesophageal components. 5. Coronary artery atherosclerosis and aortic atherosclerosis.   Electronically Signed By: Claudie Revering M.D. On: 12/11/2016  17:24    MR Brain W Wo Contrast (Accession [id number]) (Order [id number])  Imaging  Date: 01/04/2017 Department: [facility] Released By: Hardie Pulley Authorizing: Curt Bears, MD  Exam Information   Status Exam Begun  Exam Ended   Final [99] 01/04/2017 7:53 AM 01/04/2017 8:47 AM  PACS Images   Show images for MR Brain W Wo Contrast  Study Result   CLINICAL DATA: Staging primary lung cancer. Evaluate for metastatic disease.  EXAM: MRI HEAD WITHOUT AND WITH CONTRAST  TECHNIQUE: Multiplanar, multiecho pulse sequences of the brain and surrounding structures were obtained without and with intravenous contrast.  CONTRAST: 44m MULTIHANCE GADOBENATE DIMEGLUMINE 529 MG/ML IV SOLN  COMPARISON: PET scan reported separately. CT chest 12/11/2016.  FINDINGS: Brain: No evidence for acute infarction, hemorrhage, mass lesion, hydrocephalus, or extra-axial fluid. Mild cerebral atrophy. Mild subcortical and periventricular T2 and FLAIR hyperintensities, likely chronic microvascular ischemic change.  Post infusion, no abnormal enhancement of brain or meninges.  Vascular: Flow voids are maintained throughout the carotid, basilar, and vertebral arteries. There are no areas of chronic hemorrhage. Major dural venous sinuses are patent.  Skull and upper cervical spine: Unremarkable visualized calvarium, skullbase, and cervical vertebrae. Pituitary, pineal, cerebellar tonsils unremarkable. No upper cervical cord lesions.  Sinuses/Orbits: No orbital masses or proptosis. Globes appear symmetric. Sinuses appear well aerated, without evidence for air-fluid level.  Other: Large sebaceous cyst, RIGHT posterior frontal scalp.  IMPRESSION: No intracranial metastatic disease is evident.  Mild atrophy and small vessel disease.   Electronically Signed By: JStaci RighterM.D. On: 01/04/2017 09:11    NM PET Image Initial (PI) Skull  Base To Thigh (Accession [id number] (Order [id number]  Imaging  Date: 01/04/2017 [facility] Released By: KMarquis LunchAuthorizing: MCurt Bears MD  Exam Information   Status Exam Begun  Exam Ended   Final [99] 01/04/2017 10:03 AM 01/04/2017 11:19 AM  PACS Images   Show images for NM PET Image Initial (PI) Skull Base To Thigh  Study Result   CLINICAL DATA: Initial treatment strategy for left upper lobe mass.  EXAM: NUCLEAR MEDICINE PET SKULL BASE TO THIGH  TECHNIQUE: 10.5 mCi F-18 FDG was injected intravenously. Full-ring PET imaging was performed from the skull base to thigh after the radiotracer. CT data was obtained and used for attenuation correction and anatomic localization.  FASTING BLOOD GLUCOSE: Value: 103 mg/dl  COMPARISON: Chest  CT dated 12/11/2016  FINDINGS: NECK  No hypermetabolic lymph nodes in the neck.  CHEST  The left upper lobe mass measures 4.3 by 3.3 cm on image 23/8, stable, and extends to the visceral pleural surface, parietal pleural involvement uncertain but no pleural effusion seen. The masses centrally photopenic but has high signal along its edges, maximum SUV 7.7  No hypermetabolic adenopathy in the chest.  Moderate-sized hiatal hernia. Coronary, aortic arch, and branch vessel atherosclerotic vascular disease.  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 Paraseptal emphysema.  ABDOMEN/PELVIS  Scattered regions likely physiologic bowel activity noted although some of the areas including at the junction of the descending and sigmoid colon have some focally accentuated metabolic activity which could conceivably reflect a small polyp, for example maximum SUV at the junction of the descending and sigmoid colon is 7.8 if there is some focally accentuated activity at the anus without CT correlate, probably physiologic.  Central prostate calcifications  noted. Hernia mesh along the lower anterior abdominopelvic wall.  Bilateral nonobstructive nephrolithiasis. Aortoiliac atherosclerotic vascular disease. Sigmoid colon diverticulosis.  SKELETON  No focal hypermetabolic activity to suggest skeletal metastasis.  IMPRESSION: 1. Hypermetabolic left upper lobe mass, maximum SUV 7.7, favoring malignancy. Central photopenia probably from central necrosis. Abscess or granulomatous infectious process is not totally excluded but significantly less likely. Assuming non-small cell lung cancer and visceral pleural extension, this represents T2a N0 M0 disease (stage IB). 2. There are some focal areas of hypermetabolic activity in the bowel, which are probably physiologic but which could possibly represent polyps. One of these is at the anus and the other is at the junction of the descending and sigmoid colon. 3. Other imaging findings of potential clinical significance: Moderate-sized hiatal hernia. Coronary, aortic arch, and branch vessel atherosclerotic vascular disease. Aortoiliac atherosclerotic vascular disease. Bilateral nonobstructive nephrolithiasis. Hernia mesh along the lower anterior abdominopelvic wall. Paraseptal emphysema. Sigmoid colon diverticulosis.   Electronically Signed By: Van Clines M.D. On: 01/04/2017 12:06    Pulmonary Function Test  Order: [id number]  Status:  Edited Result - FINAL Visible to patient:  Yes (MyChart) Next appt:  01/13/2017 at 10:30 AM in Cardiology Larae Grooms, MD) Dx:  Mass of upper lobe of left lung   Ref Range & Units 7d ago  FVC-Pre L 4.08   FVC-%Pred-Pre % 100   FVC-Post L 4.17   FVC-%Pred-Post % 102   FVC-%Change-Post % 2   FEV1-Pre L 3.43   FEV1-%Pred-Pre % 115   FEV1-Post L 3.53   FEV1-%Pred-Post % 118   FEV1-%Change-Post % 2   FEV6-Pre L 4.05   FEV6-%Pred-Pre % 106   FEV6-Post L 4.17   FEV6-%Pred-Post % 109   FEV6-%Change-Post % 3   Pre FEV1/FVC ratio  % 84   FEV1FVC-%Pred-Pre % 115   Post FEV1/FVC ratio % 85   FEV1FVC-%Change-Post % 0   Pre FEV6/FVC Ratio % 99   FEV6FVC-%Pred-Pre % 105   Post FEV6/FVC ratio % 100   FEV6FVC-%Pred-Post % 106   FEV6FVC-%Change-Post % 0   FEF 25-75 Pre L/sec 4.18   FEF2575-%Pred-Pre % 186   FEF 25-75 Post L/sec 4.38   FEF2575-%Pred-Post % 195   FEF2575-%Change-Post % 4   RV L 1.84   RV % pred % 77   TLC L 6.45   TLC % pred % 97   DLCO unc ml/min/mmHg 19.16   DLCO unc % pred % 64   DL/VA ml/min/mmHg/L 3.25   DL/VA % pred % 72   Resulting Agency  [facility]    Specimen Collected: 01/01/17 09:49 Last Resulted: 01/01/17 10:59              Scans on Order [id number]   Scan on 01/02/2017 12:40 PM by Tanda Rockers, MDScan on 01/02/2017 12:40 PM by Tanda Rockers, MD            Impression:  This 71 year old gentleman has a 4.3 cm left upper lobe lung mass that is hypermetabolic on PET scan and highly suspicious for a lung cancer. There is no evidence of metastatic disease on PET scan or MR of the brain. His pulmonary function appears adequate to allow a left upper lobectomy for removal. He has been seen by Dr. Irish Lack and given cardiac clearance for surgery. His EF by echo in 2015 was normal with mild LVH and grade 1 diastolic dysfunction. I reviewed his CT and PET scan images with him and discussed my surgical recommendations. I discussed the operative procedure with the patient and his friend including alternatives, benefits and risks; including but not limited to bleeding, blood transfusion, infection, respiratory failure, possible need for postop adjuvant therapy depending on the pathology results, pleural space complications and death.  Colbert Coyer understands and agrees to proceed.    Plan:  Flexible bronchoscopy and left thoracotomy for left upper lobectomy.   Gaye Pollack, MD [facility] [phone]

## 2017-02-01 NOTE — Progress Notes (Signed)
CRITICAL VALUE ALERT  Critical value received: ABG   Date of notification:  02/01/2017  Time of notification:  2000  Critical value read back:No.  Nurse who received alert:  Reinaldo Berber    MD notified (1st page):  CCM,  Georgann Housekeeper aware and orders received to monitor patient's mental status and arousability as it continues to improve  Time of first page:  n/a  MD notified (2nd page):  Time of second page:  Responding MD:  n/a  Time MD responded:  n/a

## 2017-02-01 NOTE — Transfer of Care (Signed)
Immediate Anesthesia Transfer of Care Note  Patient: Paul Valdez  Procedure(s) Performed: Procedure(s): FLEXIBLE BRONCHOSCOPY (N/A) THORACOTOMY/LEFT UPPER LOBECTOMY (Left)  Patient Location: PACU  Anesthesia Type:General  Level of Consciousness: awake, pateint uncooperative and lethargic  Airway & Oxygen Therapy: Patient Spontanous Breathing on CPAP  Post-op Assessment: Report given to RN and Post -op Vital signs reviewed and stable, Dr Gifford Shave present  Post vital signs: Reviewed and stable  Last Vitals:  HR 68 BP 150/82 RR 18 SpO2 94%  Last Pain:  Vitals:   02/01/17 0751  TempSrc: Oral         Complications: No apparent anesthesia complications

## 2017-02-02 ENCOUNTER — Encounter: Payer: Self-pay | Admitting: *Deleted

## 2017-02-02 ENCOUNTER — Encounter (HOSPITAL_COMMUNITY): Payer: Self-pay | Admitting: Surgery

## 2017-02-02 ENCOUNTER — Inpatient Hospital Stay (HOSPITAL_COMMUNITY): Payer: Medicare Other

## 2017-02-02 DIAGNOSIS — J9601 Acute respiratory failure with hypoxia: Secondary | ICD-10-CM

## 2017-02-02 LAB — GLUCOSE, CAPILLARY
GLUCOSE-CAPILLARY: 106 mg/dL — AB (ref 65–99)
GLUCOSE-CAPILLARY: 113 mg/dL — AB (ref 65–99)
GLUCOSE-CAPILLARY: 114 mg/dL — AB (ref 65–99)
GLUCOSE-CAPILLARY: 119 mg/dL — AB (ref 65–99)
GLUCOSE-CAPILLARY: 120 mg/dL — AB (ref 65–99)
Glucose-Capillary: 122 mg/dL — ABNORMAL HIGH (ref 65–99)
Glucose-Capillary: 91 mg/dL (ref 65–99)

## 2017-02-02 LAB — BASIC METABOLIC PANEL
Anion gap: 9 (ref 5–15)
Anion gap: 8 (ref 5–15)
BUN: 23 mg/dL — ABNORMAL HIGH (ref 6–20)
BUN: 22 mg/dL — ABNORMAL HIGH (ref 6–20)
CALCIUM: 8.2 mg/dL — AB (ref 8.9–10.3)
CHLORIDE: 104 mmol/L (ref 101–111)
CO2: 27 mmol/L (ref 22–32)
CO2: 25 mmol/L (ref 22–32)
CREATININE: 1.76 mg/dL — AB (ref 0.61–1.24)
Calcium: 8.2 mg/dL — ABNORMAL LOW (ref 8.9–10.3)
Chloride: 102 mmol/L (ref 101–111)
Creatinine, Ser: 1.43 mg/dL — ABNORMAL HIGH (ref 0.61–1.24)
GFR calc Af Amer: 43 mL/min — ABNORMAL LOW (ref >60)
GFR calc Af Amer: 55 mL/min — ABNORMAL LOW (ref >60)
GFR calc non Af Amer: 37 mL/min — ABNORMAL LOW (ref >60)
GFR calc non Af Amer: 48 mL/min — ABNORMAL LOW (ref >60)
GLUCOSE: 128 mg/dL — AB (ref 65–99)
Glucose, Bld: 114 mg/dL — ABNORMAL HIGH (ref 65–99)
POTASSIUM: 3.9 mmol/L (ref 3.5–5.1)
Potassium: 4.4 mmol/L (ref 3.5–5.1)
SODIUM: 137 mmol/L (ref 135–145)
Sodium: 138 mmol/L (ref 135–145)

## 2017-02-02 LAB — POCT I-STAT 3, ART BLOOD GAS (G3+)
Acid-Base Excess: 1 mmol/L (ref 0.0–2.0)
Acid-Base Excess: 1 mmol/L (ref 0.0–2.0)
Bicarbonate: 28.3 mmol/L — ABNORMAL HIGH (ref 20.0–28.0)
Bicarbonate: 27.6 mmol/L (ref 20.0–28.0)
O2 Saturation: 87 %
O2 Saturation: 90 %
PCO2 ART: 58.4 mmHg — AB (ref 32.0–48.0)
PCO2 ART: 54 mmHg — AB (ref 32.0–48.0)
PH ART: 7.294 — AB (ref 7.350–7.450)
PH ART: 7.318 — AB (ref 7.350–7.450)
Patient temperature: 99.1
TCO2: 30 mmol/L (ref 0–100)
TCO2: 29 mmol/L (ref 0–100)
pO2, Arterial: 61 mmHg — ABNORMAL LOW (ref 83.0–108.0)
pO2, Arterial: 64 mmHg — ABNORMAL LOW (ref 83.0–108.0)

## 2017-02-02 LAB — CBC
HCT: 37 % — ABNORMAL LOW (ref 39.0–52.0)
Hemoglobin: 11.8 g/dL — ABNORMAL LOW (ref 13.0–17.0)
MCH: 29.2 pg (ref 26.0–34.0)
MCHC: 31.9 g/dL (ref 30.0–36.0)
MCV: 91.6 fL (ref 78.0–100.0)
PLATELETS: 190 10*3/uL (ref 150–400)
RBC: 4.04 MIL/uL — ABNORMAL LOW (ref 4.22–5.81)
RDW: 14.7 % (ref 11.5–15.5)
WBC: 11.4 10*3/uL — ABNORMAL HIGH (ref 4.0–10.5)

## 2017-02-02 MED ORDER — ORAL CARE MOUTH RINSE: 15.0000 mL | Freq: Two times a day (BID) | OROMUCOSAL | Status: DC

## 2017-02-02 MED ORDER — DOPAMINE-DEXTROSE 3.2-5 MG/ML-% IV SOLN
1.0000 ug/kg/min | INTRAVENOUS | Status: DC
  Administered 2017-02-02: 3 ug/kg/min via INTRAVENOUS
  Administered 2017-02-04: 2 ug/kg/min via INTRAVENOUS
  Filled 2017-02-02 (×2): qty 250

## 2017-02-02 MED ORDER — FUROSEMIDE 10 MG/ML IJ SOLN
20.0000 mg | Freq: Once | INTRAMUSCULAR | Status: AC
  Administered 2017-02-02: 20 mg via INTRAVENOUS
  Filled 2017-02-02: qty 2

## 2017-02-02 MED ORDER — FUROSEMIDE 10 MG/ML IJ SOLN
40.0000 mg | Freq: Once | INTRAMUSCULAR | Status: AC
  Administered 2017-02-02: 40 mg via INTRAVENOUS
  Filled 2017-02-02: qty 4

## 2017-02-02 MED ORDER — CHLORHEXIDINE GLUCONATE CLOTH 2 % EX PADS
6.0000 | MEDICATED_PAD | Freq: Every day | CUTANEOUS | Status: DC
  Administered 2017-02-02 – 2017-02-07 (×5): 6 via TOPICAL

## 2017-02-02 MED ORDER — CHLORHEXIDINE GLUCONATE 0.12 % MT SOLN
15.0000 mL | Freq: Two times a day (BID) | OROMUCOSAL | Status: DC
Start: 2017-02-02 — End: 2017-02-02

## 2017-02-02 MED ORDER — SODIUM CHLORIDE 0.9% FLUSH
10.0000 mL | Freq: Two times a day (BID) | INTRAVENOUS | Status: DC
Start: 2017-02-02 — End: 2017-02-06
  Administered 2017-02-02: 10 mL
  Administered 2017-02-02 (×2): 40 mL
  Administered 2017-02-03 – 2017-02-05 (×4): 10 mL

## 2017-02-02 MED ORDER — GABAPENTIN 300 MG PO CAPS
600.0000 mg | ORAL_CAPSULE | Freq: Three times a day (TID) | ORAL | Status: DC
  Administered 2017-02-02 – 2017-02-12 (×25): 600 mg via ORAL
  Filled 2017-02-02 (×10): qty 2
  Filled 2017-02-02: qty 6
  Filled 2017-02-02 (×15): qty 2

## 2017-02-02 MED ORDER — SODIUM CHLORIDE 0.9% FLUSH
10.0000 mL | INTRAVENOUS | Status: DC | PRN
  Administered 2017-02-06: 10 mL
  Filled 2017-02-02: qty 40

## 2017-02-02 MED ORDER — SERTRALINE HCL 100 MG PO TABS
100.0000 mg | ORAL_TABLET | Freq: Every day | ORAL | Status: DC
  Administered 2017-02-02 – 2017-02-12 (×9): 100 mg via ORAL
  Filled 2017-02-02 (×10): qty 1

## 2017-02-02 MED ORDER — CHLORHEXIDINE GLUCONATE 0.12 % MT SOLN
15.0000 mL | Freq: Two times a day (BID) | OROMUCOSAL | Status: DC
  Administered 2017-02-03 – 2017-02-12 (×19): 15 mL via OROMUCOSAL
  Filled 2017-02-02 (×16): qty 15

## 2017-02-02 MED ORDER — ORAL CARE MOUTH RINSE
15.0000 mL | Freq: Two times a day (BID) | OROMUCOSAL | Status: DC
  Administered 2017-02-02 – 2017-02-12 (×14): 15 mL via OROMUCOSAL

## 2017-02-02 MED ORDER — ALBUTEROL SULFATE (2.5 MG/3ML) 0.083% IN NEBU: 2.5000 mg | INHALATION_SOLUTION | RESPIRATORY_TRACT | Status: DC | PRN

## 2017-02-02 MED ORDER — ORAL CARE MOUTH RINSE
15.0000 mL | Freq: Two times a day (BID) | OROMUCOSAL | Status: DC
  Administered 2017-02-02: 15 mL via OROMUCOSAL

## 2017-02-02 MED ORDER — ENOXAPARIN SODIUM 40 MG/0.4ML ~~LOC~~ SOLN
40.0000 mg | SUBCUTANEOUS | Status: DC
  Administered 2017-02-02 – 2017-02-10 (×9): 40 mg via SUBCUTANEOUS
  Filled 2017-02-02 (×9): qty 0.4

## 2017-02-02 NOTE — Care Management Note (Signed)
Case Management Note  Patient Details  Name: Paul Valdez MRN: [id number] Date of Birth: 09-May-1946  Subjective/Objective:       Pt is s/p Thoracotomy and Upper Lobectomy for possible Lung CA           Action/Plan:  PTA  independent from home alone.  CM will continue to follow for discharge needs   Expected Discharge Date:                  Expected Discharge Plan:  Home/Self Care  In-House Referral:     Discharge planning Services  CM Consult  Post Acute Care Choice:    Choice offered to:     DME Arranged:    DME Agency:     HH Arranged:    HH Agency:     Status of Service:  In process, will continue to follow  If discussed at Long Length of Stay Meetings, dates discussed:    Additional Comments:  Maryclare Labrador, RN 02/02/2017, 10:51 AM

## 2017-02-02 NOTE — Progress Notes (Signed)
PULMONARY / CRITICAL CARE MEDICINE   Name: Paul Valdez MRN: [id number] DOB: 11/10/1946    ADMISSION DATE:  02/01/2017 CONSULTATION DATE:  02/01/2017  REFERRING MD:  Dr. Roxan Hockey  CHIEF COMPLAINT:  Lethargy post op  HISTORY OF PRESENT ILLNESS:   71 year old male with PMH as below, which is significant for CAD, OSA in process of getting CPAP, and HTN. He was undergoing back pain workup for spine surgery and a 4cm LUL lung mass was discovered. CT confirmed finding and mass was suspicious for malignancy. No evidence of malignancy found on further imaging. He then presented to [facility] [address] 02/05 for elective left upper lobectomy. The procedure was successful without complication and he was extubated post-opertively, however, several hours later he developed lethargy and was minimally responsive. ABG was done and demonstrated severe respiratory acidosis and he was started on BiPAP. ABG did not initially improve with BiPAP so PCCM was consulted.  SUBJECTIVE:  He wore BiPAP for the entire night ABG with improving resp acidosis but not normalized Was started on dopa and received lasix last pm for decreased UOP   VITAL SIGNS: BP 109/67   Pulse 80   Temp 99.3 F (37.4 C) (Oral)   Resp 12   Wt 95.3 kg (210 lb)   SpO2 91%   BMI 30.13 kg/m   HEMODYNAMICS: CVP:  [8 mmHg-13 mmHg] 13 mmHg  VENTILATOR SETTINGS: FiO2 (%):  [40 %-70 %] 40 %  INTAKE / OUTPUT: I/O last 3 completed shifts: In: 3785.7 [I.V.:2985.7; IV DTOIZTIWP:809] Out: 2785 [Urine:2035; Blood:500; Chest Tube:250]  PHYSICAL EXAMINATION: General:  Obese male, in chair position, taking PO Neuro: awake and interacting, following commands, non-focal, good cough HEENT: Op clear, pupils normal Cardiovascular:  Regular no M, L chest tube to suction Lungs:  Coarse on the L, distant bilaterally Abdomen:  Obese, soft, BS+ Musculoskeletal:  No deformities Skin: no rash  LABS:  BMET  Recent Labs Lab 01/29/17 0846   02/01/17 1724 02/01/17 2200 02/02/17 0500  NA 134*  < > 138 136 138  K 4.4  < > 5.4* 5.0 4.4  CL 103  --   --  101 102  CO2 21*  --   --  24 27  BUN 18  --   --  21* 23*  CREATININE 0.90  --   --  1.96* 1.76*  GLUCOSE 114*  --  166* 133* 128*  < > = values in this interval not displayed.  Electrolytes  Recent Labs Lab 01/29/17 0846 02/01/17 2200 02/02/17 0500  CALCIUM 9.4 8.3* 8.2*    CBC  Recent Labs Lab 01/29/17 0846  02/01/17 1724 02/01/17 2200 02/02/17 0500  WBC 9.4  --   --  18.6* 11.4*  HGB 13.9  < > 13.9 12.3* 11.8*  HCT 42.2  < > 41.0 38.9* 37.0*  PLT 249  --   --  264 190  < > = values in this interval not displayed.  Coag's  Recent Labs Lab 01/29/17 0846  APTT 33  INR 0.97    Sepsis Markers No results for input(s): LATICACIDVEN, PROCALCITON, O2SATVEN in the last 168 hours.  ABG  Recent Labs Lab 02/01/17 1950 02/01/17 2202 02/02/17 0458  PHART 7.188* 7.238* 7.294*  PCO2ART 75.7* 62.8* 58.4*  PO2ART 86.0 70.0* 61.0*    Liver Enzymes  Recent Labs Lab 01/29/17 0846  AST 16  ALT 16*  ALKPHOS 65  BILITOT 0.6  ALBUMIN 3.8    Cardiac Enzymes No results for  input(s): TROPONINI, PROBNP in the last 168 hours.  Glucose  Recent Labs Lab 02/01/17 1932 02/01/17 2328 02/02/17 0455 02/02/17 0718  GLUCAP 145* 122* 120* 106*    Imaging Dg Chest Port 1 View  Result Date: 02/02/2017 CLINICAL DATA:  Status post left upper lobectomy yesterday EXAM: PORTABLE CHEST 1 VIEW COMPARISON:  Portable chest x-ray of February 01, 2017 FINDINGS: The tiny left apical pneumothorax is not clearly evident today. The 2 chest tubes remain present with their tips in the pulmonary apex on the left. The right lung remains hypoinflated. The interstitial markings of both lungs remain coarse. The cardiac silhouette is enlarged but indistinct. The pulmonary vascularity is indistinct. IMPRESSION: Persistent bilateral hypoinflation with interstitial edema or less  likely pneumonia. Right basilar atelectasis is suspected. No large pleural effusion. The left-sided pneumothorax is not clearly visible today. Stable cardiomegaly with mild central pulmonary vascular prominence. Electronically Signed   By: David  Martinique M.D.   On: 02/02/2017 07:34   Dg Chest Port 1 View  Result Date: 02/01/2017 CLINICAL DATA:  Status post left upper lobectomy today EXAM: PORTABLE CHEST 1 VIEW COMPARISON:  PA and lateral chest x-ray of January 29, 2017 FINDINGS: The lung volumes are low. There is bibasilar atelectasis. There is a small left apical pneumothorax amounting to 5% or less of the lung volume. The 2 left-sided chest tubes have their tips in the left apex. There is subcutaneous emphysema in the left axillary region. The cardiac silhouette is mildly enlarged and indistinct. The pulmonary vascularity is mildly engorged. The right internal jugular venous catheter tip projects over the midportion of the SVC. There are surgical clips in the left hilar region. IMPRESSION: Mild hypoinflation greatest on the right. Bibasilar atelectasis or less likely infiltrate. Tiny left apical pneumothorax. There are 2 chest tubes with their tips in the apex. Electronically Signed   By: David  Martinique M.D.   On: 02/01/2017 15:15     STUDIES:  CXR 02/05 > small apical left PTX. Bibasilar atx.  CULTURES: None.  ANTIBIOTICS: vanco x 1 post-op Cefuroxime x1   SIGNIFICANT EVENTS: 02/05 > elective LUL lobectomy.  LINES/TUBES: None.  DISCUSSION: 71 y.o. M who underwent elective LUL lobectomy 02/05 (Dr. Cyndia Bent).  Later that evening had hypersomnolence > found to have acute hypercarbic respiratory failure therefore started on BiPAP.  ASSESSMENT / PLAN:  PULMONARY A: Acute hypercarbic respiratory failure secondary to OSA decompensation in setting of post-operative narcotics LUL mass - s/pelective lobectomy 02/05. Small left apical PTX following surgery - chest tubes already in  place. Bibasilar atelectasis. Hx OSA (not on CPAP), former tobacco use. P:   Change BiPAp to qhs and prn Minimize sedating meds as able.  Chest tube per TCTS  IS and flutter valve Follow CXR May benefit from scheduled BD's given possible hx COPD, spirometry 01/01/17 without overt obstruction Will need to stop smoking  CARDIOVASCULAR A:  Hx PVD, HTN, HLD, CAD, atypical chest pain. P:  Continue preadmission atorvastatin. Hold lisinopril until hemodynamics and renal fxn improve  RENAL A:   Acute oliguric renal failure, ? Period of hypoxemia / hypoperfusion P:   Wean dopa to off per Dr Vivi Martens plans.  Follow UOP and BMP off lasix, dose prn  GASTROINTESTINAL A:   GERD. Nutrition. 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 No acute issues P:  Follow surgical pathology. Further  recs per oncology (Is followed by Dr. Earlie Server). Follow CBC  INFECTIOUS A:   No acute issues. P:   Follow off abx for now  ENDOCRINE A:   No acute issues. P:   No interventions required.  NEUROLOGIC A:   Low back pain - was previously being worked up for surgery (Dr. Ronnald Ramp).  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 Hx depression. P:   Has PCA narcotics - attempt to minimize to avoid resp suppression.  Ultram ordered Restart home sertraline and gabapentin  FAMILY  - Updates: None available.  - Inter-disciplinary family meet or Palliative Care meeting due by:  02/11.  Time spent 35 minutes  Baltazar Apo, MD, PhD 02/02/2017, 9:54 AM [facility] [phone] or if no answer [phone]

## 2017-02-02 NOTE — Op Note (Signed)
CARDIOTHORACIC SURGERY OPERATIVE NOTE 02/01/2017 Paul Valdez [id number]  Surgeon:  Gaye Pollack, MD  First Assistant: Jadene Pierini, PA-C    Preoperative Diagnosis:  Left upper lobe lung nodule  Postoperative Diagnosis:  same  Procedure:  1.  Flexible video bronchoscopy 2.  Left muscle-sparing thoracotomy 3.  Left upper lobectomy   Anesthesia:  General Endotracheal   Clinical History/Surgical Indication:  The patient is a 71 year old previous heavy smoker with hypertension, hyperlipidemia, osteoarthritis, OSA and depression who has been having low back pain for two years and had an MRI in October 2017 showing lumbar spine disease. He was being worked up for spine surgery and a CXR showed a 4 cm LUL lung mass. CT of the chest on 12/11/2016 showed a 4.3 x 3.4 x 3.8 cm mass suspicious for lung cancer. There was a large right diaphragmatic eventration and right posterior diaphragmatic hernia. There was a large hiatal hernia with sliding and paraesophageal components as well as coronary and aortic atherosclerosis. He reports some shortness of breath at rest that is increased with exertion. He feels like this is because he has not been very active due to his back pain. He denies any cough or sputum production. He has been eating well and has had no weight loss.  This 71 year old gentleman has a 4.3 cm left upper lobe lung mass that is hypermetabolic on PET scan and highly suspicious for a lung cancer. There is no evidence of metastatic disease on PET scan or MR of the brain. His pulmonary function appears adequate to allow a left upper lobectomy for removal. He has been seen by Dr. Irish Valdez and given cardiac clearance for surgery. His EF by echo in 2015 was normal with mild LVH and grade 1 diastolic dysfunction. I reviewed his CT and PET scan images with him and discussed my surgical recommendations. I discussed the operative procedure with the patient and his friendincluding alternatives,  benefits and risks; including but not limited to bleeding, blood transfusion, infection, respiratory failure, possible need for postop adjuvant therapy depending on the pathology results, pleural space complicationsand death. Paul Divine Simonsunderstands and agrees to proceed.    Preparation:  The patient was seen in the preoperative holding area and the correct patient, correct operation were confirmed with the patient after reviewing the medical record and xrays. The left side of the chest was signed by me. The consent was signed by me. Preoperative antibiotics were given. A radial arterial line was placed by the anesthesia team. The patient was taken back to the operating room and positioned supine on the operating room table. After being placed under general endotracheal anesthesia by the anesthesia team using a single lumen tube a foley catheter was placed. Lower extremity SCD's were placed. A time out was taken and the correct patient, operation and operative side were confirmed with nursing and anesthesia staff. Flexible bronchoscopy was performed as noted below. Then the single lumen tube was converted to a double lumen tube and the patient was turned into the right lateral decubitus position with the left side up. The chest was prepped with betadine soap and solution and draped in the usual sterile manner. A surgical time-out was taken and the correct patient and operative procedure and operative side were confirmed with the nursing and anesthesia staff.   Flexible Bronchoscopy:  The video bronchoscope was passed down the endotracheal tube. The distal trachea was normal. The carina was sharp. The left bronchial tree had normal segmental anatomy  with no endobronchial lesions or extrinsic compression. The right bronchial tree had normal segmental anatomy with no endobronchial lesions or extrinsic compression.   left muscle-sparing thoracotomy and left upper lobectomy:  The chest was entered  through a lateral muscle-sparing thoracotomy incision. The pleural space was entered through the 4th ICS. The pleural space was unremarkable. The mass was visible and palpable in the left upper lobe. There were a couple areas where it appeared that the tumor had invaded through the visceral pleura and looked like an ulceration. The fissure was incomplete. The pulmonary artery branches to the left upper lobe were identified, encircled with a vessel loop and divided with a vascular stapler.  The fissure was opened and the lingular branches were divided in a similar manner. The left superior pulmonary vein was divided using a vascular stapler. The fissure was completed using 60 mm medium purple staplers. The left upper lobe bronchus was identified. There were multiple hard lymph nodes adjacent to the bronchus and they were dissected free and taken with the specimen. The left upper lobe bronchus was encircled with a TA-30 stapler with 4.8 mm staples. The left lung was inflated and the lower lobe inflated easily. The stapler was fired and the bronchus divided with a knife. There was a separate lingular bronchus and this was divided in a similar manner.  The specimen was sent to pathology.  Hemostasis was complete. The chest was filled with warm saline and the bronchial stump tested at 30 cm pressure. There was no air leak from the bronchus. The fissure staple line was coated with Coseal. The inferior pulmonary ligament was divided. The posterior mediastinal attachments to the left lower lobe were left intact to prevent torsion of the lobe.   Completion:  A 32 F chest tube was placed in the pleural space posteriorly and a 28 F chest tube anteriorly. The ribs were reapproximated with # 2 vicryl pericostal sutures with the sutures placed through holes drilled in the 5th rib and placed around the 4th rib. The muscles were returned to their normal anatomic position. The subcutaneous tissue was closed with 2-0 vicryl  suture and the skin with 3-0 vicryl subcuticular suture. The sponge, needle, and instrument counts were correct according to the nurses. Dry sterile dressings were applied and the chest tubes were connected to pleurevac suction. The patient was turned into the supine position, extubated, and transferred to the PACU in satisfactory and stable condition.

## 2017-02-02 NOTE — Progress Notes (Signed)
Patient ID: Paul Valdez, male   DOB: 1946/08/24, 71 y.o.   MRN: [id number] SICU Evening Rounds:  Hemodynamically stable in sinus rhythm.  He was on [place] all day and now back on CPAP with sats 97%  Diuresed well today. Creat down. BMET    Component Value Date/Time   NA 137 02/02/2017 1616   K 3.9 02/02/2017 1616   CL 104 02/02/2017 1616   CO2 25 02/02/2017 1616   GLUCOSE 114 (H) 02/02/2017 1616   BUN 22 (H) 02/02/2017 1616   CREATININE 1.43 (H) 02/02/2017 1616   CALCIUM 8.2 (L) 02/02/2017 1616   GFRNONAA 48 (L) 02/02/2017 1616   GFRAA 55 (L) 02/02/2017 1616   ABG ok this pm: 7.32, PCO2 54, PO2 64, 90% on 6LNC  Continue attention to pulmonary toilet.

## 2017-02-02 NOTE — Progress Notes (Addendum)
1 Day Post-Op Procedure(s) (LRB): FLEXIBLE BRONCHOSCOPY (N/A) THORACOTOMY/LEFT UPPER LOBECTOMY (Left) Subjective:  Feels better this am. Pain controlled.   Had a rough initial postop course requiring bipap with hypercarbic, hypoxemic respiratory failure. This has improved and ABG this am on bipap looked much better. He is now on 6 LNC. He also developed acute postop oliguric renal failure with rise in creat from 0.98 preop to 1.96 last night and low urine output. CVP 13 after 500 cc albumin and given 20 mg lasix and started on dopamine 2-3 mcg.This resulted in good diuresis and drop in creat this am. I am sure this also helped his pulmonary status.  Objective: Vital signs in last 24 hours: Temp:  [97 F (36.1 C)-99.3 F (37.4 C)] 99.3 F (37.4 C) (02/06 0721) Pulse Rate:  [57-82] 74 (02/06 0700) Cardiac Rhythm: Normal sinus rhythm (02/05 2100) Resp:  [10-25] 11 (02/06 0700) BP: (87-165)/(56-100) 114/65 (02/06 0700) SpO2:  [87 %-96 %] 91 % (02/06 0700) Arterial Line BP: (92-180)/(41-87) 160/56 (02/06 0700) FiO2 (%):  [40 %-70 %] 40 % (02/06 0310) Weight:  [95.3 kg (210 lb)] 95.3 kg (210 lb) (02/05 0751)  Hemodynamic parameters for last 24 hours: CVP:  [8 mmHg-13 mmHg] 8 mmHg  Intake/Output from previous day: 02/05 0701 - 02/06 0700 In: 3680.3 [I.V.:2880.3; IV Piggyback:800] Out: 2785 [Urine:2035; Blood:500; Chest Tube:250] Intake/Output this shift: No intake/output data recorded.  General appearance: alert and cooperative Neurologic: intact Heart: regular rate and rhythm, S1, S2 normal, no murmur, click, rub or gallop Lungs: clear to auscultation bilaterally Abdomen: soft, hypoactive BS. baseline protuberance Extremities: extremities normal, atraumatic, no cyanosis or edema Wound: dressing dry. chest tubes with no air leak.  Lab Results:  Recent Labs  02/01/17 2200 02/02/17 0500  WBC 18.6* 11.4*  HGB 12.3* 11.8*  HCT 38.9* 37.0*  PLT 264 190   BMET:  Recent Labs  02/01/17 2200 02/02/17 0500  NA 136 138  K 5.0 4.4  CL 101 102  CO2 24 27  GLUCOSE 133* 128*  BUN 21* 23*  CREATININE 1.96* 1.76*  CALCIUM 8.3* 8.2*    PT/INR: No results for input(s): LABPROT, INR in the last 72 hours. ABG    Component Value Date/Time   PHART 7.294 (L) 02/02/2017 0458   HCO3 28.3 (H) 02/02/2017 0458   TCO2 30 02/02/2017 0458   ACIDBASEDEF 2.0 02/01/2017 2202   O2SAT 87.0 02/02/2017 0458   CBG (last 3)   Recent Labs  02/01/17 2328 02/02/17 0455 02/02/17 0718  GLUCAP 122* 120* 106*   CLINICAL DATA:  Status post left upper lobectomy yesterday  EXAM: PORTABLE CHEST 1 VIEW  COMPARISON:  Portable chest x-ray of February 01, 2017  FINDINGS: The tiny left apical pneumothorax is not clearly evident today. The 2 chest tubes remain present with their tips in the pulmonary apex on the left. The right lung remains hypoinflated. The interstitial markings of both lungs remain coarse. The cardiac silhouette is enlarged but indistinct. The pulmonary vascularity is indistinct.  IMPRESSION: Persistent bilateral hypoinflation with interstitial edema or less likely pneumonia. Right basilar atelectasis is suspected. No large pleural effusion. The left-sided pneumothorax is not clearly visible today. Stable cardiomegaly with mild central pulmonary vascular prominence.   Electronically Signed   By: David  Martinique M.D.   On: 02/02/2017 07:34  Assessment/Plan: S/P Procedure(s) (LRB): FLEXIBLE BRONCHOSCOPY (N/A) THORACOTOMY/LEFT UPPER LOBECTOMY (Left)  He is hemodynamically stable this am in sinus rhythm on dopamine 3 mcg. Will continue this today.  Acute  postop hypercarbic, hypoxemic respiratory failure: improved this am. His preop PFT's were ok with mild diffusion defect but his RA ABG showed a sat of 91% with PCO2 of 41. He has a barrel chest and obese with some eventration of the right hemidiaphragm. Sats dropped into the high 80's on one lung  ventilation. He is improved this am and will need to keep on the dry side and work hard on IS, flutter valve. Pain is controlled. CCM is following.  Acute postop renal failure: Etiology is not clear to me unless it was related to some transient low cardiac output from marked respiratory acidosis. He was never hypotensive during surgery. On lisinopril preop. It is improving with improvement in respiratory status and dopamine.  Start Lovenox for DVT prophylaxis and continue SCD's.   LOS: 1 day    Gaye Pollack 02/02/2017

## 2017-02-02 NOTE — Progress Notes (Signed)
Pt with increased WOB, Decreased spo2, and expiratory wheeze throughout. Pt placed on Bipap (16/8 R 14, 60%) at this time. Pt states he feels better with that on and will wear for awhile. RN at bedside. RT will continue to closely monitor pt.

## 2017-02-03 ENCOUNTER — Inpatient Hospital Stay (HOSPITAL_COMMUNITY): Payer: Medicare Other

## 2017-02-03 DIAGNOSIS — J9600 Acute respiratory failure, unspecified whether with hypoxia or hypercapnia: Secondary | ICD-10-CM

## 2017-02-03 LAB — POCT I-STAT 3, ART BLOOD GAS (G3+)
Acid-Base Excess: 1 mmol/L (ref 0.0–2.0)
Acid-base deficit: 2 mmol/L (ref 0.0–2.0)
Bicarbonate: 27.6 mmol/L (ref 20.0–28.0)
Bicarbonate: 25.7 mmol/L (ref 20.0–28.0)
O2 SAT: 91 %
O2 SAT: 93 %
PCO2 ART: 52.1 mmHg — AB (ref 32.0–48.0)
PCO2 ART: 56.6 mmHg — AB (ref 32.0–48.0)
PH ART: 7.265 — AB (ref 7.350–7.450)
PO2 ART: 68 mmHg — AB (ref 83.0–108.0)
Patient temperature: 99.1
TCO2: 29 mmol/L (ref 0–100)
TCO2: 27 mmol/L (ref 0–100)
pH, Arterial: 7.334 — ABNORMAL LOW (ref 7.350–7.450)
pO2, Arterial: 80 mmHg — ABNORMAL LOW (ref 83.0–108.0)

## 2017-02-03 LAB — COMPREHENSIVE METABOLIC PANEL
ALT: 18 U/L (ref 17–63)
AST: 31 U/L (ref 15–41)
Albumin: 3.1 g/dL — ABNORMAL LOW (ref 3.5–5.0)
Alkaline Phosphatase: 59 U/L (ref 38–126)
Anion gap: 10 (ref 5–15)
BILIRUBIN TOTAL: 0.7 mg/dL (ref 0.3–1.2)
BUN: 19 mg/dL (ref 6–20)
CHLORIDE: 99 mmol/L — AB (ref 101–111)
CO2: 27 mmol/L (ref 22–32)
Calcium: 8.4 mg/dL — ABNORMAL LOW (ref 8.9–10.3)
Creatinine, Ser: 1.14 mg/dL (ref 0.61–1.24)
GFR calc Af Amer: >60 mL/min (ref >60)
Glucose, Bld: 113 mg/dL — ABNORMAL HIGH (ref 65–99)
Potassium: 4.2 mmol/L (ref 3.5–5.1)
Sodium: 136 mmol/L (ref 135–145)
TOTAL PROTEIN: 6.2 g/dL — AB (ref 6.5–8.1)

## 2017-02-03 LAB — GLUCOSE, CAPILLARY
GLUCOSE-CAPILLARY: 112 mg/dL — AB (ref 65–99)
GLUCOSE-CAPILLARY: 118 mg/dL — AB (ref 65–99)

## 2017-02-03 LAB — CBC
HEMATOCRIT: 37.1 % — AB (ref 39.0–52.0)
Hemoglobin: 11.9 g/dL — ABNORMAL LOW (ref 13.0–17.0)
MCH: 29.2 pg (ref 26.0–34.0)
MCHC: 32.1 g/dL (ref 30.0–36.0)
MCV: 91.2 fL (ref 78.0–100.0)
Platelets: 222 10*3/uL (ref 150–400)
RBC: 4.07 MIL/uL — AB (ref 4.22–5.81)
RDW: 14.7 % (ref 11.5–15.5)
WBC: 12.4 10*3/uL — AB (ref 4.0–10.5)

## 2017-02-03 MED ORDER — METOPROLOL TARTRATE 5 MG/5ML IV SOLN
5.0000 mg | Freq: Once | INTRAVENOUS | Status: AC
  Administered 2017-02-03: 5 mg via INTRAVENOUS
  Filled 2017-02-03: qty 5

## 2017-02-03 MED ORDER — FUROSEMIDE 10 MG/ML IJ SOLN
40.0000 mg | Freq: Once | INTRAMUSCULAR | Status: AC
  Administered 2017-02-03: 40 mg via INTRAVENOUS
  Filled 2017-02-03: qty 4

## 2017-02-03 NOTE — Progress Notes (Signed)
PULMONARY / CRITICAL CARE MEDICINE   Name: Paul Valdez MRN: [id number] DOB: 1946/06/12    ADMISSION DATE:  02/01/2017 CONSULTATION DATE:  02/01/2017  REFERRING MD:  Dr. Roxan Hockey  CHIEF COMPLAINT:  Lethargy post op  Brief:   71 year old male with PMH as below, which is significant for CAD, OSA in process of getting CPAP, and HTN.   Undergoing back pain workup for spine surgery and a 4cm LUL lung mass was discovered - Imaging . He then presented to [facility] 02/05 for elective left upper lobectomy. The procedure was successful without complication and he was extubated post-opertively, however, several hours later he developed lethargy and was minimally responsive. ABG was done and demonstrated severe respiratory acidosis and he was started on BiPAP. ABG did not initially improve with BiPAP so PCCM was consulted.  SUBJECTIVE:  On/Off Bipap throughout night, this morning Bipap off and patient got up to chair, decompensated, HR 160s.    VITAL SIGNS: BP 119/73   Pulse 96   Temp 99.1 F (37.3 C) (Axillary)   Resp 17   Ht '5\' 10"'$  (1.778 m)   Wt 95.3 kg (210 lb)   SpO2 92%   BMI 30.13 kg/m   HEMODYNAMICS: CVP:  [11 mmHg-16 mmHg] 13 mmHg  VENTILATOR SETTINGS: FiO2 (%):  [40 %-60 %] 40 %  INTAKE / OUTPUT: I/O last 3 completed shifts: In: 4057.4 [P.O.:660; I.V.:2797.4; IV Piggyback:600] Out: 8502 [DXAJO:8786; Chest Tube:370]  PHYSICAL EXAMINATION: General:  Obese male, in chair position, on BIPAP Neuro: Alert, oriented to self and place, following commands HEENT: Normocephalic  Cardiovascular:  Tachy, no MRG, NI S1/S2, L chest tube to suction Lungs:  Distant breath sounds to left upper, coarse throughout Abdomen:  Obese, non-tender, non-distended, active bowel sounds  Musculoskeletal:  No deformities Skin: no rash  LABS:  BMET  Recent Labs Lab 02/02/17 0500 02/02/17 1616 02/03/17 0350  NA 138 137 136  K 4.4 3.9 4.2  CL 102 104 99*  CO2 '27 25 27  '$ BUN 23* 22*  19  CREATININE 1.76* 1.43* 1.14  GLUCOSE 128* 114* 113*    Electrolytes  Recent Labs Lab 02/02/17 0500 02/02/17 1616 02/03/17 0350  CALCIUM 8.2* 8.2* 8.4*    CBC  Recent Labs Lab 02/01/17 2200 02/02/17 0500 02/03/17 0350  WBC 18.6* 11.4* 12.4*  HGB 12.3* 11.8* 11.9*  HCT 38.9* 37.0* 37.1*  PLT 264 190 222    Coag's  Recent Labs Lab 01/29/17 0846  APTT 33  INR 0.97    Sepsis Markers No results for input(s): LATICACIDVEN, PROCALCITON, O2SATVEN in the last 168 hours.  ABG  Recent Labs Lab 02/02/17 0458 02/02/17 1619 02/03/17 0734  PHART 7.294* 7.318* 7.334*  PCO2ART 58.4* 54.0* 52.1*  PO2ART 61.0* 64.0* 68.0*    Liver Enzymes  Recent Labs Lab 01/29/17 0846 02/03/17 0350  AST 16 31  ALT 16* 18  ALKPHOS 65 59  BILITOT 0.6 0.7  ALBUMIN 3.8 3.1*    Cardiac Enzymes No results for input(s): TROPONINI, PROBNP in the last 168 hours.  Glucose  Recent Labs Lab 02/02/17 1125 02/02/17 1506 02/02/17 1919 02/02/17 2337 02/03/17 0339 02/03/17 0728  GLUCAP 91 113* 114* 119* 112* 118*    Imaging Dg Chest Port 1 View  Result Date: 02/03/2017 CLINICAL DATA:  Chest tube, post lobectomy, shortness of Breath EXAM: PORTABLE CHEST 1 VIEW COMPARISON:  02/02/2017 FINDINGS: Very low lung volumes. Two left chest tubes and right central line remain in place, unchanged. No pneumothorax.  Cardiomegaly with bilateral airspace disease. No acute bony abnormality. IMPRESSION: Cardiomegaly, diffuse bilateral airspace disease with very low lung volumes. No change since prior study. Electronically Signed   By: Rolm Baptise M.D.   On: 02/03/2017 08:32     STUDIES:  CXR 02/05 > small apical left PTX. Bibasilar atx.  CULTURES: None.  ANTIBIOTICS: vanco x 1 post-op Cefuroxime x1   SIGNIFICANT EVENTS: 02/05 > elective LUL lobectomy.  LINES/TUBES: None.  DISCUSSION: 71 y.o. M who underwent elective LUL lobectomy 02/05 (Dr. Cyndia Bent).  Later that evening had  hypersomnolence > found to have acute hypercarbic respiratory failure therefore started on BiPAP.  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 Small left apical PTX following surgery - chest tubes already in place. Bibasilar atelectasis. Hx OSA (not on CPAP), former tobacco use. P:   ABG now BIPAP > Wean as tolerated > does not tolerate when off  Chest tube per TCTS  Pulmonary Hygiene > IS and flutter valve Follow CXR May benefit from scheduled BD's given possible hx COPD, spirometry 01/01/17 without overt obstruction  CARDIOVASCULAR A:  Hx PVD, HTN, HLD, CAD, atypical chest pain. P:  Continue preadmission atorvastatin. Hold lisinopril until hemodynamics and renal fxn improve  RENAL A:   Acute oliguric renal failure, ? Period of hypoxemia / hypoperfusion P:   Wean dopa to off per Dr Vivi Martens plans.  Follow UOP and BMP off lasix, dose prn  GASTROINTESTINAL A:   GERD. Nutrition. 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 - extension noted throughout visceral pleura  No acute issues P:  Further recs per oncology (Is followed by Dr. Earlie Server). Follow CBC  INFECTIOUS A:   No acute issues. P:   Follow off abx for now  ENDOCRINE A:   No acute issues. P:   No interventions required.  NEUROLOGIC A:   Low back pain - was previously being worked up for surgery (Dr. Ronnald Ramp).  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 Hx depression. P:   Has PCA narcotics - attempt to minimize to avoid resp suppression.  Ultram ordered Continue home sertraline and gabapentin  FAMILY  - Updates: None available.  - Inter-disciplinary family meet or Palliative Care meeting due by:  02/11.  Time spent 32 minutes  Hayden Pedro,  AG-ACNP [facility]  Pgr: [phone]  PCCM Pgr: [phone]   Attending Note:  I have examined patient, reviewed labs, studies and notes. I have discussed the case with Beaulah Corin, and I agree with the data and plans as amended above.   Patient is a 71 year old man with a history of coronary disease, obstructive sleep apnea, hypertension, and probable mild obstructive lung disease. He underwent left upper lobe lobectomy on 2/5 for a squamous cell lung cancer with visceral pleural invasion. Postop course has been complicated by hypoventilation and combined hypoxemic and hypercapnic respiratory failure. He has responded well to BiPAP. He wore it for the night last night. He continues to have low lung volumes, confirmed on chest x-ray 2/7. Also with acute renal insufficiency presumed due to hypoperfusion, normalizing on 2/7. On eval today he is comfortable, has small breaths even on BiPAP. Agree with plans to wean dopamine, continue diuresis as he can tolerate. We will continue to  push incentive spirometry and pulmonary hygiene. Use BiPAP when necessary.  Independent critical care time is 32 minutes.   Baltazar Apo, MD, PhD 02/03/2017, 1:05 PM [facility] [phone] or if no answer [phone]

## 2017-02-03 NOTE — Progress Notes (Signed)
Pt placed back on bipap by RN due to pt having increase in HR into the 150s.  RT followed up with pt and is tolerating bipap well on previous settings.  Vital signs stable, RT will continue to monitor.

## 2017-02-03 NOTE — Progress Notes (Signed)
Pt taken off bipap and placed on HFNC at 13lpm.  Pt tolerating well, sats 93, HR 101.  Pt encouraged to take deep breaths through his nose.  RN at bedside.  RT will continue to monitor.

## 2017-02-03 NOTE — Progress Notes (Signed)
Patient ID: Paul Valdez, male   DOB: Feb 01, 1946, 71 y.o.   MRN: [id number] EVENING ROUNDS NOTE :     [address]       [place] [address]             [phone]                 2 Days Post-Op Procedure(s) (LRB): FLEXIBLE BRONCHOSCOPY (N/A) THORACOTOMY/LEFT UPPER LOBECTOMY (Left)  Total Length of Stay:  LOS: 2 days  BP 127/66   Pulse 84   Temp 97.9 F (36.6 C) (Axillary)   Resp (!) 22   Ht _0  (1.778 m)   Wt 210 lb (95.3 kg)   SpO2 (!) 89%   BMI 30.13 kg/m   .Intake/Output      02/06 0701 - 02/07 0700 02/07 0701 - 02/08 0700   P.O. 660    I.V. (mL/kg) 1626.7 (17.1) 207.8 (2.2)   IV Piggyback 50    Total Intake(mL/kg) 2336.7 (24.5) 207.8 (2.2)   Urine (mL/kg/hr) 2995 (1.3) 1035 (0.9)   Blood     Chest Tube 230 (0.1) 50 (0)   Total Output 3225 1085   Net -888.3 -877.2          . sodium chloride 10 mL/hr at 02/03/17 1800  . DOPamine 2 mcg/kg/min (02/03/17 1800)     Lab Results  Component Value Date   WBC 12.4 (H) 02/03/2017   HGB 11.9 (L) 02/03/2017   HCT 37.1 (L) 02/03/2017   PLT 222 02/03/2017   GLUCOSE 113 (H) 02/03/2017   ALT 18 02/03/2017   AST 31 02/03/2017   NA 136 02/03/2017   K 4.2 02/03/2017   CL 99 (L) 02/03/2017   CREATININE 1.14 02/03/2017   BUN 19 02/03/2017   CO2 27 02/03/2017   INR 0.97 01/29/2017   No air leak , on bipap   Grace Isaac MD  Beeper [phone] Office [phone] 02/03/2017 6:58 PM

## 2017-02-03 NOTE — Progress Notes (Signed)
2 Days Post-Op Procedure(s) (LRB): FLEXIBLE BRONCHOSCOPY (N/A) THORACOTOMY/LEFT UPPER LOBECTOMY (Left) Subjective:  No specific complaints  Has been on and off bipap overnight. He breaths better on bipap but feels dried out.  Objective: Vital signs in last 24 hours: Temp:  [97.9 F (36.6 C)-99.1 F (37.3 C)] 99.1 F (37.3 C) (02/07 0700) Pulse Rate:  [80-111] 96 (02/07 0739) Cardiac Rhythm: Normal sinus rhythm (02/07 0700) Resp:  [11-22] 20 (02/07 0739) BP: (82-141)/(57-93) 119/73 (02/07 0700) SpO2:  [89 %-97 %] 93 % (02/07 0739) Arterial Line BP: (102-189)/(40-80) 146/58 (02/07 0700) FiO2 (%):  [40 %-60 %] 40 % (02/07 0700)  Hemodynamic parameters for last 24 hours: CVP:  [11 mmHg-16 mmHg] 16 mmHg  Intake/Output from previous day: 02/06 0701 - 02/07 0700 In: 2336.7 [P.O.:660; I.V.:1626.7; IV Piggyback:50] Out: 3225 [Urine:2995; Chest Tube:230] Intake/Output this shift: No intake/output data recorded.  General appearance: alert and cooperative Heart: regular rate and rhythm, S1, S2 normal, no murmur, click, rub or gallop Lungs: clear to auscultation bilaterally Extremities: extremities normal, atraumatic, no cyanosis or edema Wound: dressings dry.  no air leak from chest tube  Lab Results:  Recent Labs  02/02/17 0500 02/03/17 0350  WBC 11.4* 12.4*  HGB 11.8* 11.9*  HCT 37.0* 37.1*  PLT 190 222   BMET:  Recent Labs  02/02/17 1616 02/03/17 0350  NA 137 136  K 3.9 4.2  CL 104 99*  CO2 25 27  GLUCOSE 114* 113*  BUN 22* 19  CREATININE 1.43* 1.14  CALCIUM 8.2* 8.4*    PT/INR: No results for input(s): LABPROT, INR in the last 72 hours. ABG    Component Value Date/Time   PHART 7.334 (L) 02/03/2017 0734   HCO3 27.6 02/03/2017 0734   TCO2 29 02/03/2017 0734   ACIDBASEDEF 2.0 02/01/2017 2202   O2SAT 91.0 02/03/2017 0734   CBG (last 3)   Recent Labs  02/02/17 2337 02/03/17 0339 02/03/17 0728  GLUCAP 119* 112* 118*   CXR: low lung  volumes  Assessment/Plan: S/P Procedure(s) (LRB): FLEXIBLE BRONCHOSCOPY (N/A) THORACOTOMY/LEFT UPPER LOBECTOMY (Left)  He is hemodynamically stable on dop 3. Will decrease to 2 mcg. CVP is 16. Will diurese further today.  Respiratory status remains tenuous. He desats quickly off bipap but does tolerate high flow [place]. He is not breathing deeply due to pain from surgery, barrel chest, COPD. Will need to continue PCA, Bipap as needed. Remove posterior chest tube.  Renal function continues to improve and almost back to normal.  Path shows T2a, N0 poorly diff squamous cell carcinoma with extension through the visceral pleura, 4 cm tumor. Discussed with patient. Ultimately he will need to follow up with Dr. Julien Nordmann.   LOS: 2 days    Gaye Pollack 02/03/2017

## 2017-02-04 ENCOUNTER — Inpatient Hospital Stay (HOSPITAL_COMMUNITY): Payer: Medicare Other

## 2017-02-04 LAB — BLOOD GAS, ARTERIAL
Acid-Base Excess: 4.3 mmol/L — ABNORMAL HIGH (ref 0.0–2.0)
BICARBONATE: 29.2 mmol/L — AB (ref 20.0–28.0)
Delivery systems: POSITIVE
Drawn by: 437071
EXPIRATORY PAP: 8
FIO2: 60
Inspiratory PAP: 16
O2 Saturation: 95 %
PATIENT TEMPERATURE: 98.6
PCO2 ART: 51.3 mmHg — AB (ref 32.0–48.0)
PEEP: 8 cmH2O
PH ART: 7.374 (ref 7.350–7.450)
PO2 ART: 79.1 mmHg — AB (ref 83.0–108.0)
RATE: 18 resp/min

## 2017-02-04 LAB — BASIC METABOLIC PANEL
ANION GAP: 10 (ref 5–15)
BUN: 27 mg/dL — ABNORMAL HIGH (ref 6–20)
CHLORIDE: 101 mmol/L (ref 101–111)
CO2: 27 mmol/L (ref 22–32)
CREATININE: 0.97 mg/dL (ref 0.61–1.24)
Calcium: 8.9 mg/dL (ref 8.9–10.3)
GFR calc non Af Amer: >60 mL/min (ref >60)
Glucose, Bld: 137 mg/dL — ABNORMAL HIGH (ref 65–99)
Potassium: 4.3 mmol/L (ref 3.5–5.1)
Sodium: 138 mmol/L (ref 135–145)

## 2017-02-04 LAB — TYPE AND SCREEN
ABO/RH(D): O NEG
ANTIBODY SCREEN: NEGATIVE
UNIT DIVISION: 0
Unit division: 0

## 2017-02-04 LAB — PHOSPHORUS: PHOSPHORUS: 2.4 mg/dL — AB (ref 2.5–4.6)

## 2017-02-04 LAB — CBC
HCT: 34.6 % — ABNORMAL LOW (ref 39.0–52.0)
Hemoglobin: 11.2 g/dL — ABNORMAL LOW (ref 13.0–17.0)
MCH: 29.5 pg (ref 26.0–34.0)
MCHC: 32.4 g/dL (ref 30.0–36.0)
MCV: 91.1 fL (ref 78.0–100.0)
PLATELETS: 208 10*3/uL (ref 150–400)
RBC: 3.8 MIL/uL — ABNORMAL LOW (ref 4.22–5.81)
RDW: 14.7 % (ref 11.5–15.5)
WBC: 12.6 10*3/uL — AB (ref 4.0–10.5)

## 2017-02-04 LAB — MAGNESIUM: Magnesium: 2 mg/dL (ref 1.7–2.4)

## 2017-02-04 MED ORDER — HYDRALAZINE HCL 20 MG/ML IJ SOLN
10.0000 mg | INTRAMUSCULAR | Status: DC | PRN
  Administered 2017-02-04 – 2017-02-05 (×3): 10 mg via INTRAVENOUS
  Filled 2017-02-04 (×3): qty 1

## 2017-02-04 NOTE — Progress Notes (Signed)
TCTS BRIEF SICU PROGRESS NOTE  3 Days Post-Op  S/P Procedure(s) (LRB): FLEXIBLE BRONCHOSCOPY (N/A) THORACOTOMY/LEFT UPPER LOBECTOMY (Left)   Stable day although still w/ high O2 requirement Breathing comfortably w/ O2 10 L/min via HFNC NSR w/ stable BP UOP adequate  Plan: Continue current plan  Rexene Alberts, MD 02/04/2017 7:12 PM

## 2017-02-04 NOTE — Progress Notes (Signed)
PT removed per MD order, will continue to monitor.   Sherrie Mustache

## 2017-02-04 NOTE — Progress Notes (Signed)
PULMONARY / CRITICAL CARE MEDICINE   Name: Paul Valdez MRN: [id number] DOB: [birth year]    ADMISSION DATE:  02/01/2017 CONSULTATION DATE:  02/01/2017  REFERRING MD:  Dr. Roxan Hockey  CHIEF COMPLAINT:  Lethargy post op  Brief:   72 year old male with PMH as below, which is significant for CAD, OSA in process of getting CPAP, and HTN.   Undergoing back pain workup for spine surgery and a 4cm LUL lung mass was discovered - Imaging . He then presented to [facility] 02/05 for elective left upper lobectomy. The procedure was successful without complication and he was extubated post-opertively, however, several hours later he developed lethargy and was minimally responsive. ABG was done and demonstrated severe respiratory acidosis and he was started on BiPAP. ABG did not initially improve with BiPAP so PCCM was consulted.  SUBJECTIVE:  Wore BiPAp all night last pm Pain controlled on fentanyl PCA   VITAL SIGNS: BP (!) 162/101   Pulse 87   Temp 98.5 F (36.9 C) (Axillary)   Resp 15   Ht '5\' 10"'$  (1.778 m)   Wt 95.3 kg (210 lb)   SpO2 95%   BMI 30.13 kg/m   HEMODYNAMICS: CVP:  [6 mmHg-15 mmHg] 15 mmHg  VENTILATOR SETTINGS: FiO2 (%):  [40 %-60 %] 60 %  INTAKE / OUTPUT: I/O last 3 completed shifts: In: 1049.4 [I.V.:1049.4] Out: 8264 [Urine:3010; Chest Tube:160]  PHYSICAL EXAMINATION: General:  Obese male, in chair position, BiPAP just removed, on high flow O2 Neuro: Alert, oriented to self and place, following commands HEENT: Normocephalic, OP dry but clear Cardiovascular:  Tachy, no MRG, NI S1/S2, L chest tube in place Lungs:  Distant breath sounds bilaterally, rhonchi B  Abdomen:  Sift, NT, + BS Musculoskeletal: no deformities Skin: no rash  LABS:  BMET  Recent Labs Lab 02/02/17 1616 02/03/17 0350 02/04/17 0412  NA 137 136 138  K 3.9 4.2 4.3  CL 104 99* 101  CO2 '25 27 27  '$ BUN 22* 19 27*  CREATININE 1.43* 1.14 0.97  GLUCOSE 114* 113* 137*     Electrolytes  Recent Labs Lab 02/02/17 1616 02/03/17 0350 02/04/17 0412  CALCIUM 8.2* 8.4* 8.9  MG  --   --  2.0  PHOS  --   --  2.4*    CBC  Recent Labs Lab 02/02/17 0500 02/03/17 0350 02/04/17 0412  WBC 11.4* 12.4* 12.6*  HGB 11.8* 11.9* 11.2*  HCT 37.0* 37.1* 34.6*  PLT 190 222 208    Coag's  Recent Labs Lab 01/29/17 0846  APTT 33  INR 0.97    Sepsis Markers No results for input(s): LATICACIDVEN, PROCALCITON, O2SATVEN in the last 168 hours.  ABG  Recent Labs Lab 02/03/17 0734 02/03/17 1108 02/04/17 0305  PHART 7.334* 7.265* 7.374  PCO2ART 52.1* 56.6* 51.3*  PO2ART 68.0* 80.0* 79.1*    Liver Enzymes  Recent Labs Lab 01/29/17 0846 02/03/17 0350  AST 16 31  ALT 16* 18  ALKPHOS 65 59  BILITOT 0.6 0.7  ALBUMIN 3.8 3.1*    Cardiac Enzymes No results for input(s): TROPONINI, PROBNP in the last 168 hours.  Glucose  Recent Labs Lab 02/02/17 1125 02/02/17 1506 02/02/17 1919 02/02/17 2337 02/03/17 0339 02/03/17 0728  GLUCAP 91 113* 114* 119* 112* 118*    Imaging Dg Chest Port 1 View  Result Date: 02/04/2017 CLINICAL DATA:  Chest tube, post LEFT upper lobectomy EXAM: PORTABLE CHEST 1 VIEW COMPARISON:  Portable exam 0546 hours compared to 02/03/2015 FINDINGS: Interval  removal of 1 of 2 LEFT thoracostomy tubes. RIGHT jugular central venous catheter with tip projecting over SVC. Rotation to the RIGHT. Enlargement of cardiac silhouette with slight vascular congestion. Decreased lung volumes with bibasilar atelectasis. Small LEFT pleural effusion at apex and base. No pneumothorax. IMPRESSION: Bibasilar atelectasis and small LEFT pleural effusion. No pneumothorax following removal of 1 of 2 LEFT thoracostomy tubes. Electronically Signed   By: Lavonia Dana M.D.   On: 02/04/2017 08:25     STUDIES:  CXR 02/05 > small apical left PTX. Bibasilar atx.  CULTURES: None.  ANTIBIOTICS: vanco x 1 post-op Cefuroxime x1   SIGNIFICANT  EVENTS: 02/05 > elective LUL lobectomy.  LINES/TUBES: None.  DISCUSSION: 71 y.o. M who underwent elective LUL lobectomy 02/05 (Dr. Cyndia Bent).  Later that evening had hypersomnolence > found to have acute hypercarbic respiratory failure therefore started on BiPAP.  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 Small left apical PTX following surgery - chest tubes in place Bibasilar atelectasis. Hx OSA (not on CPAP), former tobacco use. P:   Continue BiPAP qhs and prn during the day. Suspect he will continue to need until he can wean off continuous pain control, get chest tubes out Chest tubes to suction currently, following CXR Continue IS, flutter May benefit from scheduled BD's given possible hx COPD, spirometry 01/01/17 without overt obstruction  CARDIOVASCULAR A:  Hx PVD, HTN, HLD, CAD, atypical chest pain. P:  continue atorvastatin  Hold lisinopril for now  RENAL A:   Acute oliguric renal failure, ? Period of hypoxemia / hypoperfusion > improved P:   Agree with wean dopa to off Follow UOP and BMP  GASTROINTESTINAL A:   GERD. Nutrition. 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 - extension noted throughout visceral pleura  No acute issues P:  Will likely need adjuvant therapy given invasion of pleura, DR Julien Nordmann involved Follow CBC  INFECTIOUS A:   No acute issues. P:   Follow off abx for now  ENDOCRINE A:   No acute issues. P:   No interventions required.  NEUROLOGIC A:   Low back pain - was previously being worked up for surgery (Dr. Ronnald Ramp).  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 Hx depression. P:   On PCA fentanyl, goal wean to off Tramadol prn  Agree avoid toradol Home  sertraline, gabapentin   FAMILY  - Updates: None available.  - Inter-disciplinary family meet or Palliative Care meeting due by:  02/11.    Independent critical care time is 31 minutes.   Baltazar Apo, MD, PhD 02/04/2017, 8:37 AM  [facility] [phone] or if no answer [phone]

## 2017-02-04 NOTE — Progress Notes (Signed)
Patient taken off BiPAP and placed on HFNC at 12 lpm. Patient tolerating well. Sats 95%. RN at bedside. Will continue to monitor.

## 2017-02-04 NOTE — Progress Notes (Signed)
3 Days Post-Op Procedure(s) (LRB): FLEXIBLE BRONCHOSCOPY (N/A) THORACOTOMY/LEFT UPPER LOBECTOMY (Left) Subjective:  Pain well-controlled. Wants some water but has been on bipap overnight. Desats quickly off bipap.  Objective: Vital signs in last 24 hours: Temp:  [97.9 F (36.6 C)-98.7 F (37.1 C)] 98.5 F (36.9 C) (02/08 0723) Pulse Rate:  [75-125] 88 (02/08 0700) Cardiac Rhythm: Normal sinus rhythm (02/08 0700) Resp:  [12-27] 18 (02/08 0700) BP: (95-171)/(58-91) 171/91 (02/08 0700) SpO2:  [88 %-96 %] 94 % (02/08 0700) Arterial Line BP: (158-159)/(61-67) 158/67 (02/07 0900) FiO2 (%):  [40 %-60 %] 60 % (02/08 0700)  Hemodynamic parameters for last 24 hours: CVP:  [6 mmHg-13 mmHg] 7 mmHg  Intake/Output from previous day: 02/07 0701 - 02/08 0700 In: 384.6 [I.V.:384.6] Out: 1835 [Urine:1785; Chest Tube:50] Intake/Output this shift: No intake/output data recorded.  General appearance: alert and cooperative Neurologic: intact Heart: regular rate and rhythm, S1, S2 normal, no murmur, click, rub or gallop Lungs: clear to auscultation bilaterally Extremities: extremities normal, atraumatic, no cyanosis or edema Wound: dressing dry no air leak or drainage from chest tube.  Lab Results:  Recent Labs  02/03/17 0350 02/04/17 0412  WBC 12.4* 12.6*  HGB 11.9* 11.2*  HCT 37.1* 34.6*  PLT 222 208   BMET:  Recent Labs  02/03/17 0350 02/04/17 0412  NA 136 138  K 4.2 4.3  CL 99* 101  CO2 27 27  GLUCOSE 113* 137*  BUN 19 27*  CREATININE 1.14 0.97  CALCIUM 8.4* 8.9    PT/INR: No results for input(s): LABPROT, INR in the last 72 hours. ABG    Component Value Date/Time   PHART 7.374 02/04/2017 0305   HCO3 29.2 (H) 02/04/2017 0305   TCO2 27 02/03/2017 1108   ACIDBASEDEF 2.0 02/03/2017 1108   O2SAT 95.0 02/04/2017 0305   CBG (last 3)   Recent Labs  02/02/17 2337 02/03/17 0339 02/03/17 0728  GLUCAP 119* 112* 118*   CXR: low lung volumes, bibasilar  atelectasis.  Assessment/Plan: S/P Procedure(s) (LRB): FLEXIBLE BRONCHOSCOPY (N/A) THORACOTOMY/LEFT UPPER LOBECTOMY (Left)  He is hemodynamically stable on dopamine 2 mcg for renal perfusion. Since creat back to normal will wean this off.  Acute hypercarbic and hypoxemic respiratory failure: ABG stable this am with PCO2 51 on bipap overnight. His baseline preop room air ABG only had sats of 91% so I think he is going to struggle for a while until he is deep breathing and resolves the atelectasis. Continue bipap as needed.  Acute postop renal failure resolved. Creat back to normal. He has no edema and CVP normal so will not diurese further. Will not give him Toradol.  DC remaining chest tube.   LOS: 3 days    Gaye Pollack 02/04/2017

## 2017-02-05 ENCOUNTER — Inpatient Hospital Stay (HOSPITAL_COMMUNITY): Payer: Medicare Other

## 2017-02-05 LAB — BLOOD GAS, ARTERIAL
ACID-BASE EXCESS: 5.4 mmol/L — AB (ref 0.0–2.0)
BICARBONATE: 29.9 mmol/L — AB (ref 20.0–28.0)
Delivery systems: POSITIVE
Drawn by: 252031
EXPIRATORY PAP: 8
FIO2: 60
Inspiratory PAP: 16
O2 SAT: 98.7 %
PATIENT TEMPERATURE: 98.6
PH ART: 7.409 (ref 7.350–7.450)
PO2 ART: 152 mmHg — AB (ref 83.0–108.0)
pCO2 arterial: 48.1 mmHg — ABNORMAL HIGH (ref 32.0–48.0)

## 2017-02-05 LAB — CBC
HCT: 34.9 % — ABNORMAL LOW (ref 39.0–52.0)
Hemoglobin: 11.1 g/dL — ABNORMAL LOW (ref 13.0–17.0)
MCH: 29.2 pg (ref 26.0–34.0)
MCHC: 31.8 g/dL (ref 30.0–36.0)
MCV: 91.8 fL (ref 78.0–100.0)
PLATELETS: 244 10*3/uL (ref 150–400)
RBC: 3.8 MIL/uL — ABNORMAL LOW (ref 4.22–5.81)
RDW: 14.9 % (ref 11.5–15.5)
WBC: 10.7 10*3/uL — ABNORMAL HIGH (ref 4.0–10.5)

## 2017-02-05 LAB — BASIC METABOLIC PANEL
Anion gap: 12 (ref 5–15)
BUN: 28 mg/dL — AB (ref 6–20)
CO2: 27 mmol/L (ref 22–32)
CREATININE: 0.85 mg/dL (ref 0.61–1.24)
Calcium: 9.2 mg/dL (ref 8.9–10.3)
Chloride: 102 mmol/L (ref 101–111)
GFR calc Af Amer: >60 mL/min (ref >60)
Glucose, Bld: 122 mg/dL — ABNORMAL HIGH (ref 65–99)
POTASSIUM: 3.9 mmol/L (ref 3.5–5.1)
SODIUM: 141 mmol/L (ref 135–145)

## 2017-02-05 LAB — GLUCOSE, CAPILLARY: Glucose-Capillary: 104 mg/dL — ABNORMAL HIGH (ref 65–99)

## 2017-02-05 MED ORDER — LEVALBUTEROL HCL 0.63 MG/3ML IN NEBU
0.6300 mg | INHALATION_SOLUTION | Freq: Four times a day (QID) | RESPIRATORY_TRACT | Status: DC
  Administered 2017-02-05 – 2017-02-06 (×6): 0.63 mg via RESPIRATORY_TRACT
  Filled 2017-02-05 (×6): qty 3

## 2017-02-05 MED ORDER — METOPROLOL TARTRATE 5 MG/5ML IV SOLN
5.0000 mg | Freq: Four times a day (QID) | INTRAVENOUS | Status: DC
  Administered 2017-02-05: 5 mg via INTRAVENOUS

## 2017-02-05 MED ORDER — SODIUM CHLORIDE 0.9 % IV SOLN
30.0000 meq | Freq: Once | INTRAVENOUS | Status: AC
Start: 2017-02-05 — End: 2017-02-05
  Administered 2017-02-05: 30 meq via INTRAVENOUS
  Filled 2017-02-05: qty 15

## 2017-02-05 MED ORDER — METOPROLOL TARTRATE 5 MG/5ML IV SOLN
10.0000 mg | Freq: Four times a day (QID) | INTRAVENOUS | Status: DC
  Administered 2017-02-05 – 2017-02-06 (×4): 10 mg via INTRAVENOUS
  Filled 2017-02-05 (×4): qty 10

## 2017-02-05 MED ORDER — DEXTROSE-NACL 5-0.45 % IV SOLN
INTRAVENOUS | Status: DC
  Administered 2017-02-05: 16:00:00 via INTRAVENOUS
  Administered 2017-02-06: 50 mL/h via INTRAVENOUS

## 2017-02-05 NOTE — Evaluation (Signed)
Physical Therapy Evaluation Patient Details Name: Paul Valdez MRN: [id number] DOB: 24-Oct-1946 Today's Date: 02/05/2017   History of Present Illness  71 yo admitted for LUL lobectomy after mass found on scan in prep for back surgery. Pt with respiratory acidosis and lethargy post op. PMHx: back pain, CAD, oA, HTN  Clinical Impression  Pt pleasant with flat affect, delayed response to questions and commands who demonstrates decreased strength, mobility, gait, function, transfers,  And activity tolerance. Pt will benefit from acute therapy to maximize function, independence and gait to decrease burden of care. Pt encouraged to progress mobility and friend present throughout session to confirm all PLOF.   Pt sats 92-94% on 12L HFNC with gait HR 84-102    Follow Up Recommendations SNF;Supervision/Assistance - 24 hour    Equipment Recommendations  Rolling walker with 5" wheels    Recommendations for Other Services OT consult;Speech consult     Precautions / Restrictions Precautions Precautions: Fall      Mobility  Bed Mobility               General bed mobility comments: in chair on arrival  Transfers Overall transfer level: Needs assistance   Transfers: Sit to/from Stand Sit to Stand: Min guard         General transfer comment: cues for hand placement, sequence, safety  Ambulation/Gait Ambulation/Gait assistance: Min assist;+2 safety/equipment Ambulation Distance (Feet): 50 Feet Assistive device: Rolling walker (2 wheeled) Gait Pattern/deviations: Step-to pattern;Decreased stride length;Shuffle;Trunk flexed;Narrow base of support   Gait velocity interpretation: Below normal speed for age/gender General Gait Details: pt with flexed trunk, bil feet rotated left, trunk rotated right, shuffling gait with use of RW and cues for posture, position in RW, safety and step length  Stairs            Wheelchair Mobility    Modified Rankin (Stroke Patients Only)       Balance Overall balance assessment: Needs assistance   Sitting balance-Leahy Scale: Fair       Standing balance-Leahy Scale: Poor                               Pertinent Vitals/Pain Pain Assessment: 0-10 Pain Score: 3  Pain Location: left chest Pain Descriptors / Indicators: Sore Pain Intervention(s): Limited activity within patient's tolerance;Monitored during session;Repositioned    Home Living Family/patient expects to be discharged to:: Private residence Living Arrangements: Alone Available Help at Discharge: Friend(s);Available PRN/intermittently Type of Home: House Home Access: Level entry     Home Layout: One level Home Equipment: Cane - single point;Grab bars - tub/shower      Prior Function Level of Independence: Independent               Hand Dominance        Extremity/Trunk Assessment   Upper Extremity Assessment Upper Extremity Assessment: Generalized weakness    Lower Extremity Assessment Lower Extremity Assessment: Generalized weakness    Cervical / Trunk Assessment Cervical / Trunk Assessment: Kyphotic  Communication   Communication: No difficulties  Cognition Arousal/Alertness: Awake/alert Behavior During Therapy: Flat affect Overall Cognitive Status: Impaired/Different from baseline Area of Impairment: Memory;Safety/judgement;Attention   Current Attention Level: Sustained Memory: Decreased short-term memory   Safety/Judgement: Decreased awareness of safety;Decreased awareness of deficits     General Comments: pt with increased time to respond to questions and commands with friend present to state mental status decline is not baseline    General  Comments      Exercises     Assessment/Plan    PT Assessment Patient needs continued PT services  PT Problem List Decreased strength;Decreased mobility;Decreased safety awareness;Decreased coordination;Decreased activity tolerance;Decreased  cognition;Cardiopulmonary status limiting activity;Decreased balance;Decreased knowledge of use of DME          PT Treatment Interventions DME instruction;Therapeutic activities;Cognitive remediation;Gait training;Therapeutic exercise;Patient/family education;Balance training;Functional mobility training    PT Goals (Current goals can be found in the Care Plan section)  Acute Rehab PT Goals Patient Stated Goal: return home PT Goal Formulation: With patient Time For Goal Achievement: 02/19/17 Potential to Achieve Goals: Fair    Frequency Min 3X/week   Barriers to discharge Decreased caregiver support friend available to assist but not 24hrs/day    Co-evaluation               End of Session Equipment Utilized During Treatment: Gait belt;Oxygen Activity Tolerance: Patient limited by fatigue Patient left: in chair;with call bell/phone within reach;with family/visitor present;with nursing/sitter in room Nurse Communication: Mobility status;Precautions         Time: 4707-6151 PT Time Calculation (min) (ACUTE ONLY): 23 min   Charges:   PT Evaluation $PT Eval Moderate Complexity: 1 Procedure PT Treatments $Gait Training: 8-22 mins   PT G Codes:        Julea Hutto B Zac Torti 2017/02/16, 1:08 PM  Elwyn Reach, [place]

## 2017-02-05 NOTE — Progress Notes (Signed)
PULMONARY / CRITICAL CARE MEDICINE   Name: NYLE LIMB MRN: [id number] DOB: 1946-01-13    ADMISSION DATE:  02/01/2017 CONSULTATION DATE:  02/01/2017  REFERRING MD:  Dr. Roxan Hockey  CHIEF COMPLAINT:  Lethargy post op  Brief:   71 year old male with PMH as below, which is significant for CAD, OSA in process of getting CPAP, and HTN.   Undergoing back pain workup for spine surgery and a 4cm LUL lung mass was discovered - Imaging . He then presented to [facility] 02/05 for elective left upper lobectomy. The procedure was successful without complication and he was extubated post-opertively, however, several hours later he developed lethargy and was minimally responsive. ABG was done and demonstrated severe respiratory acidosis and he was started on BiPAP. ABG did not initially improve with BiPAP so PCCM was consulted.  SUBJECTIVE:  Did not wear BiPAP overnight, VSS.  No acute events.  Pt asking for something to eat / drink.    VITAL SIGNS: BP 133/63   Pulse 76   Temp 99.4 F (37.4 C) (Axillary)   Resp (!) 21   Ht '5\' 10"'$  (1.778 m)   Wt 210 lb (95.3 kg)   SpO2 93%   BMI 30.13 kg/m   HEMODYNAMICS: CVP:  [7 mmHg-15 mmHg] 8 mmHg  VENTILATOR SETTINGS: FiO2 (%):  [60 %] 60 %  INTAKE / OUTPUT: I/O last 3 completed shifts: In: 947.3 [I.V.:947.3] Out: 2770 [Urine:2770]  PHYSICAL EXAMINATION: General:  Elderly male in NAD, sitting up in bed HEENT: MM pink/moist PSY: normal mood Neuro: AAOx4, speech clear, MAE CV: s1s2 rrr, no m/r/g PULM: even/non-labored, lungs bilaterally with improved aeration GG:YIRS, non-tender, bsx4 active  Extremities: warm/dry, no edema  Skin: no rashes or lesions   LABS:  BMET  Recent Labs Lab 02/03/17 0350 02/04/17 0412 02/05/17 0407  NA 136 138 141  K 4.2 4.3 3.9  CL 99* 101 102  CO2 '27 27 27  '$ BUN 19 27* 28*  CREATININE 1.14 0.97 0.85  GLUCOSE 113* 137* 122*    Electrolytes  Recent Labs Lab 02/03/17 0350 02/04/17 0412  02/05/17 0407  CALCIUM 8.4* 8.9 9.2  MG  --  2.0  --   PHOS  --  2.4*  --     CBC  Recent Labs Lab 02/03/17 0350 02/04/17 0412 02/05/17 0407  WBC 12.4* 12.6* 10.7*  HGB 11.9* 11.2* 11.1*  HCT 37.1* 34.6* 34.9*  PLT 222 208 244    Coag's No results for input(s): APTT, INR in the last 168 hours.  Sepsis Markers No results for input(s): LATICACIDVEN, PROCALCITON, O2SATVEN in the last 168 hours.  ABG  Recent Labs Lab 02/03/17 1108 02/04/17 0305 02/05/17 0336  PHART 7.265* 7.374 7.409  PCO2ART 56.6* 51.3* 48.1*  PO2ART 80.0* 79.1* 152*    Liver Enzymes  Recent Labs Lab 02/03/17 0350  AST 31  ALT 18  ALKPHOS 59  BILITOT 0.7  ALBUMIN 3.1*    Cardiac Enzymes No results for input(s): TROPONINI, PROBNP in the last 168 hours.  Glucose  Recent Labs Lab 02/02/17 1125 02/02/17 1506 02/02/17 1919 02/02/17 2337 02/03/17 0339 02/03/17 0728  GLUCAP 91 113* 114* 119* 112* 118*    Imaging Dg Chest Port 1 View  Result Date: 02/05/2017 CLINICAL DATA:  Status post left upper lobectomy for lung mass. Atelectasis. EXAM: PORTABLE CHEST 1 VIEW COMPARISON:  February 04, 2017 FINDINGS: Chest tube removed on the left. No evident pneumothorax. Central catheter tip is in superior vena cava. There is  postoperative change on the left. The previously noted left pleural effusion is only minimally apparent currently. There is patchy atelectasis in both lung bases. There is a degree of mild underlying interstitial edema. Heart is upper normal in size. The pulmonary vascular is within normal limits. Note that there is a degree of elevation of the right hemidiaphragm, stable. No evident adenopathy. There is degenerative change in each shoulder. IMPRESSION: No pneumothorax. Slight interstitial edema. Minimal left pleural effusion. Patchy bibasilar atelectasis. No airspace consolidation. Stable cardiac silhouette. Electronically Signed   By: Lowella Grip III M.D.   On: 02/05/2017 08:48      STUDIES:  CXR 02/05 > small apical left PTX. Bibasilar atx.  CULTURES: None.  ANTIBIOTICS: vanco x 1 post-op Cefuroxime x1   SIGNIFICANT EVENTS: 02/05 > elective LUL lobectomy.  LINES/TUBES: L IJ TLC 2/5 >>  L Rad Aline 2/5 >> 2/9   DISCUSSION: 71 y.o. M who underwent elective LUL lobectomy 02/05 (Dr. Cyndia Bent).  Later that evening had hypersomnolence > found to have acute hypercarbic respiratory failure therefore started on BiPAP.  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 Small left apical PTX following surgery - chest tubes in place Bibasilar atelectasis. Hx OSA (not on CPAP), former tobacco use. P:   Wean O2 for sats > 90% Pulmonary hygiene - IS, mobilize Xopeniex Q6  Intermittent CXR  CARDIOVASCULAR A:  Hx PVD, HTN, HLD, CAD, atypical chest pain. P:  Continue lopressor IV Q6 until taking PO's Hold home lisinopril, atorvastatin for now D/C Aline   RENAL A:   Acute oliguric renal failure, ? Period of hypoxemia / hypoperfusion > improved P:   Trend BMP / UOP  Replace electrolytes as indicated  GASTROINTESTINAL A:   GERD. Nutrition. P:   SLP eval for swallowing needs Advance diet as tolerated once cleared by SLP   HEMATOLOGIC / ONCOLOGIC A:   SCLC of LUL - s/p elective lobectomy 02/05. - extension noted throughout visceral pleura  No acute issues P:  Dr. Julien Nordmann following, appreciate input  Trend CBC   INFECTIOUS A:   No acute issues. P:   Establish PIV, then D/C central line   NEUROLOGIC A:   Low back pain - was previously being worked up for surgery (Dr. Ronnald Ramp).  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 Hx depression. P:   Minimize sedating medications  Tramadol PRN  Home sertraline, gabapentin as able  FAMILY  - Updates:  No family available on AM rounds, pt updated on plan of care.  Discussed plan with Dr. Cyndia Bent.   - Inter-disciplinary family meet or Palliative Care meeting due by:  02/11.  PCCM will sign off.  Please call back if new needs arise.    Noe Gens, NP-C  Pulmonary & Critical Care Pgr: [phone] or if no answer [phone] 02/05/2017, 10:55 AM   Attending Note:  I have examined patient, reviewed labs, studies and notes. I have discussed the case with B Ollis, and I agree with the data and plans as amended above. 71 year old man with probable obstructive sleep apnea, probable COPD. He is status post a left upper lobe lobectomy on 2/5 for a squamous cell lung cancer. He has been requiring BiPAP and aggressive pulmonary toilet for relative hypoxemia and rapid shallow breathing. He did not wear/require BiPAP last night. I will continue his  efforts at pulmonary toilet. He likely needs to wear BiPAP daily at bedtime if he will participate. Needs outpt Pulm follow up at discharge.   Baltazar Apo, MD, PhD 02/05/2017, 3:01 PM  Pulmonary and Critical Care [phone] or if no answer [phone]

## 2017-02-05 NOTE — Progress Notes (Signed)
      [address]       [place] [address]             [phone]      PM Rounds  POD # 4  Resting comfortably  BP (!) 170/76   Pulse 81   Temp 97.6 F (36.4 C) (Oral)   Resp 19   Ht '5\' 10"'$  (1.778 m)   Wt 210 lb (95.3 kg)   SpO2 92%   BMI 30.13 kg/m    Intake/Output Summary (Last 24 hours) at 02/05/17 1808 Last data filed at 02/05/17 1800  Gross per 24 hour  Intake          1127.34 ml  Output             1680 ml  Net          -552.66 ml   93% sat on 12 L HFNC  Continue present care  Remo Lipps C. Roxan Hockey, MD [facility] [phone]

## 2017-02-05 NOTE — Progress Notes (Signed)
4 Days Post-Op Procedure(s) (LRB): FLEXIBLE BRONCHOSCOPY (N/A) THORACOTOMY/LEFT UPPER LOBECTOMY (Left) Subjective:  Feels a little better. Pain under control  Objective: Vital signs in last 24 hours: Temp:  [97.5 F (36.4 C)-99.5 F (37.5 C)] 97.7 F (36.5 C) (02/09 0400) Pulse Rate:  [82-101] 83 (02/09 0700) Cardiac Rhythm: Normal sinus rhythm (02/09 0600) Resp:  [13-28] 22 (02/09 0700) BP: (144-181)/(75-99) 153/84 (02/09 0700) SpO2:  [88 %-98 %] 93 % (02/09 0740) Arterial Line BP: (151-221)/(60-88) 221/71 (02/09 0600) FiO2 (%):  [60 %] 60 % (02/09 0400)  Hemodynamic parameters for last 24 hours: CVP:  [7 mmHg-15 mmHg] 9 mmHg  Intake/Output from previous day: 02/08 0701 - 02/09 0700 In: 784.1 [I.V.:784.1] Out: 2020 [Urine:2020] Intake/Output this shift: No intake/output data recorded.  General appearance: alert and cooperative Neurologic: intact Heart: regular rate and rhythm, S1, S2 normal, no murmur, click, rub or gallop Lungs: clear to auscultation bilaterally Extremities: extremities normal, atraumatic, no cyanosis or edema Wound: dressing dry  Lab Results:  Recent Labs  02/04/17 0412 02/05/17 0407  WBC 12.6* 10.7*  HGB 11.2* 11.1*  HCT 34.6* 34.9*  PLT 208 244   BMET:  Recent Labs  02/04/17 0412 02/05/17 0407  NA 138 141  K 4.3 3.9  CL 101 102  CO2 27 27  GLUCOSE 137* 122*  BUN 27* 28*  CREATININE 0.97 0.85  CALCIUM 8.9 9.2    PT/INR: No results for input(s): LABPROT, INR in the last 72 hours. ABG    Component Value Date/Time   PHART 7.409 02/05/2017 0336   HCO3 29.9 (H) 02/05/2017 0336   TCO2 27 02/03/2017 1108   ACIDBASEDEF 2.0 02/03/2017 1108   O2SAT 98.7 02/05/2017 0336   CBG (last 3)   Recent Labs  02/02/17 2337 02/03/17 0339 02/03/17 0728  GLUCAP 119* 112* 118*   CLINICAL DATA:  Status post left upper lobectomy for lung mass. Atelectasis.  EXAM: PORTABLE CHEST 1 VIEW  COMPARISON:  February 04, 2017  FINDINGS: Chest tube removed on the left. No evident pneumothorax. Central catheter tip is in superior vena cava. There is postoperative change on the left.  The previously noted left pleural effusion is only minimally apparent currently. There is patchy atelectasis in both lung bases. There is a degree of mild underlying interstitial edema. Heart is upper normal in size. The pulmonary vascular is within normal limits. Note that there is a degree of elevation of the right hemidiaphragm, stable. No evident adenopathy. There is degenerative change in each shoulder.  IMPRESSION: No pneumothorax. Slight interstitial edema. Minimal left pleural effusion. Patchy bibasilar atelectasis. No airspace consolidation. Stable cardiac silhouette.   Electronically Signed   By: Lowella Grip III M.D.   On: 02/05/2017 08:48 Assessment/Plan: S/P Procedure(s) (LRB): FLEXIBLE BRONCHOSCOPY (N/A) THORACOTOMY/LEFT UPPER LOBECTOMY (Left)  Overall I think he looks significantly better today. His CXR shows improved aeration and less atelectasis. ABG is better although done on bipap.  Will DC PCA and use oral pain meds if possible.  Continue IS, flutter valve  Start IV lopressor for HTN and PAC's. Replete K+  OOB to chair. Will consult PT. He has significant lumbar spine disease and was being evaluated for spine surgery when this lung mass was found on preop CXR.  Start clear liquids as tolerated.  I discussed path again yesterday with patient and his friend, [name].   LOS: 4 days    Gaye Pollack 02/05/2017

## 2017-02-05 NOTE — Care Management Note (Addendum)
Case Management Note  Patient Details  Name: IKTAN AIKMAN MRN: [id number] Date of Birth: 14-Aug-1946  Subjective/Objective:       Pt is s/p Thoracotomy and Upper Lobectomy for possible Lung CA           Action/Plan:  PTA  independent from home alone.  CM will continue to follow for discharge needs   Expected Discharge Date:                  Expected Discharge Plan:  Home/Self Care  In-House Referral:     Discharge planning Services  CM Consult  Post Acute Care Choice:    Choice offered to:     DME Arranged:    DME Agency:     HH Arranged:    HH Agency:     Status of Service:  In process, will continue to follow  If discussed at Long Length of Stay Meetings, dates discussed:    Additional Comments: 02/05/2017  SNF recommended as discharge disposition.  CSW consulted  Pt alert and oriented.  Pt confirms he is home alone but stated he has a friend that will come and help him at discharge.  Pt stated he was independent at home, occasionally uses cane for assistance with mobility.  CM will continue to follow for discharge needs. Maryclare Labrador, RN 02/05/2017, 9:14 AM

## 2017-02-06 ENCOUNTER — Inpatient Hospital Stay (HOSPITAL_COMMUNITY): Payer: Medicare Other

## 2017-02-06 DIAGNOSIS — R918 Other nonspecific abnormal finding of lung field: Secondary | ICD-10-CM

## 2017-02-06 DIAGNOSIS — J80 Acute respiratory distress syndrome: Secondary | ICD-10-CM

## 2017-02-06 MED ORDER — SODIUM CHLORIDE 0.9% FLUSH
3.0000 mL | Freq: Two times a day (BID) | INTRAVENOUS | Status: DC
  Administered 2017-02-06 – 2017-02-12 (×10): 3 mL

## 2017-02-06 MED ORDER — LEVALBUTEROL HCL 0.63 MG/3ML IN NEBU
0.6300 mg | INHALATION_SOLUTION | Freq: Three times a day (TID) | RESPIRATORY_TRACT | Status: DC
  Administered 2017-02-07 – 2017-02-09 (×6): 0.63 mg via RESPIRATORY_TRACT
  Filled 2017-02-06 (×6): qty 3

## 2017-02-06 MED ORDER — METOPROLOL TARTRATE 25 MG PO TABS
25.0000 mg | ORAL_TABLET | Freq: Two times a day (BID) | ORAL | Status: DC
  Administered 2017-02-06 – 2017-02-08 (×5): 25 mg via ORAL
  Filled 2017-02-06 (×5): qty 1

## 2017-02-06 MED ORDER — SODIUM CHLORIDE 0.9% FLUSH: 3.0000 mL | INTRAVENOUS | Status: DC | PRN

## 2017-02-06 MED ORDER — METOPROLOL TARTRATE 5 MG/5ML IV SOLN: 2.5000 mg | INTRAVENOUS | Status: DC | PRN

## 2017-02-06 NOTE — Progress Notes (Signed)
      [address]       [place] [address]             [phone]      In good spirits  BP (!) 150/75 (BP Location: Right Arm)   Pulse 65   Temp 97.5 F (36.4 C) (Oral)   Resp 12   Ht '5\' 10"'$  (1.778 m)   Wt 198 lb 3.1 oz (89.9 kg)   SpO2 96%   BMI 28.44 kg/m    Intake/Output Summary (Last 24 hours) at 02/06/17 1749 Last data filed at 02/06/17 1600  Gross per 24 hour  Intake             1620 ml  Output             1385 ml  Net              235 ml   Passed MBS  Bayley [place]ar[place] C. Roxan Hockey, MD [facility] [phone]

## 2017-02-06 NOTE — Progress Notes (Signed)
Rt IJ DLC removed per MD order. Cath removed intact and pressure held at site until hemostasis noted. Petroleum gauze and 4x4 gauze applied. Pt tolerated procedure without incidence.

## 2017-02-06 NOTE — Evaluation (Signed)
Clinical/Bedside Swallow Evaluation Patient Details  Name: Paul Valdez MRN: [id number] Date of Birth: 09-30-1946  Today's Date: 02/06/2017 Time: SLP Start Time (ACUTE ONLY): 1031 SLP Stop Time (ACUTE ONLY): 1046 SLP Time Calculation (min) (ACUTE ONLY): 15 min  Past Medical History:  Past Medical History:  Diagnosis Date  . Arthritis   . Asthma   . Atypical chest pain    a. Normal nuc 2014.  . Carotid artery disease ([place])    a. Carotid duplex 2015: 26-37% RICA, 8-58% LICA.   Marland Kitchen Cataract   . Coronary artery calcification seen on CT scan   . Depression   . Detached retina   . Former consumption of alcohol   . Former tobacco use   . GERD (gastroesophageal reflux disease)    "I take heart burn medicine"  . Glaucoma   . Hyperlipemia   . Hypertension   . Low back pain 12/29/2016  . Mass of upper lobe of left lung 12/29/2016  . PVD (peripheral vascular disease) ([place])    a. Mild plaque of iliacs in 2013 on duplex; PET 2018:  PET also corroborated coronary, aortic arch, and branch vessel atherosclerotic vascular disease as well as aortoiliac atherosclerotic vascular disease.  . Sleep apnea    has not gotten CPAP yet   Past Surgical History:  Past Surgical History:  Procedure Laterality Date  . APPENDECTOMY    . COLONOSCOPY    . EYE SURGERY    . FLEXIBLE BRONCHOSCOPY N/A 02/01/2017   Procedure: FLEXIBLE BRONCHOSCOPY;  Surgeon: Gaye Pollack, MD;  Location: [facility] OR;  Service: Thoracic;  Laterality: N/A;  . GANGLION CYST EXCISION     left hand  . HERNIA REPAIR     double hernia repair  . THORACOTOMY/LOBECTOMY Left 02/01/2017   Procedure: THORACOTOMY/LEFT UPPER LOBECTOMY;  Surgeon: Gaye Pollack, MD;  Location: [facility] OR;  Service: Thoracic;  Laterality: Left;  . TUMOR REMOVAL     non cancer tumor from neck   HPI:  71 year old male with PMH significant for CAD, OSA in process of getting CPAP, and HTN. Undergoing back pain workup for spine surgery and a 4cm LUL lung mass was discovered.  He then presented to [facility] 02/05 for elective left upper lobectomy. The procedure was successful without complication and he was extubated post-opertively, however, several hours later he developed lethargy and was minimally responsive. ABG was done and demonstrated severe respiratory acidosis and he was started on BiPAP. Currently on HFNC. CXR today showed Worsening bilateral airspace disease, diffuse on the left and in theright lung base.   Assessment / Plan / Recommendation Clinical Impression  Patient presents with moderate risk for aspiration, with clinical signs of aspiration observed during bedside swallowing evaluation. Patient is noted with change in vocal quality, immediate coughing and throat clearing following trials of thin liquid via straw and single cup sips, and wet vocal quality, delayed cough following trials of pureed solids. The above signs are indicative of decreased airway protection. Given patient's recent intubation, decreased respiratory status, history of GERD and hiatal hernia, recommend MBS prior to initiating PO diet. Recommend patient remain NPO pending instrumental examination, with ice chips, cup sips of water, and sips of water with necessary medications following oral care. MBS to be performed 2/10 or 2/11.     Aspiration Risk  Moderate aspiration risk    Diet Recommendation NPO;Ice chips PRN after oral care;Free water protocol after oral care (cup sips of water after oral care)  Liquid Administration via: Cup;No straw Medication Administration: Whole meds with liquid Supervision: Intermittent supervision to cue for compensatory strategies Compensations: Slow rate;Small sips/bites Postural Changes: Seated upright at 90 degrees    Other  Recommendations Oral Care Recommendations: Oral care QID   Follow up Recommendations Other (comment) (TBD)      Frequency and Duration            Prognosis Prognosis for Safe Diet Advancement: Good      Swallow  Study   General Date of Onset: 02/01/17 HPI: 71 year old male with PMH significant for CAD, OSA in process of getting CPAP, and HTN. Undergoing back pain workup for spine surgery and a 4cm LUL lung mass was discovered. He then presented to [facility] 02/05 for elective left upper lobectomy. The procedure was successful without complication and he was extubated post-opertively, however, several hours later he developed lethargy and was minimally responsive. ABG was done and demonstrated severe respiratory acidosis and he was started on BiPAP. Currently on HFNC. CXR today showed Worsening bilateral airspace disease, diffuse on the left and in theright lung base. Type of Study: Bedside Swallow Evaluation Previous Swallow Assessment: none per chart Diet Prior to this Study: NPO (ice chips) Temperature Spikes Noted: No Respiratory Status: Nasal cannula (HFNC) History of Recent Intubation: Yes Length of Intubations (days): 1 days Date extubated: 02/01/17 Behavior/Cognition: Alert;Cooperative;Impulsive Oral Cavity Assessment: Within Functional Limits Oral Care Completed by SLP: Recent completion by staff Oral Cavity - Dentition: Adequate natural dentition Vision: Functional for self-feeding Self-Feeding Abilities: Able to feed self Patient Positioning: Upright in chair Baseline Vocal Quality: Hoarse Volitional Cough: Congested Volitional Swallow: Able to elicit    Oral/Motor/Sensory Function Overall Oral Motor/Sensory Function: Other (comment) (noted edema with palpation of throat/larynx)   Ice Chips Ice chips: Within functional limits   Thin Liquid Thin Liquid: Impaired Presentation: Cup;Straw Pharyngeal  Phase Impairments: Suspected delayed Swallow;Multiple swallows;Cough - Immediate;Wet Vocal Quality;Throat Clearing - Immediate    Nectar Thick Nectar Thick Liquid: Not tested   Honey Thick Honey Thick Liquid: Not tested   Puree Puree: Impaired Presentation: Spoon;Self Fed Pharyngeal Phase  Impairments: Cough - Delayed;Wet Vocal Quality   Solid   GO  Deneise Lever, [place] CF-SLP Speech-Language Pathologist [phone] Solid: Not tested        Aliene Altes 02/06/2017,11:09 AM

## 2017-02-06 NOTE — Plan of Care (Signed)
Problem: Cardiac: Goal: Hemodynamic stability will improve Outcome: Progressing Pt maintaining NSR with occasional PACs.

## 2017-02-06 NOTE — Progress Notes (Signed)
5 Days Post-Op Procedure(s) (LRB): FLEXIBLE BRONCHOSCOPY (N/A) THORACOTOMY/LEFT UPPER LOBECTOMY (Left) Subjective: Pain well controlled Asking for a soda  Objective: Vital signs in last 24 hours: Temp:  [97.6 F (36.4 C)-99.4 F (37.4 C)] 98.1 F (36.7 C) (02/10 0700) Pulse Rate:  [59-88] 65 (02/10 0700) Cardiac Rhythm: Normal sinus rhythm (02/09 2000) Resp:  [15-29] 16 (02/10 0700) BP: (110-176)/(61-87) 174/82 (02/10 0700) SpO2:  [91 %-96 %] 96 % (02/10 0700) Weight:  [198 lb 3.1 oz (89.9 kg)] 198 lb 3.1 oz (89.9 kg) (02/10 0630)  Hemodynamic parameters for last 24 hours: CVP:  [8 mmHg] 8 mmHg  Intake/Output from previous day: 02/09 0701 - 02/10 0700 In: 1607.3 [P.O.:480; I.V.:862.3; IV Piggyback:265] Out: 1600 [Urine:1600] Intake/Output this shift: No intake/output data recorded.  General appearance: alert, cooperative and no distress Neurologic: intact Heart: regular rate and rhythm Lungs: diminished breath sounds bibasilar Abdomen: normal findings: soft, non-tender  Lab Results:  Recent Labs  02/04/17 0412 02/05/17 0407  WBC 12.6* 10.7*  HGB 11.2* 11.1*  HCT 34.6* 34.9*  PLT 208 244   BMET:  Recent Labs  02/04/17 0412 02/05/17 0407  NA 138 141  K 4.3 3.9  CL 101 102  CO2 27 27  GLUCOSE 137* 122*  BUN 27* 28*  CREATININE 0.97 0.85  CALCIUM 8.9 9.2    PT/INR: No results for input(s): LABPROT, INR in the last 72 hours. ABG    Component Value Date/Time   PHART 7.409 02/05/2017 0336   HCO3 29.9 (H) 02/05/2017 0336   TCO2 27 02/03/2017 1108   ACIDBASEDEF 2.0 02/03/2017 1108   O2SAT 98.7 02/05/2017 0336   CBG (last 3)   Recent Labs  02/05/17 1233  GLUCAP 104*    Assessment/Plan: S/P Procedure(s) (LRB): FLEXIBLE BRONCHOSCOPY (N/A) THORACOTOMY/LEFT UPPER LOBECTOMY (Left) -CV- stable  RESP- still marginal on high flow [place], CXR shows increased atelectasis- refused BIPAP last night  RENAL- creatinine stable  Swallow eval not completed  yet  Deconditioning- mobilize, PT   LOS: 5 days    Melrose Nakayama 02/06/2017

## 2017-02-06 NOTE — Progress Notes (Signed)
PULMONARY / CRITICAL CARE MEDICINE   Name: Paul Valdez MRN: [id number] DOB: [birth year]    ADMISSION DATE:  02/01/2017 CONSULTATION DATE:  02/01/2017  REFERRING MD:  Dr. Roxan Hockey  CHIEF COMPLAINT:  Lethargy post op  Brief:   71 year old male with PMH as below, which is significant for CAD, OSA in process of getting CPAP, and HTN.   Undergoing back pain workup for spine surgery and a 4cm LUL lung mass was discovered - Imaging . He then presented to [facility] 02/05 for elective left upper lobectomy. The procedure was successful without complication and he was extubated post-opertively, however, several hours later he developed lethargy and was minimally responsive. ABG was done and demonstrated severe respiratory acidosis and he was started on BiPAP. ABG did not initially improve with BiPAP so PCCM was consulted.  SUBJECTIVE:  Refused to wear BiPAP, no events overnight  VITAL SIGNS: BP (!) 174/82   Pulse 65   Temp 98.1 F (36.7 C)   Resp 16   Ht '5\' 10"'$  (1.778 m)   Wt 89.9 kg (198 lb 3.1 oz)   SpO2 96%   BMI 28.44 kg/m   HEMODYNAMICS: CVP:  [8 mmHg] 8 mmHg  VENTILATOR SETTINGS:    INTAKE / OUTPUT: I/O last 3 completed shifts: In: 2207.3 [P.O.:480; I.V.:1462.3; IV Piggyback:265] Out: 2575 [Urine:2575]  PHYSICAL EXAMINATION: General:  Elderly male in NAD, up in chair HEENT: [place]/AT, PERRL, EOM-I and MMM PSY: Pleasant Neuro: Alert and oriented, moving all ext to command CV: RRR, Nl S1/S2, -M/R/G. PULM: Decreased BS on the left, clear on the right TK:ZSWF, NT, ND and +BS Extremities: -edema and -tenderness Skin: no rashes or lesions  LABS:  BMET  Recent Labs Lab 02/03/17 0350 02/04/17 0412 02/05/17 0407  NA 136 138 141  K 4.2 4.3 3.9  CL 99* 101 102  CO2 '27 27 27  '$ BUN 19 27* 28*  CREATININE 1.14 0.97 0.85  GLUCOSE 113* 137* 122*   Electrolytes  Recent Labs Lab 02/03/17 0350 02/04/17 0412 02/05/17 0407  CALCIUM 8.4* 8.9 9.2  MG  --  2.0  --    PHOS  --  2.4*  --     CBC  Recent Labs Lab 02/03/17 0350 02/04/17 0412 02/05/17 0407  WBC 12.4* 12.6* 10.7*  HGB 11.9* 11.2* 11.1*  HCT 37.1* 34.6* 34.9*  PLT 222 208 244    Coag's No results for input(s): APTT, INR in the last 168 hours.  Sepsis Markers No results for input(s): LATICACIDVEN, PROCALCITON, O2SATVEN in the last 168 hours.  ABG  Recent Labs Lab 02/03/17 1108 02/04/17 0305 02/05/17 0336  PHART 7.265* 7.374 7.409  PCO2ART 56.6* 51.3* 48.1*  PO2ART 80.0* 79.1* 152*    Liver Enzymes  Recent Labs Lab 02/03/17 0350  AST 31  ALT 18  ALKPHOS 59  BILITOT 0.7  ALBUMIN 3.1*    Cardiac Enzymes No results for input(s): TROPONINI, PROBNP in the last 168 hours.  Glucose  Recent Labs Lab 02/02/17 1506 02/02/17 1919 02/02/17 2337 02/03/17 0339 02/03/17 0728 02/05/17 1233  GLUCAP 113* 114* 119* 112* 118* 104*    Imaging Dg Chest Port 1 View  Result Date: 02/06/2017 CLINICAL DATA:  Post lobectomy of lung EXAM: PORTABLE CHEST 1 VIEW COMPARISON:  02/05/2017 FINDINGS: Interval removal of right internal jugular central line. Very low lung volumes. Worsening diffuse airspace disease throughout the left lung and right basilar airspace disease. No visible pneumothorax. IMPRESSION: Worsening bilateral airspace disease, diffuse on the left  and in the right lung base. Electronically Signed   By: Rolm Baptise M.D.   On: 02/06/2017 08:07     STUDIES:  CXR 02/05 > small apical left PTX. Bibasilar atx.  CULTURES: None.  ANTIBIOTICS: vanco x 1 post-op Cefuroxime x1   SIGNIFICANT EVENTS: 02/05 > elective LUL lobectomy.  LINES/TUBES: L IJ TLC 2/5 >>  L Rad Aline 2/5 >> 2/9   I reviewed CXR myself, left sided opacification noted, ETT out.  DISCUSSION: 71 y.o. M who underwent elective LUL lobectomy 02/05 (Dr. Cyndia Bent).  Later that evening had hypersomnolence > found to have acute hypercarbic respiratory failure therefore started on  BiPAP.  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 Small left apical PTX following surgery - chest tubes in place Bibasilar atelectasis. Hx OSA (not on CPAP), former tobacco use. P:   Wean O2 for sats > 88% Ambulate PT IS Flutter valve Xopeniex Q6  Intermittent CXR as needed  CARDIOVASCULAR A:  Hx PVD, HTN, HLD, CAD, atypical chest pain. P:  IV lopressor until able to take PO then switch to PO Hold home lisinopril, atorvastatin for now Tele monitoring  RENAL A:   Acute oliguric renal failure, ? Period of hypoxemia / hypoperfusion > improved P:   Replace electrolytes as indicated BMET in AM NS 50 ml/hr until able to take PO  GASTROINTESTINAL A:   GERD. Nutrition. P:   SLP eval for swallowing needs Advance diet as tolerated once cleared by SLP   HEMATOLOGIC / ONCOLOGIC A:   SCLC of LUL - s/p elective lobectomy 02/05. - extension noted throughout visceral pleura  No acute issues P:  Dr. Julien Nordmann following, appreciate input  Trend CBC   INFECTIOUS A:   No acute issues. P:   Establish PIV, D/Ced central line Hold off abx for now Cultures noted  NEUROLOGIC A:   Low back pain - was previously being worked up for surgery (Dr. Ronnald Ramp).  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 Hx depression. P:   Minimize sedating medications  Tramadol PRN  Home sertraline, gabapentin as able  FAMILY  - Updates: Patient updated bedside.  - Inter-disciplinary family meet or Palliative Care meeting due by:  02/11.  Discussed with Dr. Roxan Hockey.  Rush Farmer, M.D. [facility] Pulmonary/Critical Care Medicine. Pager: [phone]. After hours pager: [phone].

## 2017-02-06 NOTE — Progress Notes (Signed)
Modified Barium Swallow Progress Note  Patient Details  Name: ADONNIS SALCEDA MRN: [id number] Date of Birth: 1946/03/17  Today's Date: 02/06/2017  Modified Barium Swallow completed.  Full report located under Chart Review in the Imaging Section.  Brief recommendations include the following:  Clinical Impression  Patient presents with oropharyngeal swallow which appears grossly within functional limits. No aspiration observed. Noted intermittent pentration due to delayed swallow initiation at pyriform sinuses with thin liquids (transient) and with mixed consistency (thin liquid with barium tablet). Oral phase characterized by adequate mastication and anterior to posterior transit. Pharyngeal phase with adequate tongue base retraction, hyolaryngeal excursion and pharyngeal constriction. Epiglottic movement complete. No abnormal residual remains in the oral cavity or pharynx after the swallow. Recommend regular diet with thin liquids, medications whole in puree. SLP will f/u for tolerance, education regarding general aspiration precautions due to patient's noted impulsivity with liquids.   Swallow Evaluation Recommendations       SLP Diet Recommendations: Regular solids;Thin liquid   Liquid Administration via: Cup;Straw   Medication Administration: Whole meds with puree   Supervision: Intermittent supervision to cue for compensatory strategies   Compensations: Slow rate;Small sips/bites   Postural Changes: Seated upright at 90 degrees   Oral Care Recommendations: Oral care BID      Deneise Lever, [place] CF-SLP Speech-Language Pathologist [phone]  Aliene Altes 02/06/2017,3:42 PM

## 2017-02-07 ENCOUNTER — Inpatient Hospital Stay (HOSPITAL_COMMUNITY): Payer: Medicare Other

## 2017-02-07 LAB — CBC
HCT: 34.5 % — ABNORMAL LOW (ref 39.0–52.0)
Hemoglobin: 10.8 g/dL — ABNORMAL LOW (ref 13.0–17.0)
MCH: 29.1 pg (ref 26.0–34.0)
MCHC: 31.3 g/dL (ref 30.0–36.0)
MCV: 93 fL (ref 78.0–100.0)
PLATELETS: 216 10*3/uL (ref 150–400)
RBC: 3.71 MIL/uL — ABNORMAL LOW (ref 4.22–5.81)
RDW: 14.8 % (ref 11.5–15.5)
WBC: 8 10*3/uL (ref 4.0–10.5)

## 2017-02-07 LAB — MAGNESIUM: Magnesium: 2.2 mg/dL (ref 1.7–2.4)

## 2017-02-07 LAB — PHOSPHORUS: Phosphorus: 5.1 mg/dL — ABNORMAL HIGH (ref 2.5–4.6)

## 2017-02-07 LAB — BASIC METABOLIC PANEL
ANION GAP: 10 (ref 5–15)
BUN: 33 mg/dL — AB (ref 6–20)
CHLORIDE: 102 mmol/L (ref 101–111)
CO2: 28 mmol/L (ref 22–32)
CREATININE: 0.84 mg/dL (ref 0.61–1.24)
Calcium: 9.1 mg/dL (ref 8.9–10.3)
GFR calc non Af Amer: >60 mL/min (ref >60)
GLUCOSE: 103 mg/dL — AB (ref 65–99)
POTASSIUM: 3.8 mmol/L (ref 3.5–5.1)
SODIUM: 140 mmol/L (ref 135–145)

## 2017-02-07 MED ORDER — FUROSEMIDE 10 MG/ML IJ SOLN
40.0000 mg | Freq: Once | INTRAMUSCULAR | Status: AC
  Administered 2017-02-07: 40 mg via INTRAVENOUS
  Filled 2017-02-07: qty 4

## 2017-02-07 NOTE — Progress Notes (Addendum)
PULMONARY / CRITICAL CARE MEDICINE   Name: SOTA HETZ MRN: [id number] DOB: 05/02/1946    ADMISSION DATE:  02/01/2017 CONSULTATION DATE:  02/01/2017  REFERRING MD:  Dr. Roxan Hockey  CHIEF COMPLAINT:  Lethargy post op  Brief:   71 year old male with PMH as below, which is significant for CAD, OSA in process of getting CPAP, and HTN.   Undergoing back pain workup for spine surgery and a 4cm LUL lung mass was discovered - Imaging . He then presented to [facility] 02/05 for elective left upper lobectomy. The procedure was successful without complication and he was extubated post-opertively, however, several hours later he developed lethargy and was minimally responsive. ABG was done and demonstrated severe respiratory acidosis and he was started on BiPAP. ABG did not initially improve with BiPAP so PCCM was consulted.  SUBJECTIVE:  Used BiPAP overnight and feels that he slept much better with it.  VITAL SIGNS: BP (!) 135/112   Pulse 61   Temp 97.5 F (36.4 C) (Oral)   Resp 14   Ht _0  (1.778 m)   Wt 91.8 kg (202 lb 6.1 oz)   SpO2 98%   BMI 29.04 kg/m   HEMODYNAMICS:    VENTILATOR SETTINGS: FiO2 (%):  [50 %] 50 %  INTAKE / OUTPUT: I/O last 3 completed shifts: In: 2322 [P.O.:1120; I.V.:1202] Out: 1510 [Urine:1510]  PHYSICAL EXAMINATION: General:  Elderly male in NAD, up in chair, eating breakfast HEENT: [place]/AT, PERRL, EOM-I and MMM PSY: Pleasant Neuro: Alert and oriented x4, moving all ext to command CV: RRR, Nl S1/S2, -M/R/G. PULM: Lungs clearing compared to yesterday but still decreased BS on the left GI: Soft, NT, ND and +BS Extremities: -edema and -tenderness Skin: no rashes or lesions  LABS:  BMET  Recent Labs Lab 02/04/17 0412 02/05/17 0407 02/07/17 0242  NA 138 141 140  K 4.3 3.9 3.8  CL 101 102 102  CO2 _1 BUN 27* 28* 33*  CREATININE 0.97 0.85 0.84  GLUCOSE 137* 122* 103*   Electrolytes  Recent Labs Lab 02/04/17 0412 02/05/17 0407  02/07/17 0242  CALCIUM 8.9 9.2 9.1  MG 2.0  --  2.2  PHOS 2.4*  --  5.1*   CBC  Recent Labs Lab 02/04/17 0412 02/05/17 0407 02/07/17 0242  WBC 12.6* 10.7* 8.0  HGB 11.2* 11.1* 10.8*  HCT 34.6* 34.9* 34.5*  PLT 208 244 216   Coag's No results for input(s): APTT, INR in the last 168 hours.  Sepsis Markers No results for input(s): LATICACIDVEN, PROCALCITON, O2SATVEN in the last 168 hours.  ABG  Recent Labs Lab 02/03/17 1108 02/04/17 0305 02/05/17 0336  PHART 7.265* 7.374 7.409  PCO2ART 56.6* 51.3* 48.1*  PO2ART 80.0* 79.1* 152*   Liver Enzymes  Recent Labs Lab 02/03/17 0350  AST 31  ALT 18  ALKPHOS 59  BILITOT 0.7  ALBUMIN 3.1*   Cardiac Enzymes No results for input(s): TROPONINI, PROBNP in the last 168 hours.  Glucose  Recent Labs Lab 02/02/17 1506 02/02/17 1919 02/02/17 2337 02/03/17 0339 02/03/17 0728 02/05/17 1233  GLUCAP 113* 114* 119* 112* 118* 104*   Imaging Dg Chest Port 1 View  Result Date: 02/07/2017 CLINICAL DATA:  Atelectasis EXAM: PORTABLE CHEST 1 VIEW COMPARISON:  02/06/2017 FINDINGS: Diffuse left lung airspace disease and right basilar atelectasis or infiltrate again noted, not significantly changed. Small bilateral effusions suspected. Mild cardiomegaly. IMPRESSION: No significant change since prior study. Electronically Signed   By: Rolm Baptise  M.D.   On: 02/07/2017 07:05   Dg Swallowing Func-speech Pathology  Result Date: 02/06/2017 Objective Swallowing Evaluation: Type of Study: Bedside Swallow Evaluation Patient Details Name: GEROGE GILLIAM MRN: [id number] Date of Birth: 04/19/1946 Today's Date: 02/06/2017 Time: SLP Start Time (ACUTE ONLY): 1400-SLP Stop Time (ACUTE ONLY): 1430 SLP Time Calculation (min) (ACUTE ONLY): 30 min Past Medical History: Past Medical History: Diagnosis Date . Arthritis  . Asthma  . Atypical chest pain   a. Normal nuc 2014. . Carotid artery disease ([place])   a. Carotid duplex 2015: 09-62% RICA, 8-36% LICA.  Mar[place] Kitchen  Cataract  . Coronary artery calcification seen on CT scan  . Depression  . Detached retina  . Former consumption of alcohol  . Former tobacco use  . GERD (gastroesophageal reflux disease)   "I take heart burn medicine" . Glaucoma  . Hyperlipemia  . Hypertension  . Low back pain 12/29/2016 . Mass of upper lobe of left lung 12/29/2016 . PVD (peripheral vascular disease) ([place])   a. Mild plaque of iliacs in 2013 on duplex; PET 2018:  PET also corroborated coronary, aortic arch, and branch vessel atherosclerotic vascular disease as well as aortoiliac atherosclerotic vascular disease. . Sleep apnea   has not gotten CPAP yet Past Surgical History: Past Surgical History: Procedure Laterality Date . APPENDECTOMY   . COLONOSCOPY   . EYE SURGERY   . FLEXIBLE BRONCHOSCOPY N/A 02/01/2017  Procedure: FLEXIBLE BRONCHOSCOPY;  Surgeon: Gaye Pollack, MD;  Location: [facility] OR;  Service: Thoracic;  Laterality: N/A; . GANGLION CYST EXCISION    left hand . HERNIA REPAIR    double hernia repair . THORACOTOMY/LOBECTOMY Left 02/01/2017  Procedure: THORACOTOMY/LEFT UPPER LOBECTOMY;  Surgeon: Gaye Pollack, MD;  Location: [facility] OR;  Service: Thoracic;  Laterality: Left; . 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 Undergoing back pain workup for spine surgery and a 4cm LUL lung mass was discovered. He then presented to [facility] 02/05 for elective left upper lobectomy. The procedure was successful without complication and he was extubated post-opertively, however, several hours later he developed lethargy and was minimally responsive. ABG was done and demonstrated severe respiratory acidosis and he was started on BiPAP. Currently on HFNC. CXR today showed Worsening bilateral airspace disease, diffuse on the left and in theright lung base. Subjective: upright in chair, RN present Assessment / Plan / Recommendation CHL IP CLINICAL IMPRESSIONS 02/06/2017 Therapy  Diagnosis WFL Clinical Impression Patient presents with oropharyngeal swallow which appears grossly within functional limits. No aspiration observed. Noted intermittent pentration due to delayed swallow initiation at pyriform sinuses with thin liquids (transient) and with mixed consistency (thin liquid with barium tablet). Oral phase characterized by adequate mastication and anterior to posterior transit. Pharyngeal phase with adequate tongue base retraction, hyolaryngeal excursion and pharyngeal constriction. Epiglottic movement complete. No abnormal residual remains in the oral cavity or pharynx after the swallow. Recommend regular diet with thin liquids, medications whole in puree. SLP will f/u for tolerance, education regarding general aspiration precautions due to patient's noted impulsivity with liquids. Impact on safety and function Mild aspiration risk   CHL IP TREATMENT RECOMMENDATION 02/06/2017 Treatment Recommendations Therapy as outlined in treatment plan below   Prognosis 02/06/2017 Prognosis for Safe Diet Advancement Good Barriers to Reach Goals -- Barriers/Prognosis Comment -- CHL IP DIET RECOMMENDATION 02/06/2017 SLP Diet Recommendations Regular solids;Thin liquid Liquid Administration via Cup;Straw Medication  Administration Whole meds with puree Compensations Slow rate;Small sips/bites Postural Changes Seated upright at 90 degrees   CHL IP OTHER RECOMMENDATIONS 02/06/2017 Recommended Consults -- Oral Care Recommendations Oral care BID Other Recommendations --   CHL IP FOLLOW UP RECOMMENDATIONS 02/06/2017 Follow up Recommendations Other (comment)   CHL IP FREQUENCY AND DURATION 02/06/2017 Speech Therapy Frequency (ACUTE ONLY) min 1 x/week Treatment Duration 1 week      CHL IP ORAL PHASE 02/06/2017 Oral Phase WFL Oral - Pudding Teaspoon -- Oral - Pudding Cup -- Oral - Honey Teaspoon -- Oral - Honey Cup -- Oral - Nectar Teaspoon -- Oral - Nectar Cup -- Oral - Nectar Straw -- Oral - Thin Teaspoon -- Oral -  Thin Cup -- Oral - Thin Straw -- Oral - Puree -- Oral - Mech Soft -- Oral - Regular -- Oral - Multi-Consistency -- Oral - Pill -- Oral Phase - Comment --  CHL IP PHARYNGEAL PHASE 02/06/2017 Pharyngeal Phase Impaired Pharyngeal- Pudding Teaspoon -- Pharyngeal -- Pharyngeal- Pudding Cup -- Pharyngeal -- Pharyngeal- Honey Teaspoon -- Pharyngeal -- Pharyngeal- Honey Cup -- Pharyngeal -- Pharyngeal- Nectar Teaspoon -- Pharyngeal -- Pharyngeal- Nectar Cup -- Pharyngeal -- Pharyngeal- Nectar Straw -- Pharyngeal -- Pharyngeal- Thin Teaspoon -- Pharyngeal -- Pharyngeal- Thin Cup -- Pharyngeal -- Pharyngeal- Thin Straw -- Pharyngeal -- Pharyngeal- Puree -- Pharyngeal -- Pharyngeal- Mechanical Soft -- Pharyngeal -- Pharyngeal- Regular -- Pharyngeal -- Pharyngeal- Multi-consistency -- Pharyngeal -- Pharyngeal- Pill -- Pharyngeal -- Pharyngeal Comment --  CHL IP CERVICAL ESOPHAGEAL PHASE 02/06/2017 Cervical Esophageal Phase WFL Pudding Teaspoon -- Pudding Cup -- Honey Teaspoon -- Honey Cup -- Nectar Teaspoon -- Nectar Cup -- Nectar Straw -- Thin Teaspoon -- Thin Cup -- Thin Straw -- Puree -- Mechanical Soft -- Regular -- Multi-consistency -- Pill -- Cervical Esophageal Comment -- Deneise Lever, MS CF-SLP Speech-Language Pathologist [phone] No flowsheet data found. Aliene Altes 02/06/2017, 3:45 PM                STUDIES:  CXR 02/05 > small apical left PTX. Bibasilar atx.  CULTURES: None.  ANTIBIOTICS: vanco x 1 post-op Cefuroxime x1   SIGNIFICANT EVENTS: 02/05 > elective LUL lobectomy.  LINES/TUBES: L IJ TLC 2/5 >>  L Rad Aline 2/5 >> 2/9   I reviewed CXR myself, left sided opacification noted, ETT out.  DISCUSSION: 71 y.o. M who underwent elective LUL lobectomy 02/05 (Dr. Cyndia Bent).  Later that evening had hypersomnolence > found to have acute hypercarbic respiratory failure therefore started on BiPAP.  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 Small left apical PTX following surgery - chest tubes in place Bibasilar atelectasis. Hx OSA (not on CPAP), former tobacco use. P:   Wean O2 for sats > 88%, down to 9L HFNC Continue BiPAP while asleep and in the hospital, will need arrangement for a CPAP or more likely autoPAP as outpatient (patient already has a sleep doctor [place] and can f/u with him). Ambulate PT  IS Flutter valve Xopeniex Q6  Intermittent CXR as needed  CARDIOVASCULAR A:  Hx PVD, HTN, HLD, CAD, atypical chest pain. P:  Change lopressor to PO Hold home lisinopril, atorvastatin for now Tele monitoring  RENAL A:   Acute oliguric renal failure, ? Period of hypoxemia / hypoperfusion > improved P:   Replace electrolytes as indicated BMET in AM [facility] IVF since able to  eat  GASTROINTESTINAL A:   GERD. Nutrition. 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 - extension noted throughout visceral pleura  No acute issues P:  Dr. Julien Nordmann following, appreciate input  Trend CBC   INFECTIOUS A:   No acute issues. P:   Establish PIV, D/Ced central line Hold off abx for now Cultures noted  NEUROLOGIC A:   Low back pain - was previously being worked up for surgery (Dr. Ronnald Ramp).  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 Hx depression. P:   Minimize sedating medications  Tramadol PRN  Home sertraline, gabapentin as able  FAMILY  - Updates: Patient updated bedside.  - Inter-disciplinary family meet or Palliative Care meeting due by:  02/11.  Discussed with PCCM-NP  Rush Farmer, M.D. [facility] Pulmonary/Critical Care Medicine. Pager: [phone]. After hours pager: [phone].

## 2017-02-07 NOTE — Progress Notes (Signed)
6 Days Post-Op Procedure(s) (LRB): FLEXIBLE BRONCHOSCOPY (N/A) THORACOTOMY/LEFT UPPER LOBECTOMY (Left) Subjective: Wants to move to a regular room C/o incisional pain  Objective: Vital signs in last 24 hours: Temp:  [97.4 F (36.3 C)-97.7 F (36.5 C)] 97.5 F (36.4 C) (02/11 0733) Pulse Rate:  [29-126] 61 (02/11 0700) Cardiac Rhythm: Normal sinus rhythm (02/10 2000) Resp:  [11-21] 14 (02/11 0700) BP: (100-177)/(60-112) 135/112 (02/11 0700) SpO2:  [91 %-99 %] 98 % (02/11 0700) FiO2 (%):  [50 %] 50 % (02/10 2326) Weight:  [202 lb 6.1 oz (91.8 kg)] 202 lb 6.1 oz (91.8 kg) (02/11 0700)  Hemodynamic parameters for last 24 hours:    Intake/Output from previous day: 02/10 0701 - 02/11 0700 In: 1242 [P.O.:640; I.V.:602] Out: 615 [Urine:615] Intake/Output this shift: No intake/output data recorded.  General appearance: alert, cooperative and no distress Neurologic: intact Heart: regular rate and rhythm Lungs: souunds better, no wheezing, decreased in bases Abdomen: normal findings: soft, non-tender Wound: clean and dry  Lab Results:  Recent Labs  02/05/17 0407 02/07/17 0242  WBC 10.7* 8.0  HGB 11.1* 10.8*  HCT 34.9* 34.5*  PLT 244 216   BMET:  Recent Labs  02/05/17 0407 02/07/17 0242  NA 141 140  K 3.9 3.8  CL 102 102  CO2 27 28  GLUCOSE 122* 103*  BUN 28* 33*  CREATININE 0.85 0.84  CALCIUM 9.2 9.1    PT/INR: No results for input(s): LABPROT, INR in the last 72 hours. ABG    Component Value Date/Time   PHART 7.409 02/05/2017 0336   HCO3 29.9 (H) 02/05/2017 0336   TCO2 27 02/03/2017 1108   ACIDBASEDEF 2.0 02/03/2017 1108   O2SAT 98.7 02/05/2017 0336   CBG (last 3)   Recent Labs  02/05/17 1233  GLUCAP 104*    Assessment/Plan: S/P Procedure(s) (LRB): FLEXIBLE BRONCHOSCOPY (N/A) THORACOTOMY/LEFT UPPER LOBECTOMY (Left) -looks better this morning Tolerated BIPAP last night Still on high flow nasal cannula at 10L CXR looks about the same IV  lasix this AM to see if that will help weqan O2 Ambulated last night   LOS: 6 days    [place] Nakayama 02/07/2017

## 2017-02-07 NOTE — Progress Notes (Signed)
Patient placed on BIPAP Qhs and tolerating well at this time. 5L O2 bleed in with SATs 98% and RR 20 currently.

## 2017-02-07 NOTE — Progress Notes (Signed)
      [address]       [employer] [address]             [phone]      PM rounds  Feels well this evening Weaning O2 down to 7 L at present Anxious to transfer out of ICU- possibly in AM  [place] C. Roxan Hockey, MD [facility] [phone]

## 2017-02-07 NOTE — Plan of Care (Signed)
Problem: Respiratory: Goal: Pain level will decrease with appropriate interventions Outcome: Progressing Pt demonstrates use of coughing pillow, splinting while moving. Requiring minimal analgesic support. Goal: Respiratory status will improve Outcome: Progressing Pt able to return demonstrate use of I.S. and flutter valve, increased volume to 1 L each time. Pt remains on HFNC at 10 L/m and agreeing to wear BIPAP QHS. PCXR ordered Qday to monitor for improved aeration.

## 2017-02-08 ENCOUNTER — Inpatient Hospital Stay (HOSPITAL_COMMUNITY): Payer: Medicare Other

## 2017-02-08 DIAGNOSIS — R911 Solitary pulmonary nodule: Secondary | ICD-10-CM

## 2017-02-08 DIAGNOSIS — J9811 Atelectasis: Secondary | ICD-10-CM

## 2017-02-08 DIAGNOSIS — G4733 Obstructive sleep apnea (adult) (pediatric): Secondary | ICD-10-CM

## 2017-02-08 LAB — CBC
HCT: 34.7 % — ABNORMAL LOW (ref 39.0–52.0)
HEMOGLOBIN: 11 g/dL — AB (ref 13.0–17.0)
MCH: 28.7 pg (ref 26.0–34.0)
MCHC: 31.7 g/dL (ref 30.0–36.0)
MCV: 90.6 fL (ref 78.0–100.0)
Platelets: 222 10*3/uL (ref 150–400)
RBC: 3.83 MIL/uL — ABNORMAL LOW (ref 4.22–5.81)
RDW: 14.3 % (ref 11.5–15.5)
WBC: 8.2 10*3/uL (ref 4.0–10.5)

## 2017-02-08 LAB — BASIC METABOLIC PANEL
ANION GAP: 9 (ref 5–15)
BUN: 31 mg/dL — ABNORMAL HIGH (ref 6–20)
CALCIUM: 9 mg/dL (ref 8.9–10.3)
CHLORIDE: 98 mmol/L — AB (ref 101–111)
CO2: 29 mmol/L (ref 22–32)
Creatinine, Ser: 0.85 mg/dL (ref 0.61–1.24)
GFR calc non Af Amer: >60 mL/min (ref >60)
Glucose, Bld: 108 mg/dL — ABNORMAL HIGH (ref 65–99)
POTASSIUM: 3.7 mmol/L (ref 3.5–5.1)
Sodium: 136 mmol/L (ref 135–145)

## 2017-02-08 MED ORDER — LISINOPRIL 10 MG PO TABS
20.0000 mg | ORAL_TABLET | Freq: Every day | ORAL | Status: DC
  Administered 2017-02-08: 20 mg via ORAL
  Filled 2017-02-08: qty 2

## 2017-02-08 MED ORDER — POTASSIUM CHLORIDE CRYS ER 20 MEQ PO TBCR
20.0000 meq | EXTENDED_RELEASE_TABLET | Freq: Two times a day (BID) | ORAL | Status: AC
  Administered 2017-02-08 (×2): 20 meq via ORAL
  Filled 2017-02-08: qty 1
  Filled 2017-02-08: qty 2

## 2017-02-08 NOTE — Progress Notes (Signed)
Speech Language Pathology Treatment: Dysphagia  Patient Details Name: Paul Valdez MRN: [id number] DOB: [birth year] Today's Date: 02/08/2017 Time: 2992-4268 SLP Time Calculation (min) (ACUTE ONLY): 17 min  Assessment / Plan / Recommendation Clinical Impression  Skilled observation complete with current diet. Patient independent with safe swallowing precautions and overall appears to be tolerating current diet. Noted CXR results as well as MD noted stating unliklihood of PNA. No further SLP needs indicated at this time.     HPI HPI: 71 year old male with PMH significant for CAD, OSA in process of getting CPAP, and HTN. Undergoing back pain workup for spine surgery and a 4cm LUL lung mass was discovered. He then presented to [facility] 02/05 for elective left upper lobectomy. The procedure was successful without complication and he was extubated post-opertively, however, several hours later he developed lethargy and was minimally responsive. ABG was done and demonstrated severe respiratory acidosis and he was started on BiPAP. Currently on HFNC. CXR today showed Worsening bilateral airspace disease, diffuse on the left and in theright lung base.      SLP Plan  All goals met     Recommendations  Diet recommendations: Regular;Thin liquid Liquids provided via: Cup;Straw Medication Administration: Whole meds with liquid Supervision: Patient able to self feed Compensations: Slow rate;Small sips/bites Postural Changes and/or Swallow Maneuvers: Seated upright 90 degrees                Oral Care Recommendations: Oral care BID Follow up Recommendations: None Plan: All goals met       [place] MA, CCC-SLP [phone]    Toris Laverdiere Meryl 02/08/2017, 4:29 PM

## 2017-02-08 NOTE — Plan of Care (Signed)
Problem: Activity: Goal: Risk for activity intolerance will decrease Outcome: Progressing Pt tolerates OOB for meals and ambulations with assistance 3 times per day.

## 2017-02-08 NOTE — Progress Notes (Signed)
PULMONARY / CRITICAL CARE MEDICINE   Name: Paul Valdez MRN: [id number] DOB: [birth year]    ADMISSION DATE:  02/01/2017 CONSULTATION DATE:  02/01/2017  REFERRING MD:  Dr. Roxan Hockey  CHIEF COMPLAINT:  Lethargy post op  Brief:   71 year old male with PMH as below, which is significant for CAD, OSA in process of getting CPAP, and HTN.   Undergoing back pain workup for spine surgery and a 4cm LUL lung mass was discovered - Imaging . He then presented to [facility] 02/05 for elective left upper lobectomy. The procedure was successful without complication and he was extubated post-opertively, however, several hours later he developed lethargy and was minimally responsive. ABG was done and demonstrated severe respiratory acidosis and he was started on BiPAP. ABG did not initially improve with BiPAP so PCCM was consulted.  SUBJECTIVE:   Continues to improve. Less SOB. Dry cough.     VITAL SIGNS: BP (!) 145/72   Pulse 68   Temp 98.6 F (37 C) (Oral)   Resp 16   Ht '5\' 10"'$  (1.778 m)   Wt 92.3 kg (203 lb 7.8 oz)   SpO2 (!) 89%   BMI 29.20 kg/m   HEMODYNAMICS:    VENTILATOR SETTINGS:    INTAKE / OUTPUT: I/O last 3 completed shifts: In: 2640 [P.O.:2540; I.V.:100] Out: 1425 [Urine:1425]  PHYSICAL EXAMINATION: General:  Elderly male in NAD, up in chair, eating breakfast HEENT: [place]/AT, PERRL, EOM-I and MMM PSY: Pleasant Neuro: Alert and oriented x4, moving all ext to command CV: RRR, Nl S1/S2, -M/R/G. PULM: Good ae, dec BS at bases. Some crackles at bases.  GI: Soft, NT, ND and +BS Extremities: -edema and -tenderness Skin: no rashes or lesions  LABS:  BMET  Recent Labs Lab 02/05/17 0407 02/07/17 0242 02/08/17 0320  NA 141 140 136  K 3.9 3.8 3.7  CL 102 102 98*  CO2 '27 28 29  '$ BUN 28* 33* 31*  CREATININE 0.85 0.84 0.85  GLUCOSE 122* 103* 108*   Electrolytes  Recent Labs Lab 02/04/17 0412 02/05/17 0407 02/07/17 0242 02/08/17 0320  CALCIUM 8.9 9.2 9.1 9.0   MG 2.0  --  2.2  --   PHOS 2.4*  --  5.1*  --    CBC  Recent Labs Lab 02/05/17 0407 02/07/17 0242 02/08/17 0320  WBC 10.7* 8.0 8.2  HGB 11.1* 10.8* 11.0*  HCT 34.9* 34.5* 34.7*  PLT 244 216 222   Coag's No results for input(s): APTT, INR in the last 168 hours.  Sepsis Markers No results for input(s): LATICACIDVEN, PROCALCITON, O2SATVEN in the last 168 hours.  ABG  Recent Labs Lab 02/03/17 1108 02/04/17 0305 02/05/17 0336  PHART 7.265* 7.374 7.409  PCO2ART 56.6* 51.3* 48.1*  PO2ART 80.0* 79.1* 152*   Liver Enzymes  Recent Labs Lab 02/03/17 0350  AST 31  ALT 18  ALKPHOS 59  BILITOT 0.7  ALBUMIN 3.1*   Cardiac Enzymes No results for input(s): TROPONINI, PROBNP in the last 168 hours.  Glucose  Recent Labs Lab 02/02/17 1506 02/02/17 1919 02/02/17 2337 02/03/17 0339 02/03/17 0728 02/05/17 1233  GLUCAP 113* 114* 119* 112* 118* 104*   Imaging Dg Chest Port 1 View  Result Date: 02/08/2017 CLINICAL DATA:  Shortness of breath. History of left upper lobectomy on February 01, 2017 EXAM: PORTABLE CHEST 1 VIEW COMPARISON:  PET-CT study of January 14, 2017 and chest x-ray of February 07, 2017 FINDINGS: The lung volumes remain low with elevation of the right hemidiaphragm.  There is persistent increased interstitial density in the mid and lower left lung and at the right lung base. There are probable posterior layering pleural effusions bilaterally. The cardiac silhouette remains enlarged. The central pulmonary vascularity is prominent. There surgical clips in the left axillary region. The observed bony thorax exhibits no acute abnormality. IMPRESSION: Persistent elevation of the right hemidiaphragm. Persistent bilateral interstitial and mild alveolar opacities compatible with pneumonia. Stable cardiomegaly without definite pulmonary edema. Electronically Signed   By: David  Martinique M.D.   On: 02/08/2017 07:19     STUDIES:  CXR 02/05 > small apical left PTX. Bibasilar  atx.  CULTURES: None.  ANTIBIOTICS: vanco x 1 post-op Cefuroxime x1   SIGNIFICANT EVENTS: 02/05 > elective LUL lobectomy.  LINES/TUBES: L IJ TLC 2/5 >>  L Rad Aline 2/5 >> 2/9   I reviewed CXR myself, left sided opacification noted, ETT out.  DISCUSSION: 71 y.o. M who underwent elective LUL lobectomy 02/05 (Dr. Cyndia Bent).  Later that evening had hypersomnolence > found to have acute hypercarbic respiratory failure therefore started on BiPAP.  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 Hx OSA (not on CPAP), former tobacco use. P:   Wean O2 for sats > 88%,down to 2L [place] Continue BiPAP while asleep and in the hospital, will need arrangement for a CPAP or more likely autoPAP as outpatient (patient already has a sleep doctor osborn and can f/u with him). Ambulate PT  IS Flutter valve Xopeniex Q6  Intermittent CXR as needed Needs to f/u with Pulmonary/ sleep MD re: cpap once discharged.   PCCM will sign off for now.  Pls call back if with issues.    Monica Becton, MD 02/08/2017, 11:01 AM [facility] Pager [phone] After 3 pm or if no answer, call [phone]

## 2017-02-08 NOTE — Progress Notes (Signed)
Attempted to call RN on [facility]West, unable to take report at this time, a/w callback  Kathleen Argue S 11:26 AM

## 2017-02-08 NOTE — Progress Notes (Signed)
Patient has arrived on unit from 2s Patient oriented to unit, assessed, placed on tele, VS were stable.

## 2017-02-08 NOTE — Progress Notes (Signed)
Pt transferred to 2W36 via wheelchair, vss, settled in bed, receiving RN and NT at bedside, next of kin at bedside, all pt belongings transferred with pt.  Kathleen Argue S 12:10 PM

## 2017-02-08 NOTE — Progress Notes (Signed)
Pt is on NIV QHS at this time tolerating it well.  

## 2017-02-08 NOTE — Progress Notes (Signed)
received report 

## 2017-02-08 NOTE — Progress Notes (Signed)
7 Days Post-Op Procedure(s) (LRB): FLEXIBLE BRONCHOSCOPY (N/A) THORACOTOMY/LEFT UPPER LOBECTOMY (Left) Subjective:  No complaints. Ambulated 300 ft this am Used CPAP overnight  Objective: Vital signs in last 24 hours: Temp:  [96.9 F (36.1 C)-98.6 F (37 C)] 98.6 F (37 C) (02/12 0700) Pulse Rate:  [54-101] 62 (02/12 0700) Cardiac Rhythm: Sinus bradycardia (02/12 0000) Resp:  [11-24] 14 (02/12 0700) BP: (97-193)/(65-96) 171/93 (02/12 0700) SpO2:  [91 %-100 %] 96 % (02/12 0700) Weight:  [92.3 kg (203 lb 7.8 oz)] 92.3 kg (203 lb 7.8 oz) (02/12 0400)  Hemodynamic parameters for last 24 hours:    Intake/Output from previous day: 02/11 0701 - 02/12 0700 In: 1940 [P.O.:1940] Out: 1200 [Urine:1200] Intake/Output this shift: No intake/output data recorded.  General appearance: alert and cooperative Heart: regular rate and rhythm, S1, S2 normal, no murmur, click, rub or gallop Lungs: clear to auscultation bilaterally Extremities: extremities normal, atraumatic, no cyanosis or edema Wound: incision ok  Lab Results:  Recent Labs  02/07/17 0242 02/08/17 0320  WBC 8.0 8.2  HGB 10.8* 11.0*  HCT 34.5* 34.7*  PLT 216 222   BMET:  Recent Labs  02/07/17 0242 02/08/17 0320  NA 140 136  K 3.8 3.7  CL 102 98*  CO2 28 29  GLUCOSE 103* 108*  BUN 33* 31*  CREATININE 0.84 0.85  CALCIUM 9.1 9.0    PT/INR: No results for input(s): LABPROT, INR in the last 72 hours. ABG    Component Value Date/Time   PHART 7.409 02/05/2017 0336   HCO3 29.9 (H) 02/05/2017 0336   TCO2 27 02/03/2017 1108   ACIDBASEDEF 2.0 02/03/2017 1108   O2SAT 98.7 02/05/2017 0336   CBG (last 3)   Recent Labs  02/05/17 1233  GLUCAP 104*   CXR: stable  Assessment/Plan: S/P Procedure(s) (LRB): FLEXIBLE BRONCHOSCOPY (N/A) THORACOTOMY/LEFT UPPER LOBECTOMY (Left)  He remains hemodynamically stable in sinus rhythm with HTN. Continue Lopressor and resume lisinopril 20 mg.  Pulmonary status  continues to improve. He is on 5L HFNC this am with sats 97%. Wean to keep sats 90%. Preop RA sat only 91%. CXR looks better but still some mild opacities bilat. I think this is likely some atelectasis, not pneumonia. He has no fever, normal WBC and sounds good. This should improve with time. 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  Will transfer to 2W and continue IS, flutter valve and ambulation.   LOS: 7 days    Paul Valdez 02/08/2017

## 2017-02-08 NOTE — Progress Notes (Signed)
Physical Therapy Treatment Patient Details Name: Paul Valdez MRN: [id number] DOB: 07/25/1946 Today's Date: 02/08/2017    History of Present Illness 71 yo admitted for LUL lobectomy after mass found on scan in prep for back surgery. Pt with respiratory acidosis and lethargy post op. PMHx: back pain, CAD, oA, HTN    PT Comments    Pt very pleasant, improved cognition and mobility from last session. Pt alert, joking and able to recall events since admission. Pt with availability of friend to provide assist at home and plans to hire housekeeper for D/C. With improved function, gait and cognition return home is very reasonable. Pt declined HEP at this time due to fatigue. Will continue to follow to maximize function and activity.   HR 81-117 sats 88-95% on 2L   Follow Up Recommendations  Home health PT;Supervision - Intermittent     Equipment Recommendations  Rolling walker with 5" wheels;3in1 (PT)    Recommendations for Other Services       Precautions / Restrictions Precautions Precautions: Fall Precaution Comments: watch sats    Mobility  Bed Mobility Overal bed mobility: Modified Independent             General bed mobility comments: EOB on arrival, mod I to return to bed  Transfers Overall transfer level: Modified independent                  Ambulation/Gait Ambulation/Gait assistance: Min guard Ambulation Distance (Feet): 400 Feet Assistive device: Rolling walker (2 wheeled) Gait Pattern/deviations: Step-through pattern;Decreased stride length;Trunk flexed   Gait velocity interpretation: Below normal speed for age/gender General Gait Details: cues for posture and position in RW. cues for breathing technique and energy conservation. Pt with sats dropping to 88% on 2L with gait, 2 standing rests   Stairs            Wheelchair Mobility    Modified Rankin (Stroke Patients Only)       Balance Overall balance assessment: Needs assistance    Sitting balance-Leahy Scale: Good       Standing balance-Leahy Scale: Fair                      Cognition Arousal/Alertness: Awake/alert Behavior During Therapy: WFL for tasks assessed/performed Overall Cognitive Status: Within Functional Limits for tasks assessed                      Exercises      General Comments        Pertinent Vitals/Pain Pain Assessment: No/denies pain    Home Living                      Prior Function            PT Goals (current goals can now be found in the care plan section) Progress towards PT goals: Progressing toward goals    Frequency           PT Plan Discharge plan needs to be updated    Co-evaluation             End of Session Equipment Utilized During Treatment: Oxygen Activity Tolerance: Patient tolerated treatment well Patient left: in bed;with call bell/phone within reach     Time: 1218-1238 PT Time Calculation (min) (ACUTE ONLY): 20 min  Charges:  $Gait Training: 8-22 mins  G Codes:      Sandy Salaam Dago Jungwirth 02/08/2017, 12:55 PM  Elwyn Reach, [place]

## 2017-02-09 ENCOUNTER — Inpatient Hospital Stay (HOSPITAL_COMMUNITY): Payer: Medicare Other

## 2017-02-09 LAB — BASIC METABOLIC PANEL
ANION GAP: 10 (ref 5–15)
BUN: 51 mg/dL — ABNORMAL HIGH (ref 6–20)
CHLORIDE: 98 mmol/L — AB (ref 101–111)
CO2: 27 mmol/L (ref 22–32)
Calcium: 9.1 mg/dL (ref 8.9–10.3)
Creatinine, Ser: 1.89 mg/dL — ABNORMAL HIGH (ref 0.61–1.24)
GFR calc non Af Amer: 34 mL/min — ABNORMAL LOW (ref >60)
GFR, EST AFRICAN AMERICAN: 39 mL/min — AB (ref >60)
Glucose, Bld: 104 mg/dL — ABNORMAL HIGH (ref 65–99)
POTASSIUM: 4.4 mmol/L (ref 3.5–5.1)
Sodium: 135 mmol/L (ref 135–145)

## 2017-02-09 LAB — CBC
HEMATOCRIT: 36.5 % — AB (ref 39.0–52.0)
HEMOGLOBIN: 11.6 g/dL — AB (ref 13.0–17.0)
MCH: 29 pg (ref 26.0–34.0)
MCHC: 31.8 g/dL (ref 30.0–36.0)
MCV: 91.3 fL (ref 78.0–100.0)
Platelets: 318 10*3/uL (ref 150–400)
RBC: 4 MIL/uL — AB (ref 4.22–5.81)
RDW: 14.5 % (ref 11.5–15.5)
WBC: 12.6 10*3/uL — ABNORMAL HIGH (ref 4.0–10.5)

## 2017-02-09 LAB — MAGNESIUM: Magnesium: 2 mg/dL (ref 1.7–2.4)

## 2017-02-09 LAB — TSH: TSH: 0.959 u[IU]/mL (ref 0.350–4.500)

## 2017-02-09 MED ORDER — METOPROLOL TARTRATE 25 MG PO TABS
25.0000 mg | ORAL_TABLET | Freq: Two times a day (BID) | ORAL | Status: DC
  Administered 2017-02-10: 25 mg via ORAL
  Filled 2017-02-09 (×2): qty 1

## 2017-02-09 MED ORDER — METOPROLOL TARTRATE 50 MG PO TABS
50.0000 mg | ORAL_TABLET | Freq: Two times a day (BID) | ORAL | Status: DC
  Filled 2017-02-09: qty 1

## 2017-02-09 MED ORDER — LEVALBUTEROL HCL 0.63 MG/3ML IN NEBU
0.6300 mg | INHALATION_SOLUTION | Freq: Two times a day (BID) | RESPIRATORY_TRACT | Status: DC
  Administered 2017-02-09 – 2017-02-10 (×2): 0.63 mg via RESPIRATORY_TRACT
  Filled 2017-02-09 (×2): qty 3

## 2017-02-09 MED ORDER — DIGOXIN 0.25 MG/ML IJ SOLN
0.5000 mg | Freq: Every day | INTRAMUSCULAR | Status: AC
  Administered 2017-02-09: 0.5 mg via INTRAVENOUS
  Filled 2017-02-09: qty 2

## 2017-02-09 MED ORDER — DIGOXIN 125 MCG PO TABS: 0.1250 mg | ORAL_TABLET | Freq: Every day | ORAL | Status: DC

## 2017-02-09 MED ORDER — AMIODARONE HCL 200 MG PO TABS: 400.0000 mg | ORAL_TABLET | Freq: Two times a day (BID) | ORAL | Status: DC

## 2017-02-09 MED ORDER — ALUM & MAG HYDROXIDE-SIMETH 200-200-20 MG/5ML PO SUSP: 30.0000 mL | ORAL | Status: DC | PRN

## 2017-02-09 NOTE — Progress Notes (Signed)
Pt's rhythm changed from NSR to A fib. He denies SOB or Chest pain. Pt is asymptomatic. Rates in the 90s. EKG performed. Vitals stable. MD notified. Will await orders.

## 2017-02-09 NOTE — Progress Notes (Addendum)
[address]       [place] [address]             [phone]      8 Days Post-Op Procedure(s) (LRB): FLEXIBLE BRONCHOSCOPY (N/A) THORACOTOMY/LEFT UPPER LOBECTOMY (Left) Subjective: Feels fair, somewhat weak  Objective: Vital signs in last 24 hours: Temp:  [97.5 F (36.4 C)-98 F (36.7 C)] 98 F (36.7 C) (02/12 2121) Pulse Rate:  [64-117] 96 (02/13 0510) Cardiac Rhythm: Atrial fibrillation;Other (Comment) (02/13 0340) Resp:  [14-119] 20 (02/13 0351) BP: (80-162)/(47-85) 130/76 (02/13 0513) SpO2:  [89 %-95 %] 92 % (02/13 0351)  Hemodynamic parameters for last 24 hours:    Intake/Output from previous day: 02/12 0701 - 02/13 0700 In: 480 [P.O.:480] Out: 200 [Urine:200] Intake/Output this shift: No intake/output data recorded.  General appearance: alert, cooperative, fatigued and no distress Heart: irregularly irregular rhythm Lungs: coarse with expiratory prolongation Abdomen: benign Extremities: no edema Wound: incis healig well  Lab Results:  Recent Labs  02/07/17 0242 02/08/17 0320  WBC 8.0 8.2  HGB 10.8* 11.0*  HCT 34.5* 34.7*  PLT 216 222   BMET:  Recent Labs  02/07/17 0242 02/08/17 0320  NA 140 136  K 3.8 3.7  CL 102 98*  CO2 28 29  GLUCOSE 103* 108*  BUN 33* 31*  CREATININE 0.84 0.85  CALCIUM 9.1 9.0    PT/INR: No results for input(s): LABPROT, INR in the last 72 hours. ABG    Component Value Date/Time   PHART 7.409 02/05/2017 0336   HCO3 29.9 (H) 02/05/2017 0336   TCO2 27 02/03/2017 1108   ACIDBASEDEF 2.0 02/03/2017 1108   O2SAT 98.7 02/05/2017 0336   CBG (last 3)  No results for input(s): GLUCAP in the last 72 hours.  Meds Scheduled Meds: . amiodarone  400 mg Oral BID  . chlorhexidine  15 mL Mouth Rinse BID  . enoxaparin (LOVENOX) injection  40 mg Subcutaneous Q24H  . gabapentin  600 mg Oral TID  . levalbuterol  0.63 mg Nebulization TID  . mouth rinse  15 mL Mouth Rinse q12n4p  . metoprolol tartrate   25 mg Oral BID  . senna-docusate  1 tablet Oral QHS  . sertraline  100 mg Oral Daily  . sodium chloride flush  3 mL Intracatheter Q12H   Continuous Infusions: PRN Meds:.metoprolol, sodium chloride flush, traMADol  Xrays Dg Chest Port 1 View  Result Date: 02/08/2017 CLINICAL DATA:  Shortness of breath. History of left upper lobectomy on February 01, 2017 EXAM: PORTABLE CHEST 1 VIEW COMPARISON:  PET-CT study of January 14, 2017 and chest x-ray of February 07, 2017 FINDINGS: The lung volumes remain low with elevation of the right hemidiaphragm. There is persistent increased interstitial density in the mid and lower left lung and at the right lung base. There are probable posterior layering pleural effusions bilaterally. The cardiac silhouette remains enlarged. The central pulmonary vascularity is prominent. There surgical clips in the left axillary region. The observed bony thorax exhibits no acute abnormality. IMPRESSION: Persistent elevation of the right hemidiaphragm. Persistent bilateral interstitial and mild alveolar opacities compatible with pneumonia. Stable cardiomegaly without definite pulmonary edema. Electronically Signed   By: David  Martinique M.D.   On: 02/08/2017 07:19    Assessment/Plan: S/P Procedure(s) (LRB): FLEXIBLE BRONCHOSCOPY (N/A) THORACOTOMY/LEFT UPPER LOBECTOMY (Left)  1 steady slow progress 2 afib with RVR, now on amio, will stop with some risk of pulm toxicity - add digoxin and increase betablocker dose for  now 3 cont pulm toilet and xopenex, wean O2 as able, CXR pending 4 increase activity as able 5 labs pending, add magnesium and TSH 6 Low BP - stopped lisinopril for now as got into 73'A systolic  LOS: 8 days    GOLD,WAYNE E 02/09/2017   Chart reviewed, patient examined, agree with above.  His CXR looks better today.  He is in atrial fib with rate 90's most of the time. Just started last night. Amiodarone started but we stopped it today due to risk of pulmonary  toxicity in this pt. Will use BB and digoxin. If still in it tomorrow will discuss with cardiology and consider anticoagulation. He has been followed by Dr. Irish Lack and saw him for preop cardiac clearance.  His labs today are significant for a rise in his creatinine from 0.85 yesterday to 1.89. This is likely due to lisinopril started yesterday which also dramatically lowered his BP which dropped into the 70-80 range overnight. He is used to HTN. I have stopped the lisinopril and we will have to tolerate a higher BP.

## 2017-02-09 NOTE — Progress Notes (Signed)
Physical Therapy Treatment Patient Details Name: Paul Valdez MRN: [id number] DOB: 12-29-1945 Today's Date: 02/09/2017    History of Present Illness 71 yo admitted for LUL lobectomy after mass found on scan in prep for back surgery. Pt with respiratory acidosis and lethargy post op. PMHx: back pain, CAD, oA, HTN    PT Comments    Patient able to ambulate 59' with RW and min guard assist.  Slightly unsteady today with general weakness, and fearful of falling again.  Will need to function at Mod I level to return home alone.  Will continue to assess.  Follow Up Recommendations  Home health PT;Supervision - Intermittent     Equipment Recommendations  Rolling walker with 5" wheels;3in1 (PT)    Recommendations for Other Services OT consult     Precautions / Restrictions Precautions Precautions: Fall Precaution Comments: Per chart, patient had fall 2/12 - "knees gave away" Restrictions Weight Bearing Restrictions: No    Mobility  Bed Mobility Overal bed mobility: Modified Independent                Transfers Overall transfer level: Needs assistance Equipment used: Rolling walker (2 wheeled) Transfers: Sit to/from Stand Sit to Stand: Min guard         General transfer comment: cues for hand placement, sequence, safety  Ambulation/Gait Ambulation/Gait assistance: Min guard Ambulation Distance (Feet): 84 Feet Assistive device: Rolling walker (2 wheeled) Gait Pattern/deviations: Step-through pattern;Decreased stride length;Trunk flexed Gait velocity: decreased Gait velocity interpretation: Below normal speed for age/gender General Gait Details: Verbal cues to stand upright during gait, and keep feet inside RW.  LE's unsteady in stance.   Stairs            Wheelchair Mobility    Modified Rankin (Stroke Patients Only)       Balance Overall balance assessment: Needs assistance         Standing balance support: Bilateral upper extremity  supported Standing balance-Leahy Scale: Poor                      Cognition Arousal/Alertness: Awake/alert Behavior During Therapy: Anxious;Flat affect Overall Cognitive Status: Within Functional Limits for tasks assessed                      Exercises      General Comments        Pertinent Vitals/Pain Pain Assessment: 0-10 Pain Score: 3  Pain Location: left chest Pain Descriptors / Indicators: Sore Pain Intervention(s): Monitored during session;Repositioned    Home Living                      Prior Function            PT Goals (current goals can now be found in the care plan section) Acute Rehab PT Goals Patient Stated Goal: return home Progress towards PT goals: Not progressing toward goals - comment (Fall 2/12 - LE weakness.  Limited distance today.)    Frequency    Min 3X/week      PT Plan Current plan remains appropriate    Co-evaluation             End of Session Equipment Utilized During Treatment: Gait belt;Oxygen Activity Tolerance: Patient tolerated treatment well;Patient limited by fatigue Patient left: in bed;with call bell/phone within reach     Time: 1145-1206 PT Time Calculation (min) (ACUTE ONLY): 21 min  Charges:  $Gait Training: 8-22 mins  G Codes:      Despina Pole 02/09/2017, 1:44 PM Carita Pian. Sanjuana Kava, [place] Pager [phone]

## 2017-02-09 NOTE — Care Management Important Message (Signed)
Important Message  Patient Details  Name: Paul Valdez MRN: [id number] Date of Birth: 09-29-1946   Medicare Important Message Given:  Yes    [name] May 02/09/2017, 1:15 PM

## 2017-02-09 NOTE — Progress Notes (Signed)
02/09/2017- Pt placed on bipap for the night- tolerating well.

## 2017-02-10 DIAGNOSIS — I480 Paroxysmal atrial fibrillation: Secondary | ICD-10-CM

## 2017-02-10 DIAGNOSIS — I483 Typical atrial flutter: Secondary | ICD-10-CM

## 2017-02-10 LAB — URINALYSIS, ROUTINE W REFLEX MICROSCOPIC
Bilirubin Urine: NEGATIVE
GLUCOSE, UA: NEGATIVE mg/dL
KETONES UR: NEGATIVE mg/dL
LEUKOCYTES UA: NEGATIVE
Nitrite: NEGATIVE
PH: 6 (ref 5.0–8.0)
PROTEIN: 100 mg/dL — AB
Specific Gravity, Urine: 1.01 (ref 1.005–1.030)

## 2017-02-10 LAB — BASIC METABOLIC PANEL
Anion gap: 8 (ref 5–15)
BUN: 50 mg/dL — AB (ref 6–20)
CO2: 29 mmol/L (ref 22–32)
Calcium: 8.7 mg/dL — ABNORMAL LOW (ref 8.9–10.3)
Chloride: 99 mmol/L — ABNORMAL LOW (ref 101–111)
Creatinine, Ser: 1.38 mg/dL — ABNORMAL HIGH (ref 0.61–1.24)
GFR calc Af Amer: 58 mL/min — ABNORMAL LOW (ref >60)
GFR, EST NON AFRICAN AMERICAN: 50 mL/min — AB (ref >60)
GLUCOSE: 113 mg/dL — AB (ref 65–99)
Potassium: 4.7 mmol/L (ref 3.5–5.1)
SODIUM: 136 mmol/L (ref 135–145)

## 2017-02-10 LAB — CBC
HEMATOCRIT: 34.8 % — AB (ref 39.0–52.0)
Hemoglobin: 11 g/dL — ABNORMAL LOW (ref 13.0–17.0)
MCH: 28.7 pg (ref 26.0–34.0)
MCHC: 31.6 g/dL (ref 30.0–36.0)
MCV: 90.9 fL (ref 78.0–100.0)
PLATELETS: 271 10*3/uL (ref 150–400)
RBC: 3.83 MIL/uL — ABNORMAL LOW (ref 4.22–5.81)
RDW: 14.7 % (ref 11.5–15.5)
WBC: 11.3 10*3/uL — ABNORMAL HIGH (ref 4.0–10.5)

## 2017-02-10 MED ORDER — METOPROLOL TARTRATE 50 MG PO TABS
50.0000 mg | ORAL_TABLET | Freq: Two times a day (BID) | ORAL | Status: DC
  Administered 2017-02-10 – 2017-02-12 (×3): 50 mg via ORAL
  Filled 2017-02-10 (×4): qty 1

## 2017-02-10 MED ORDER — PHENAZOPYRIDINE HCL 100 MG PO TABS
100.0000 mg | ORAL_TABLET | Freq: Three times a day (TID) | ORAL | Status: DC
  Administered 2017-02-10 – 2017-02-12 (×7): 100 mg via ORAL
  Filled 2017-02-10 (×8): qty 1

## 2017-02-10 MED ORDER — METOPROLOL TARTRATE 25 MG PO TABS
25.0000 mg | ORAL_TABLET | Freq: Once | ORAL | Status: AC
  Administered 2017-02-10: 25 mg via ORAL
  Filled 2017-02-10: qty 1

## 2017-02-10 MED ORDER — APIXABAN 5 MG PO TABS
5.0000 mg | ORAL_TABLET | Freq: Two times a day (BID) | ORAL | Status: DC
  Administered 2017-02-10 – 2017-02-12 (×4): 5 mg via ORAL
  Filled 2017-02-10 (×4): qty 1

## 2017-02-10 MED ORDER — LEVALBUTEROL HCL 0.63 MG/3ML IN NEBU: 0.6300 mg | INHALATION_SOLUTION | Freq: Four times a day (QID) | RESPIRATORY_TRACT | Status: DC | PRN

## 2017-02-10 MED ORDER — TAMSULOSIN HCL 0.4 MG PO CAPS
0.4000 mg | ORAL_CAPSULE | Freq: Every day | ORAL | Status: DC
  Administered 2017-02-10 – 2017-02-12 (×3): 0.4 mg via ORAL
  Filled 2017-02-10 (×3): qty 1

## 2017-02-10 MED ORDER — DILTIAZEM HCL 30 MG PO TABS
30.0000 mg | ORAL_TABLET | Freq: Four times a day (QID) | ORAL | Status: DC
  Administered 2017-02-10 – 2017-02-12 (×5): 30 mg via ORAL
  Filled 2017-02-10 (×7): qty 1

## 2017-02-10 NOTE — Progress Notes (Addendum)
[place] ParkSuite [address]       [employer] [address]             [phone]      9 Days Post-Op Procedure(s) (LRB): FLEXIBLE BRONCHOSCOPY (N/A) THORACOTOMY/LEFT UPPER LOBECTOMY (Left) Subjective: conts with afib, rate control improving ` Objective: Vital signs in last 24 hours: Temp:  [97.6 F (36.4 C)-98.7 F (37.1 C)] 97.6 F (36.4 C) (02/14 0457) Pulse Rate:  [60-100] 100 (02/14 0457) Cardiac Rhythm: Atrial fibrillation (02/13 2030) Resp:  [18-20] 20 (02/14 0457) BP: (71-132)/(0-70) 132/70 (02/14 0457) SpO2:  [91 %-98 %] 95 % (02/14 0457)  Hemodynamic parameters for last 24 hours:    Intake/Output from previous day: 02/13 0701 - 02/14 0700 In: 1080 [P.O.:1080] Out: 325 [Urine:325] Intake/Output this shift: No intake/output data recorded.  General appearance: alert, cooperative and no distress Heart: irregularly irregular rhythm Lungs: clear to auscultation bilaterally Abdomen: benign Extremities: no edema Wound: incis healing well  Lab Results:  Recent Labs  02/09/17 1216 02/10/17 0449  WBC 12.6* 11.3*  HGB 11.6* 11.0*  HCT 36.5* 34.8*  PLT 318 271   BMET:  Recent Labs  02/09/17 1216 02/10/17 0449  NA 135 136  K 4.4 4.7  CL 98* 99*  CO2 27 29  GLUCOSE 104* 113*  BUN 51* 50*  CREATININE 1.89* 1.38*  CALCIUM 9.1 8.7*    PT/INR: No results for input(s): LABPROT, INR in the last 72 hours. ABG    Component Value Date/Time   PHART 7.409 02/05/2017 0336   HCO3 29.9 (H) 02/05/2017 0336   TCO2 27 02/03/2017 1108   ACIDBASEDEF 2.0 02/03/2017 1108   O2SAT 98.7 02/05/2017 0336   CBG (last 3)  No results for input(s): GLUCAP in the last 72 hours.  Meds Scheduled Meds: . chlorhexidine  15 mL Mouth Rinse BID  . enoxaparin (LOVENOX) injection  40 mg Subcutaneous Q24H  . gabapentin  600 mg Oral TID  . levalbuterol  0.63 mg Nebulization BID  . mouth rinse  15 mL Mouth Rinse q12n4p  . metoprolol tartrate  25 mg Oral BID  .  senna-docusate  1 tablet Oral QHS  . sertraline  100 mg Oral Daily  . sodium chloride flush  3 mL Intracatheter Q12H   Continuous Infusions: PRN Meds:.alum & mag hydroxide-simeth, metoprolol, sodium chloride flush, traMADol  Xrays Dg Chest 2 View  Result Date: 02/09/2017 CLINICAL DATA:  Lung surgery. EXAM: CHEST  2 VIEW COMPARISON:  02/08/2017 FINDINGS: Mediastinum stable. Heart size stable Postsurgical changes left lung. Interim partial clearing of left lower lobe atelectasis and infiltrates. Persistent low lung volumes. Sliding hiatal hernia. Stable elevation right hemidiaphragm . Diffuse osteopenia degenerative change thoracic spine. IMPRESSION: Postsurgical changes left lung. Partial clearing of left lower lobe atelectasis and infiltrate. Persistent low lung volumes. 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 He continues to progress. Respiratory status is fairly stable but I  think he will need to go home on oxygen for a while. He is still in atrial fib with RVR. Since creat is coming back down will resume daily digoxin. No amiodarone. Cardiology to see and make anticoagulation rec.  I would not put him back on lisinopril again due to marked lowering of BP and rise in creat. Hopefully he can go home in the next couple of days. He will need HHRN to check on him.

## 2017-02-10 NOTE — Progress Notes (Signed)
PT Cancellation Note  Patient Details Name: Paul Valdez MRN: [id number] DOB: 09-04-1946   Cancelled Treatment:    Reason Eval/Treat Not Completed: Other (comment) (with nursing x 2 attempts).  Will try later as time and pt allow.   Ramond Dial 02/10/2017, 2:11 PM   Mee Hives, PT MS Acute Rehab Dept. Number: [facility]

## 2017-02-10 NOTE — Significant Event (Signed)
Patient with frequent and urgent urination all day today, voids 50-150cc with each times. Patient begins to c/o burning sensation now when passing urine. Bladder scan done, only received 48cc. RN paged and spoke with [place]. Received new orders for urine culture and UA.     Paul Valdez

## 2017-02-10 NOTE — Progress Notes (Signed)
H.R. Up 120's on bsc H.R. Back down once back in bed low 100's.

## 2017-02-10 NOTE — Significant Event (Cosign Needed)
Patient with continue elevated HR 120-130 since after ambulation. Stable BP. Spoke with [facility] and received new orders for one time dose of metoprolol '25mg'$ , and increase scheduled metoprolol from '25mg'$  to '50mg'$ .      Paul Valdez

## 2017-02-10 NOTE — Consult Note (Signed)
Cardiology Consult    Patient ID: ACESON LABELL MRN: [id number], DOB/AGE: 1946-11-14   Admit date: 02/01/2017 Date of Consult: 02/10/2017  Primary Physician: Gara Kroner, MD Reason for Consult: Atrial Fibrillation with RVR Primary Cardiologist: Dr. Irish Lack Requesting Provider: Dr. Cyndia Bent   History of Present Illness    LENVIL SWAIM is a 71 y.o. male with past medical history of atypical chest pain (normal NST in 2014), carotid artery disease, PVD, HTN, and HLD who presented to [facility] on 02/01/2017 for bronchoscopy and thoracotomy with left upper lobectomy. Pathology showed squamous cell lung cancer.   Was recently seen in the office by Dr. Irish Lack on 01/22/2017 for cardiac clearance in regards to a recent 4cm LUL mass with CT showing 4.3 x 3.4 x 3.8 cm mass suspicious for lung cancer. He denied any recent chest discomfort. Was cleared for surgery without further testing.   No immediate complications were noted during surgery but afterwards he was found to be encephalopathic and in hypercarbic respiratory failure following extubation. CCM was consulted and he was placed on BiPAP. He did have AKI following surgery and was on Dopamine 37mg for renal perfusion.  He has been weaned from BiPAP to HFNC. On 02/09/2017, he was noted to have gone from NSR to atrial fibrillation with HR in the 90's. He was asymptomatic with this. IV Amiodarone was started initially but later discontinued due to the risk of Pulmonary toxicity. He had been recently restarted on Lisinopril but became hypotensive with this around the same time, with SBP in the 70's - 80's. Creatinine trended up to 1.89 with this as well, but improved to 1.38 this AM. BP stable at 139/83 on most recent check.   He has been started on Lopressor 231mBID and it was mentioned in notes earlier to resume Digoxin but this has not yet been done. His HR is currently in the 120's - 140's and it appears he is in atrial flutter at times. He  is asymptomatic with this, denying any chest discomfort, palpitations, or worsening shortness of breath.   Past Medical History   Past Medical History:  Diagnosis Date  . Arthritis   . Asthma   . Atypical chest pain    a. Normal nuc 2014.  . [place]   a. Carotid duplex 2015: 4000-92%ICA, 12-30-28%ICA.   . Marland Kitchenataract   . Coronary artery calcification seen on CT scan   . Depression   . Detached retina   . Former consumption of alcohol   . Former tobacco use   . GERD (gastroesophageal reflux disease)    "I take heart burn medicine"  . Glaucoma   . Hyperlipemia   . Hypertension   . Low back pain 12/29/2016  . Mass of upper lobe of left lung 12/29/2016  . PVD (peripheral vascular disease) ([place]   a. Mild plaque of iliacs in 2013 on duplex; PET 2018:  PET also corroborated coronary, aortic arch, and branch vessel atherosclerotic vascular disease as well as aortoiliac atherosclerotic vascular disease.  . Sleep apnea    has not gotten CPAP yet    Past Surgical History:  Procedure Laterality Date  . APPENDECTOMY    . COLONOSCOPY    . EYE SURGERY    . FLEXIBLE BRONCHOSCOPY N/A 02/01/2017   Procedure: FLEXIBLE BRONCHOSCOPY;  Surgeon: BrGaye PollackMD;  Location: [facility] OR;  Service: Thoracic;  Laterality: N/A;  . GANGLION CYST EXCISION  left hand  . HERNIA REPAIR     double hernia repair  . THORACOTOMY/LOBECTOMY Left 02/01/2017   Procedure: THORACOTOMY/LEFT UPPER LOBECTOMY;  Surgeon: Gaye Pollack, MD;  Location: [facility] OR;  Service: Thoracic;  Laterality: Left;  . TUMOR REMOVAL     non cancer tumor from neck     Allergies  Allergies  Allergen Reactions  . Colchicine Diarrhea    Inpatient Medications    . chlorhexidine  15 mL Mouth Rinse BID  . enoxaparin (LOVENOX) injection  40 mg Subcutaneous Q24H  . gabapentin  600 mg Oral TID  . levalbuterol  0.63 mg Nebulization BID  . mouth rinse  15 mL Mouth Rinse q12n4p  . metoprolol tartrate  25 mg Oral BID  .  senna-docusate  1 tablet Oral QHS  . sertraline  100 mg Oral Daily  . sodium chloride flush  3 mL Intracatheter Q12H    Family History    Family History  Problem Relation Age of Onset  . Colon cancer Father   . Hypertension Father   . Hypertension Mother   . Colon cancer Sister   . Colon cancer Brother     Social History    Social History   Social History  . Marital status: Single    Spouse name: N/A  . Number of children: N/A  . Years of education: N/A   Occupational History  . Not on file.   Social History Main Topics  . Smoking status: Former Research scientist (life sciences)  . Smokeless tobacco: Never Used  . Alcohol use No  . Drug use: No  . Sexual activity: Not on file   Other Topics Concern  . Not on file   Social History Narrative  . No narrative on file     Review of Systems    General:  No chills, fever, night sweats or weight changes.  Cardiovascular:  No chest pain, edema, orthopnea, palpitations, paroxysmal nocturnal dyspnea. Positive for dyspnea with exertion.  Dermatological: No rash, lesions/masses Respiratory: No cough, dyspnea Urologic: No hematuria, dysuria Abdominal:   No nausea, vomiting, diarrhea, bright red blood per rectum, melena, or hematemesis Neurologic:  No visual changes, wkns, changes in mental status. All other systems reviewed and are otherwise negative except as noted above.  Physical Exam    Blood pressure (!) 118/100, pulse 74, temperature 97.7 F (36.5 C), temperature source Oral, resp. rate 20, height _0  (1.778 m), weight 203 lb 7.8 oz (92.3 kg), SpO2 95 %.  General: Pleasant, elderly Caucasian male appNAD Psych: Normal affect. Neuro: Alert and oriented X 3. Moves all extremities spontaneously. HEENT: Normal  Neck: Supple without bruits or JVD. Lungs:  Resp regular and unlabored, expiratory wheezing along upper lung fields. Heart: Irregularly irregular. No s3, s4, or murmurs. Abdomen: Soft, non-tender, non-distended, BS + x 4.    Extremities: No clubbing, cyanosis or edema. DP/PT/Radials 2+ and equal bilaterally.  Labs    Troponin (Point of Care Test) No results for input(s): TROPIPOC in the last 72 hours. No results for input(s): CKTOTAL, CKMB, TROPONINI in the last 72 hours. Lab Results  Component Value Date   WBC 11.3 (H) 02/10/2017   HGB 11.0 (L) 02/10/2017   HCT 34.8 (L) 02/10/2017   MCV 90.9 02/10/2017   PLT 271 02/10/2017    Recent Labs Lab 02/10/17 0449  NA 136  K 4.7  CL 99*  CO2 29  BUN 50*  CREATININE 1.38*  CALCIUM 8.7*  GLUCOSE 113*   No results found  for: CHOL, HDL, LDLCALC, TRIG No results found for: [facility]   Radiology Studies    Dg Chest 2 View  Result Date: 02/09/2017 CLINICAL DATA:  Lung surgery. EXAM: CHEST  2 VIEW COMPARISON:  02/08/2017 FINDINGS: Mediastinum stable. Heart size stable Postsurgical changes left lung. Interim partial clearing of left lower lobe atelectasis and infiltrates. Persistent low lung volumes. Sliding hiatal hernia. Stable elevation right hemidiaphragm . Diffuse osteopenia degenerative change thoracic spine. IMPRESSION: Postsurgical changes left lung. Partial clearing of left lower lobe atelectasis and infiltrate. Persistent low lung volumes. Electronically Signed   By: [name]  Register   On: 02/09/2017 08:22   Dg Chest 2 View  Result Date: 01/29/2017 CLINICAL DATA:  Left lung nodule EXAM: CHEST  2 VIEW COMPARISON:  PET CT 01/04/2017 FINDINGS: Again noted is the left upper lobe mass measuring 4.3 cm, similar to prior PET CT, likely enlarging since prior chest x-ray. Eventration of the right hemidiaphragm noted. No other focal airspace opacity. No effusion. Large hiatal hernia. Heart is normal size. IMPRESSION: Left upper lobe mass again noted. Electronically Signed   By: Rolm Baptise M.D.   On: 01/29/2017 09:39   Dg Chest Port 1 View  Result Date: 02/08/2017 CLINICAL DATA:  Shortness of breath. History of left upper lobectomy on February 01, 2017 EXAM:  PORTABLE CHEST 1 VIEW COMPARISON:  PET-CT study of January 14, 2017 and chest x-ray of February 07, 2017 FINDINGS: The lung volumes remain low with elevation of the right hemidiaphragm. There is persistent increased interstitial density in the mid and lower left lung and at the right lung base. There are probable posterior layering pleural effusions bilaterally. The cardiac silhouette remains enlarged. The central pulmonary vascularity is prominent. There surgical clips in the left axillary region. The observed bony thorax exhibits no acute abnormality. IMPRESSION: Persistent elevation of the right hemidiaphragm. Persistent bilateral interstitial and mild alveolar opacities compatible with pneumonia. Stable cardiomegaly without definite pulmonary edema. Electronically Signed   By: David  Martinique M.D.   On: 02/08/2017 07:19   Dg Chest Port 1 View  Result Date: 02/07/2017 CLINICAL DATA:  Atelectasis EXAM: PORTABLE CHEST 1 VIEW COMPARISON:  02/06/2017 FINDINGS: Diffuse left lung airspace disease and right basilar atelectasis or infiltrate again noted, not significantly changed. Small bilateral effusions suspected. Mild cardiomegaly. IMPRESSION: No significant change since prior study. Electronically Signed   By: Rolm Baptise M.D.   On: 02/07/2017 07:05   Dg Chest Port 1 View  Result Date: 02/06/2017 CLINICAL DATA:  Post lobectomy of lung EXAM: PORTABLE CHEST 1 VIEW COMPARISON:  02/05/2017 FINDINGS: Interval removal of right internal jugular central line. Very low lung volumes. Worsening diffuse airspace disease throughout the left lung and right basilar airspace disease. No visible pneumothorax. IMPRESSION: Worsening bilateral airspace disease, diffuse on the left and in the right lung base. Electronically Signed   By: Rolm Baptise M.D.   On: 02/06/2017 08:07   Dg Chest Port 1 View  Result Date: 02/05/2017 CLINICAL DATA:  Status post left upper lobectomy for lung mass. Atelectasis. EXAM: PORTABLE CHEST 1 VIEW  COMPARISON:  February 04, 2017 FINDINGS: Chest tube removed on the left. No evident pneumothorax. Central catheter tip is in superior vena cava. There is postoperative change on the left. The previously noted left pleural effusion is only minimally apparent currently. There is patchy atelectasis in both lung bases. There is a degree of mild underlying interstitial edema. Heart is upper normal in size. The pulmonary vascular is within normal limits. Note that  there is a degree of elevation of the right hemidiaphragm, stable. No evident adenopathy. There is degenerative change in each shoulder. IMPRESSION: No pneumothorax. Slight interstitial edema. Minimal left pleural effusion. Patchy bibasilar atelectasis. No airspace consolidation. Stable cardiac silhouette. Electronically Signed   By: Lowella Grip III M.D.   On: 02/05/2017 08:48   Dg Chest Port 1 View  Result Date: 02/04/2017 CLINICAL DATA:  Chest tube, post LEFT upper lobectomy EXAM: PORTABLE CHEST 1 VIEW COMPARISON:  Portable exam 0546 hours compared to 02/03/2015 FINDINGS: Interval removal of 1 of 2 LEFT thoracostomy tubes. RIGHT jugular central venous catheter with tip projecting over SVC. Rotation to the RIGHT. Enlargement of cardiac silhouette with slight vascular congestion. Decreased lung volumes with bibasilar atelectasis. Small LEFT pleural effusion at apex and base. No pneumothorax. IMPRESSION: Bibasilar atelectasis and small LEFT pleural effusion. No pneumothorax following removal of 1 of 2 LEFT thoracostomy tubes. Electronically Signed   By: Lavonia Dana M.D.   On: 02/04/2017 08:25   Dg Chest Port 1 View  Result Date: 02/03/2017 CLINICAL DATA:  Chest tube, post lobectomy, shortness of Breath EXAM: PORTABLE CHEST 1 VIEW COMPARISON:  02/02/2017 FINDINGS: Very low lung volumes. Two left chest tubes and right central line remain in place, unchanged. No pneumothorax. Cardiomegaly with bilateral airspace disease. No acute bony abnormality.  IMPRESSION: Cardiomegaly, diffuse bilateral airspace disease with very low lung volumes. No change since prior study. Electronically Signed   By: Rolm Baptise M.D.   On: 02/03/2017 08:32   Dg Chest Port 1 View  Result Date: 02/02/2017 CLINICAL DATA:  Status post left upper lobectomy yesterday EXAM: PORTABLE CHEST 1 VIEW COMPARISON:  Portable chest x-ray of February 01, 2017 FINDINGS: The tiny left apical pneumothorax is not clearly evident today. The 2 chest tubes remain present with their tips in the pulmonary apex on the left. The right lung remains hypoinflated. The interstitial markings of both lungs remain coarse. The cardiac silhouette is enlarged but indistinct. The pulmonary vascularity is indistinct. IMPRESSION: Persistent bilateral hypoinflation with interstitial edema or less likely pneumonia. Right basilar atelectasis is suspected. No large pleural effusion. The left-sided pneumothorax is not clearly visible today. Stable cardiomegaly with mild central pulmonary vascular prominence. Electronically Signed   By: David  Martinique M.D.   On: 02/02/2017 07:34   Dg Chest Port 1 View  Result Date: 02/01/2017 CLINICAL DATA:  Status post left upper lobectomy today EXAM: PORTABLE CHEST 1 VIEW COMPARISON:  PA and lateral chest x-ray of January 29, 2017 FINDINGS: The lung volumes are low. There is bibasilar atelectasis. There is a small left apical pneumothorax amounting to 5% or less of the lung volume. The 2 left-sided chest tubes have their tips in the left apex. There is subcutaneous emphysema in the left axillary region. The cardiac silhouette is mildly enlarged and indistinct. The pulmonary vascularity is mildly engorged. The right internal jugular venous catheter tip projects over the midportion of the SVC. There are surgical clips in the left hilar region. IMPRESSION: Mild hypoinflation greatest on the right. Bibasilar atelectasis or less likely infiltrate. Tiny left apical pneumothorax. There are 2 chest  tubes with their tips in the apex. Electronically Signed   By: David  Martinique M.D.   On: 02/01/2017 15:15   Dg Swallowing Func-speech Pathology  Result Date: 02/06/2017 Objective Swallowing Evaluation: Type of Study: Bedside Swallow Evaluation Patient Details Name: VARDAAN DEPASCALE MRN: [id number] Date of Birth: 28-May-1946 Today's Date: 02/06/2017 Time: SLP Start Time (ACUTE ONLY): 1400-SLP Stop Time (  ACUTE ONLY): 1430 SLP Time Calculation (min) (ACUTE ONLY): 30 min Past Medical History: Past Medical History: Diagnosis Date . Arthritis  . Asthma  . Atypical chest pain   a. Normal nuc 2014. . Carotid artery disease ([place])   a. Carotid duplex 2015: 02-54% RICA, 2-70% LICA.  Marland Kitchen Cataract  . Coronary artery calcification seen on CT scan  . Depression  . Detached retina  . Former consumption of alcohol  . Former tobacco use  . GERD (gastroesophageal reflux disease)   "I take heart burn medicine" . Glaucoma  . Hyperlipemia  . Hypertension  . Low back pain 12/29/2016 . Mass of upper lobe of left lung 12/29/2016 . PVD (peripheral vascular disease) ([place])   a. Mild plaque of iliacs in 2013 on duplex; PET 2018:  PET also corroborated coronary, aortic arch, and branch vessel atherosclerotic vascular disease as well as aortoiliac atherosclerotic vascular disease. . Sleep apnea   has not gotten CPAP yet Past Surgical History: Past Surgical History: Procedure Laterality Date . APPENDECTOMY   . COLONOSCOPY   . EYE SURGERY   . FLEXIBLE BRONCHOSCOPY N/A 02/01/2017  Procedure: FLEXIBLE BRONCHOSCOPY;  Surgeon: Gaye Pollack, MD;  Location: [facility] OR;  Service: Thoracic;  Laterality: N/A; . GANGLION CYST EXCISION    left hand . HERNIA REPAIR    double hernia repair . THORACOTOMY/LOBECTOMY Left 02/01/2017  Procedure: THORACOTOMY/LEFT UPPER LOBECTOMY;  Surgeon: Gaye Pollack, MD;  Location: [facility] OR;  Service: Thoracic;  Laterality: Left; . TUMOR REMOVAL    non cancer tumor from neck HPI: 71 year old male with PMH significant for CAD, OSA in process of  getting CPAP, and HTN. Undergoing back pain workup for spine surgery and a 4cm LUL lung mass was discovered. He then presented to [facility] 02/05 for elective left upper lobectomy. The procedure was successful without complication and he was extubated post-opertively, however, several hours later he developed lethargy and was minimally responsive. ABG was done and demonstrated severe respiratory acidosis and he was started on BiPAP. Currently on HFNC. CXR today showed Worsening bilateral airspace disease, diffuse on the left and in theright lung base. Subjective: upright in chair, RN present Assessment / Plan / Recommendation CHL IP CLINICAL IMPRESSIONS 02/06/2017 Therapy Diagnosis WFL Clinical Impression Patient presents with oropharyngeal swallow which appears grossly within functional limits. No aspiration observed. Noted intermittent pentration due to delayed swallow initiation at pyriform sinuses with thin liquids (transient) and with mixed consistency (thin liquid with barium tablet). Oral phase characterized by adequate mastication and anterior to posterior transit. Pharyngeal phase with adequate tongue base retraction, hyolaryngeal excursion and pharyngeal constriction. Epiglottic movement complete. No abnormal residual remains in the oral cavity or pharynx after the swallow. Recommend regular diet with thin liquids, medications whole in puree. SLP will f/u for tolerance, education regarding general aspiration precautions due to patient's noted impulsivity with liquids. Impact on safety and function Mild aspiration risk   CHL IP TREATMENT RECOMMENDATION 02/06/2017 Treatment Recommendations Therapy as outlined in treatment plan below   Prognosis 02/06/2017 Prognosis for Safe Diet Advancement Good Barriers to Reach Goals -- Barriers/Prognosis Comment -- CHL IP DIET RECOMMENDATION 02/06/2017 SLP Diet Recommendations Regular solids;Thin liquid Liquid Administration via Cup;Straw Medication Administration Whole meds  with puree Compensations Slow rate;Small sips/bites Postural Changes Seated upright at 90 degrees   CHL IP OTHER RECOMMENDATIONS 02/06/2017 Recommended Consults -- Oral Care Recommendations Oral care BID Other Recommendations --   CHL IP FOLLOW UP RECOMMENDATIONS 02/06/2017 Follow up Recommendations Other (comment)  CHL IP FREQUENCY AND DURATION 02/06/2017 Speech Therapy Frequency (ACUTE ONLY) min 1 x/week Treatment Duration 1 week      CHL IP ORAL PHASE 02/06/2017 Oral Phase WFL Oral - Pudding Teaspoon -- Oral - Pudding Cup -- Oral - Honey Teaspoon -- Oral - Honey Cup -- Oral - Nectar Teaspoon -- Oral - Nectar Cup -- Oral - Nectar Straw -- Oral - Thin Teaspoon -- Oral - Thin Cup -- Oral - Thin Straw -- Oral - Puree -- Oral - Mech Soft -- Oral - Regular -- Oral - Multi-Consistency -- Oral - Pill -- Oral Phase - Comment --  CHL IP PHARYNGEAL PHASE 02/06/2017 Pharyngeal Phase Impaired Pharyngeal- Pudding Teaspoon -- Pharyngeal -- Pharyngeal- Pudding Cup -- Pharyngeal -- Pharyngeal- Honey Teaspoon -- Pharyngeal -- Pharyngeal- Honey Cup -- Pharyngeal -- Pharyngeal- Nectar Teaspoon -- Pharyngeal -- Pharyngeal- Nectar Cup -- Pharyngeal -- Pharyngeal- Nectar Straw -- Pharyngeal -- Pharyngeal- Thin Teaspoon -- Pharyngeal -- Pharyngeal- Thin Cup -- Pharyngeal -- Pharyngeal- Thin Straw -- Pharyngeal -- Pharyngeal- Puree -- Pharyngeal -- Pharyngeal- Mechanical Soft -- Pharyngeal -- Pharyngeal- Regular -- Pharyngeal -- Pharyngeal- Multi-consistency -- Pharyngeal -- Pharyngeal- Pill -- Pharyngeal -- Pharyngeal Comment --  CHL IP CERVICAL ESOPHAGEAL PHASE 02/06/2017 Cervical Esophageal Phase WFL Pudding Teaspoon -- Pudding Cup -- Honey Teaspoon -- Honey Cup -- Nectar Teaspoon -- Nectar Cup -- Nectar Straw -- Thin Teaspoon -- Thin Cup -- Thin Straw -- Puree -- Mechanical Soft -- Regular -- Multi-consistency -- Pill -- Cervical Esophageal Comment -- Deneise Lever, MS CF-SLP Speech-Language Pathologist [phone] No flowsheet data  found. Aliene Altes 02/06/2017, 3:45 PM               EKG & Cardiac Imaging    EKG: Atrial fibrillation with RVR, HR 110.  Echocardiogram: 11/12/2014 Study Conclusions  - Left ventricle: The cavity size was normal. Wall thickness was increased in a pattern of mild LVH. Systolic function was normal. The estimated ejection fraction was in the range of 55% to 60%. Wall motion was normal; there were no regional wall motion abnormalities. Doppler parameters are consistent with abnormal left ventricular relaxation (grade 1 diastolic dysfunction). - Aortic valve: There was no stenosis. - Mitral valve: There was trivial regurgitation. - Left atrium: The atrium was mildly dilated. - Right ventricle: The cavity size was normal. Systolic function was normal. - Tricuspid valve: Peak RV-RA gradient (S): 24 mm Hg. - Pulmonary arteries: PA peak pressure: 27 mm Hg (S). - Inferior vena cava: The vessel was normal in size. The respirophasic diameter changes were in the normal range (>= 50%), consistent with normal central venous pressure.  Impressions:  - Normal LV size with mild LV hypertrophy. EF 55-60%. Normal RV size and systolic function. No significant valvular abnormalities.  Assessment & Plan    1. New Onset Atrial Fibrillation - admitted with thoracotomy with left upper lobectomy and pathology showing squamous cell carcinoma. Post-op course complicated by hypercarbic respiratory failure and AKI.  - noted to have gone from NSR to atrial fibrillation on 02/09/2017. He was asymptomatic with this. IV Amiodarone was started initially but later discontinued due to the risk of Pulmonary toxicity. Started on Lopressor 34m BID with HR currently in the 120's - 140's. Telemetry seems most consistent with atrial flutter. Will obtain repeat EKG.  - Agree with increasing Lopressor from 214mBID to 5047mID. Will start short-acting PO Cardizem 59m19mH.  - TSH 0.959. Mg 2.0. K+  4.7. - This patients CHA2DS2-VASc  Score and unadjusted Ischemic Stroke Rate (% per year) is equal to 3.2 % stroke rate/year from a score of 3 (HTN, PVD, Age). Would recommend initiation of Eliquis 62m BID. The patient would like to discuss this with Dr. BCyndia Bentprior to initiation.   2. HTN - BP at 71/48 - 139/100 in the past 24 hours. - medication changes as above.   3. HLD - on Atorvastatin 467mdaily as an outpatient.   4. Squamous cell lung cancer - s/p bronchoscopy and thoracotomy with left upper lobectomy on 02/01/2017. - per CT Surgery.   Signed, BrErma HeritagePA-C 02/10/2017, 1:12 PM Pager: [phone] have examined the patient and reviewed assessment and plan and discussed with patient.  Agree with above as stated.  Patent with some AFib, predominantly flutter on tele.  Increase metoprolol and start low dose diltiazem for BP control and rate slowing.    Would recommend Eliquis for stroke prevention when ok from a surgical standpoint.  He would like to discuss this with Dr. BaCyndia Bentefore agreeing to blood thinner.  He may be a candidate for a flutter ablation but this may not be useful since he has some AFib as well.  Will follow.    JaLarae Grooms

## 2017-02-10 NOTE — Progress Notes (Signed)
02/10/2017- Pt placed on cpap machine and tolerating well.

## 2017-02-10 NOTE — Progress Notes (Addendum)
Patient B.P. Has been low today see V.S. Flow sheet . Skin warm and dry with no complaints. Cont. To monitor V.S.  R.N. Aware B.P. [phone]

## 2017-02-11 DIAGNOSIS — I481 Persistent atrial fibrillation: Secondary | ICD-10-CM

## 2017-02-11 LAB — URINE CULTURE

## 2017-02-11 NOTE — Clinical Social Work Note (Signed)
Clinical Social Work Assessment  Patient Details  Name: Paul Valdez MRN: [id number] Date of Birth: 1946-11-25  Date of referral:  02/11/17               Reason for consult:  Discharge Planning                Permission sought to share information with:  Family Supports Permission granted to share information::  Yes, Verbal Permission Granted  Name::     Deretha Emory  Agency::     Relationship::  friend  Contact Information:  [phone]  Housing/Transportation Living arrangements for the past 2 months:  Single Family Home Source of Information:  Patient Patient Interpreter Needed:  None Criminal Activity/Legal Involvement Pertinent to Current Situation/Hospitalization:  No - Comment as needed Significant Relationships:  Friend Lives with:  Self Do you feel safe going back to the place where you live?  Yes Need for family participation in patient care:  Yes (Comment)  Care giving concerns:  Patient has support    Facilities manager / plan: Clinical Social Worker met patient at bedside to offer support and discuss patients needs at discharge. Patient stated he is agreeable to go to a SNF placement. Patient was given a list of facility within  25 mile radius of the hospital. Patient stated he will look over facility with his friend and let CSW know which facility was chosen. CSW to complete necessary paperwork and initiate SNF search on patients behalf. CSW to follow up with patient once bed offers are available.   Employment status:  Retired Forensic scientist:  Medicare PT Recommendations:  [place] / Referral to community resources:  [place]  Patient/Family's Response to care:  Patient verbalized appreciation for CSW role and involvement in care. Patent agreeable with current discharge plan to SNF   Patient/Family's Understanding of and Emotional Response to Diagnosis, Current Treatment, and Prognosis:  Patient with good  understanding of current medical state and limitations around most recent hospitalization. Patient agreeable to SNF placement on hopes of being able to transitions back home  Emotional Assessment Appearance:  Appears stated age Attitude/Demeanor/Rapport:  Other Affect (typically observed):  Calm, Happy, Pleasant Orientation:  Oriented to Self, Oriented to Situation, Oriented to Place, Oriented to  Time Alcohol / Substance use:  Not Applicable Psych involvement (Current and /or in the community):  No (Comment)  Discharge Needs  Concerns to be addressed:  No discharge needs identified Readmission within the last 30 days:  No Current discharge risk:  None Barriers to Discharge:  No Barriers Identified   Wende Neighbors, LCSW 02/11/2017, 5:25 PM

## 2017-02-11 NOTE — Progress Notes (Signed)
Pt has intermittent confusion. He stated that he understands what's going on but doesn't fully comprehend it. He would like to speak with his doctor with his friend [name] present. He refuses his Bipap. I educated pt on the importance of it and he still refuses. He prefers his O2.

## 2017-02-11 NOTE — Discharge Instructions (Addendum)
Video-Assisted Thoracic Surgery, Care After Refer to this sheet in the next few weeks. These instructions provide you with information on caring for yourself after your procedure. Your caregiver may also give you more specific instructions. Your procedure has been planned according to current medical practices, but problems sometimes occur. Call your caregiver if you have any problems or questions after your procedure. HOME CARE INSTRUCTIONS   Only take over-the-counter or prescription medications as directed.  Only take pain medications (narcotics) as directed.  Do not drive until your caregiver approves. Driving while taking narcotics or soon after surgery can be dangerous, so discuss the specific timing with your caregiver.  Avoid activities that use your chest muscles, such as lifting heavy objects, for at least 3-4 weeks.   Take deep breaths to expand the lungs and to protect against pneumonia.  Do breathing exercises as directed by your caregiver. If you were given an incentive spirometer to help with breathing, use it as directed.  You may resume a normal diet and activities when you feel you are able to or as directed.  Do not take a bath until your caregiver says it is OK. Use the shower instead.   Keep the bandage (dressing) covering the area where the chest tube was inserted (incision site) dry for 48 hours. After 48 hours, remove the dressing unless there is new drainage.  Remove dressings as directed by your caregiver.  Change dressings if necessary or as directed.  Keep all follow-up appointments. It is important for you to see your caregiver after surgery to discuss appropriate follow-up care and surveillance, if it is necessary. SEEK MEDICAL CARE:  You feel excessive or increasing pain at an incision site.  You notice bleeding, skin irritation, drainage, swelling, or redness at an incision site.  There is a bad smell coming from an incision or dressing.  It feels  like your heart is fluttering or beating rapidly.  Your pain medication does not relieve your pain. SEEK IMMEDIATE MEDICAL CARE IF:   You have a fever.   You have chest pain.  You have a rash.  You have shortness of breath.  You have trouble breathing.   You feel weak, lightheaded, dizzy, or faint.  MAKE SURE YOU:   Understand these instructions.   Will watch your condition.   Will get help right away if you are not doing well or get worse. This information is not intended to replace advice given to you by your health care provider. Make sure you discuss any questions you have with your health care provider. Document Released: 04/10/2013 Document Revised: 01/04/2015 Document Reviewed: 04/10/2013 Elsevier Interactive Patient Education  2017 [place] on my medicine - ELIQUIS (apixaban)  This medication education was reviewed with me or my healthcare representative as part of my discharge preparation.  The pharmacist that spoke with me during my hospital stay was:  Saundra Shelling, [facility]  Why was Eliquis prescribed for you? Eliquis was prescribed for you to reduce the risk of a blood clot forming that can cause a stroke if you have a medical condition called atrial fibrillation (a type of irregular heartbeat).  What do You need to know about Eliquis ? Take your Eliquis TWICE DAILY - one tablet in the morning and one tablet in the evening with or without food. If you have difficulty swallowing the tablet whole please discuss with your pharmacist how to take the medication safely.  Take Eliquis exactly as prescribed by your doctor and  DO NOT stop taking Eliquis without talking to the doctor who prescribed the medication.  Stopping may increase your risk of developing a stroke.  Refill your prescription before you run out.  After discharge, you should have regular check-up appointments with your healthcare provider that is prescribing your Eliquis.  In  the future your dose may need to be changed if your kidney function or weight changes by a significant amount or as you get older.  What do you do if you miss a dose? If you miss a dose, take it as soon as you remember on the same day and resume taking twice daily.  Do not take more than one dose of ELIQUIS at the same time to make up a missed dose.  Important Safety Information A possible side effect of Eliquis is bleeding. You should call your healthcare provider right away if you experience any of the following: ? Bleeding from an injury or your nose that does not stop. ? Unusual colored urine (red or dark brown) or unusual colored stools (red or black). ? Unusual bruising for unknown reasons. ? A serious fall or if you hit your head (even if there is no bleeding).  Some medicines may interact with Eliquis and might increase your risk of bleeding or clotting while on Eliquis. To help avoid this, consult your healthcare provider or pharmacist prior to using any new prescription or non-prescription medications, including herbals, vitamins, non-steroidal anti-inflammatory drugs (NSAIDs) and supplements.  This website has more information on Eliquis (apixaban): http://www.eliquis.com/eliquis/home

## 2017-02-11 NOTE — Progress Notes (Addendum)
[address]       [place] [id number]             [phone]      10 Days Post-Op Procedure(s) (LRB): FLEXIBLE BRONCHOSCOPY (N/A) THORACOTOMY/LEFT UPPER LOBECTOMY (Left) Subjective: Feels ok  Objective: Vital signs in last 24 hours: Temp:  [97.7 F (36.5 C)-99.1 F (37.3 C)] 97.8 F (36.6 C) (02/15 0541) Pulse Rate:  [74-130] 91 (02/15 0541) Cardiac Rhythm: Atrial fibrillation (02/14 2024) Resp:  [18] 18 (02/14 1315) BP: (82-139)/(58-100) 90/62 (02/15 0541) SpO2:  [91 %-99 %] 99 % (02/15 0541)  Hemodynamic parameters for last 24 hours:    Intake/Output from previous day: 02/14 0701 - 02/15 0700 In: 960 [P.O.:960] Out: 1223 [Urine:1223] Intake/Output this shift: No intake/output data recorded.  General appearance: alert, cooperative and no distress Heart: irregularly irregular rhythm Lungs: clear to auscultation bilaterally Abdomen: benign Extremities: no edema Wound: incis healing well  Lab Results:  Recent Labs  02/09/17 1216 02/10/17 0449  WBC 12.6* 11.3*  HGB 11.6* 11.0*  HCT 36.5* 34.8*  PLT 318 271   BMET:  Recent Labs  02/09/17 1216 02/10/17 0449  NA 135 136  K 4.4 4.7  CL 98* 99*  CO2 27 29  GLUCOSE 104* 113*  BUN 51* 50*  CREATININE 1.89* 1.38*  CALCIUM 9.1 8.7*    PT/INR: No results for input(s): LABPROT, INR in the last 72 hours. ABG    Component Value Date/Time   PHART 7.409 02/05/2017 0336   HCO3 29.9 (H) 02/05/2017 0336   TCO2 27 02/03/2017 1108   ACIDBASEDEF 2.0 02/03/2017 1108   O2SAT 98.7 02/05/2017 0336   CBG (last 3)  No results for input(s): GLUCAP in the last 72 hours.  Meds Scheduled Meds: . apixaban  5 mg Oral BID  . chlorhexidine  15 mL Mouth Rinse BID  . diltiazem  30 mg Oral Q6H  . gabapentin  600 mg Oral TID  . mouth rinse  15 mL Mouth Rinse q12n4p  . metoprolol tartrate  50 mg Oral BID  . phenazopyridine  100 mg Oral TID WC  . senna-docusate  1 tablet Oral QHS  . sertraline   100 mg Oral Daily  . sodium chloride flush  3 mL Intracatheter Q12H  . tamsulosin  0.4 mg Oral QPC supper   Continuous Infusions: PRN Meds:.alum & mag hydroxide-simeth, levalbuterol, metoprolol, sodium chloride flush, traMADol  Xrays Dg Chest 2 View  Result Date: 02/09/2017 CLINICAL DATA:  Lung surgery. EXAM: CHEST  2 VIEW COMPARISON:  02/08/2017 FINDINGS: Mediastinum stable. Heart size stable Postsurgical changes left lung. Interim partial clearing of left lower lobe atelectasis and infiltrates. Persistent low lung volumes. Sliding hiatal hernia. Stable elevation right hemidiaphragm . Diffuse osteopenia degenerative change thoracic spine. IMPRESSION: Postsurgical changes left lung. Partial clearing of left lower lobe atelectasis and infiltrate. Persistent low lung volumes. Electronically Signed   By: [name]  Register   On: 02/09/2017 08:22    Assessment/Plan: S/P Procedure(s) (LRB): FLEXIBLE BRONCHOSCOPY (N/A) THORACOTOMY/LEFT UPPER LOBECTOMY (Left) 1 doing well 2 afib- cont current rx- now on Eliquis 3 poss home soon, will see if he qualifies for home O2 4 need to possibly adjust BP meds further as low at times   LOS: 10 days    Paul Valdez,Paul Valdez 02/11/2017   Chart reviewed, patient examined, agree with above. His BP has been labile and was in the 80's overnight but 128/71 this am. HR controlled in atrial fib/flutter on  cardizem and lopressor. Will see how BP runs today.   He has condom cath on this am. Still complains of painful urination but better. UA negative, culture pending.   His friend [name] is here today and has concerns about him going home. Patient has no one to stay with him. He has friends who can check in on him but no one who can be there with him for very long. I agree that it would not be safe for him to go home in that situation. He has a bad back and some difficulty with standing and getting moving at times, marginal lung function. I think a SNF would be best for him  in the short term until he gets stronger. Will ask social work to see him.

## 2017-02-11 NOTE — Progress Notes (Signed)
Physical Therapy Treatment Patient Details Name: Paul Valdez MRN: [id number] DOB: 10/27/1946 Today's Date: 02/11/2017    History of Present Illness 71 yo admitted for LUL lobectomy after mass found on scan in prep for back surgery. Pt with respiratory acidosis and lethargy post op. PMHx: back pain, CAD, oA, HTN    PT Comments    Pt pleasant and demonstrates decreased safety and memory today not mobilizing as well as last visit with this therapist 2/12. Pt with continued improvement and decline in function this admission with fall with assist of friend during admission and lack of 24 hr care. Given inconsistent progression and lack of assist returning to D/C plan for SNF is appropriate. Pt educated for need to maintain O2 and encouraged to continue HEP. Will continue to follow.  Sats 88% after 50' on RA with sats 92% on 2L with gait, 93% at rest on RA HR 98  Follow Up Recommendations  SNF;Supervision for mobility/OOB     Equipment Recommendations  Rolling walker with 5" wheels;3in1 (PT)    Recommendations for Other Services       Precautions / Restrictions Precautions Precautions: Fall Restrictions Weight Bearing Restrictions: No    Mobility  Bed Mobility               General bed mobility comments: EOB on arrival  Transfers Overall transfer level: Needs assistance   Transfers: Sit to/from Stand Sit to Stand: Supervision         General transfer comment: cues for hand placement  Ambulation/Gait Ambulation/Gait assistance: Min guard Ambulation Distance (Feet): 300 Feet Assistive device: Rolling walker (2 wheeled) Gait Pattern/deviations: Step-through pattern;Decreased stride length;Trunk flexed   Gait velocity interpretation: Below normal speed for age/gender General Gait Details: Verbal cues to stand upright during gait, and keep feet inside RW   Stairs            Wheelchair Mobility    Modified Rankin (Stroke Patients Only)        Balance Overall balance assessment: Needs assistance   Sitting balance-Leahy Scale: Good       Standing balance-Leahy Scale: Fair                      Cognition Arousal/Alertness: Awake/alert Behavior During Therapy: WFL for tasks assessed/performed Overall Cognitive Status: Impaired/Different from baseline       Memory: Decreased short-term memory   Safety/Judgement: Decreased awareness of safety     General Comments: pt oriented and able to recall events since admission, did stumble over month today and demonstrates decreased safety awareness    Exercises General Exercises - Lower Extremity Long Arc Quad: AROM;Both;Seated;15 reps Hip Flexion/Marching: AROM;15 reps;Both;Seated    General Comments        Pertinent Vitals/Pain Pain Assessment: No/denies pain    Home Living                      Prior Function            PT Goals (current goals can now be found in the care plan section) Progress towards PT goals: Progressing toward goals    Frequency           PT Plan Discharge plan needs to be updated    Co-evaluation             End of Session Equipment Utilized During Treatment: Gait belt;Oxygen Activity Tolerance: Patient tolerated treatment well Patient left: in chair;with call bell/phone within reach;with family/visitor  present;with chair alarm set     Time: [id number] PT Time Calculation (min) (ACUTE ONLY): 32 min  Charges:  $Gait Training: 8-22 mins $Therapeutic Exercise: 8-22 mins                    G Codes:      Taeshaun Rames B Ghada Abbett March 13, 2017, 11:55 AM  Elwyn Reach, [place]

## 2017-02-11 NOTE — Progress Notes (Signed)
Progress Note  Patient Name: Paul Valdez Date of Encounter: 02/11/2017  Primary Cardiologist: Irish Lack  Subjective   Felt CPAP was not working well.  Had some borderline BP so Cardizem was held.  Inpatient Medications    Scheduled Meds: . apixaban  5 mg Oral BID  . chlorhexidine  15 mL Mouth Rinse BID  . diltiazem  30 mg Oral Q6H  . gabapentin  600 mg Oral TID  . mouth rinse  15 mL Mouth Rinse q12n4p  . metoprolol tartrate  50 mg Oral BID  . phenazopyridine  100 mg Oral TID WC  . senna-docusate  1 tablet Oral QHS  . sertraline  100 mg Oral Daily  . sodium chloride flush  3 mL Intracatheter Q12H  . tamsulosin  0.4 mg Oral QPC supper   Continuous Infusions:  PRN Meds: alum & mag hydroxide-simeth, levalbuterol, metoprolol, sodium chloride flush, traMADol   Vital Signs    Vitals:   02/10/17 2138 02/10/17 2342 02/11/17 0541 02/11/17 1045  BP: 98/60 (!) 82/58 90/62 128/71  Pulse: 81 89 91   Resp:      Temp: 99.1 F (37.3 C)  97.8 F (36.6 C)   TempSrc: Oral  Oral   SpO2: 91%  99% 93%  Weight:      Height:        Intake/Output Summary (Last 24 hours) at 02/11/17 1117 Last data filed at 02/11/17 0830  Gross per 24 hour  Intake             1080 ml  Output              823 ml  Net              257 ml   Filed Weights   02/06/17 0630 02/07/17 0700 02/08/17 0400  Weight: 198 lb 3.1 oz (89.9 kg) 202 lb 6.1 oz (91.8 kg) 203 lb 7.8 oz (92.3 kg)    Telemetry    AFib - Personally Reviewed  ECG    Atrial flutter- rate controlled - Personally Reviewed  Physical Exam   GEN: No acute distress.   Neck: No JVD Cardiac: irregularly irregular.  Respiratory: Clear to auscultation bilaterally. GI: Soft, nontender, non-distended  MS: No edema; No deformity. Neuro:  Nonfocal  Psych: Normal affect   Labs    Chemistry Recent Labs Lab 02/08/17 0320 02/09/17 1216 02/10/17 0449  NA 136 135 136  K 3.7 4.4 4.7  CL 98* 98* 99*  CO2 '29 27 29  '$ GLUCOSE 108*  104* 113*  BUN 31* 51* 50*  CREATININE 0.85 1.89* 1.38*  CALCIUM 9.0 9.1 8.7*  GFRNONAA >60 34* 50*  GFRAA >60 39* 58*  ANIONGAP '9 10 8     '$ Hematology Recent Labs Lab 02/08/17 0320 02/09/17 1216 02/10/17 0449  WBC 8.2 12.6* 11.3*  RBC 3.83* 4.00* 3.83*  HGB 11.0* 11.6* 11.0*  HCT 34.7* 36.5* 34.8*  MCV 90.6 91.3 90.9  MCH 28.7 29.0 28.7  MCHC 31.7 31.8 31.6  RDW 14.3 14.5 14.7  PLT 222 318 271    Cardiac EnzymesNo results for input(s): TROPONINI in the last 168 hours. No results for input(s): TROPIPOC in the last 168 hours.   BNPNo results for input(s): BNP, PROBNP in the last 168 hours.   DDimer No results for input(s): DDIMER in the last 168 hours.   Radiology    No results found.  Cardiac Studies   Normal LV function in 2015  Patient Profile  71 y.o. male atrial fibrillation  Assessment & Plan    AFib/Atrial flutter: Rate control with beta blocker.  Will have to watch BP carefully as he has had low readings. He has had very high readings in the past.   He will need a repeat echo at some point in the future given the arrhythmia.  He has some pain at his incision so now may not be the best time.   HTN: As above  He is agreeable to anticoagulation.    Signed, Larae Grooms, MD  02/11/2017, 11:17 AM

## 2017-02-11 NOTE — Progress Notes (Signed)
SATURATION QUALIFICATIONS: (This note is used to comply with regulatory documentation for home oxygen)  Patient Saturations on Room Air at Rest = 93%  Patient Saturations on Room Air while Ambulating = 88%  Patient Saturations on *2 Liters of oxygen while Ambulating = 92%  Please briefly explain why patient needs home oxygen:pt desaturating with activity on RA and requires supplemental oxygen Elwyn Reach, [place]

## 2017-02-11 NOTE — Care Management Note (Signed)
Case Management Note Marvetta Gibbons RN, BSN Unit 2W-Case Manager [phone]  Patient Details  Name: Paul Valdez MRN: [id number] Date of Birth: August 06, 1946  Subjective/Objective:  Pt s/p thoracotomy with lobectomy                   Action/Plan: PTA pt lived at home alone, spoke with pt at bedside regarding d/c needs and recommendations- per pt he has friends that can help him but no one that can stay with him at discharge- pt interested in doing STSNF for rehab- PT updated recommendation for SNF. Pt will also need 02 at discharge- discussed STSNF with pt and pt is agreeable to SNF- will consult CSW for placement needs. Orders for home 02 and HHRN/PT on hold has current plan for SNF.   Expected Discharge Date:                  Expected Discharge Plan:  [place]  In-House Referral:  Clinical Social Work  Discharge planning Services  CM Consult  Post Acute Care Choice:  Home Health, Durable Medical Equipment Choice offered to:  Patient  DME Arranged:  Oxygen DME Agency:  [place]:  RN, PT [facility] Agency:     Status of Service:  Completed, signed off  If discussed at [place] of Stay Meetings, dates discussed:    Additional Comments:  [name], RN 02/11/2017, 2:58 PM

## 2017-02-11 NOTE — Clinical Social Work Placement (Signed)
   CLINICAL SOCIAL WORK PLACEMENT  NOTE  Date:  02/11/2017  Patient Details  Name: Paul Valdez MRN: [id number] Date of Birth: [birth year]  Clinical Social Work is seeking post-discharge placement for this patient at the [place] level of care (*CSW will initial, date and re-position this form in  chart as items are completed):      Patient/family provided with [place] Work Department's list of facilities offering this level of care within the geographic area requested by the patient (or if unable, by the patient's family).  Yes   Patient/family informed of their freedom to choose among providers that offer the needed level of care, that participate in Medicare, Medicaid or managed care program needed by the patient, have an available bed and are willing to accept the patient.  Yes   Patient/family informed of [facility]'s ownership interest in [facility] and [facility], as well as of the fact that they are under no obligation to receive care at these facilities.  PASRR submitted to EDS on       PASRR number received on       Existing PASRR number confirmed on       FL2 transmitted to all facilities in geographic area requested by pt/family on       FL2 transmitted to all facilities within larger geographic area on       Patient informed that his/her managed care company has contracts with or will negotiate with certain facilities, including the following:            Patient/family informed of bed offers received.  Patient chooses bed at       Physician recommends and patient chooses bed at      Patient to be transferred to   on  .  Patient to be transferred to facility by       Patient family notified on   of transfer.  Name of family member notified:        PHYSICIAN Please sign FL2     Additional Comment:    _______________________________________________ Wende Neighbors, LCSW 02/11/2017, 5:44 PM

## 2017-02-12 DIAGNOSIS — I4819 Other persistent atrial fibrillation: Secondary | ICD-10-CM

## 2017-02-12 DIAGNOSIS — I10 Essential (primary) hypertension: Secondary | ICD-10-CM

## 2017-02-12 MED ORDER — DILTIAZEM HCL ER COATED BEADS 120 MG PO CP24
120.0000 mg | ORAL_CAPSULE | Freq: Every day | ORAL | Status: DC
  Administered 2017-02-12: 120 mg via ORAL
  Filled 2017-02-12: qty 1

## 2017-02-12 MED ORDER — PHENAZOPYRIDINE HCL 100 MG PO TABS: 100.0000 mg | ORAL_TABLET | Freq: Three times a day (TID) | ORAL | 0 refills | Status: DC

## 2017-02-12 MED ORDER — TAMSULOSIN HCL 0.4 MG PO CAPS: 0.4000 mg | ORAL_CAPSULE | Freq: Every day | ORAL | Status: DC

## 2017-02-12 MED ORDER — TRAMADOL HCL 50 MG PO TABS: 50.0000 mg | ORAL_TABLET | Freq: Four times a day (QID) | ORAL | 0 refills | Status: DC | PRN

## 2017-02-12 MED ORDER — DILTIAZEM HCL ER COATED BEADS 120 MG PO CP24: 120.0000 mg | ORAL_CAPSULE | Freq: Every day | ORAL | Status: DC

## 2017-02-12 MED ORDER — APIXABAN 5 MG PO TABS: 5.0000 mg | ORAL_TABLET | Freq: Two times a day (BID) | ORAL | Status: DC

## 2017-02-12 NOTE — Progress Notes (Signed)
Clinical Social Worker met with patein and friend at bedside to offer support and discuss SNF placement. Patient has decided on [facility]. Admissions Coordinator for [facility] was made aware of patients choice and stated they are able to take him today. CSW remains available for support.  Rhea Pink, MSW,  [place]

## 2017-02-12 NOTE — NC FL2 (Signed)
[place] LEVEL OF CARE SCREENING TOOL     IDENTIFICATION  Patient Name: Paul Valdez Birthdate: 1946-04-25 Sex: male Admission Date (Current Location): 02/01/2017  [facility] and [place] Number:  Herbalist and Address:  The . [facility], [place] [address] [place], [place] [address]      Provider Number: [id number]  Attending Physician Name and Address:  Gaye Pollack, MD  Relative Name and Phone Number:  Deretha Emory, [phone]    Current Level of Care: Hospital Recommended Level of Care: [place] Prior Approval Number:    Date Approved/Denied:   PASRR Number: [id number]  Discharge Plan: SNF    Current Diagnoses: Patient Active Problem List   Diagnosis Date Noted  . Atelectasis   . OSA (obstructive sleep apnea)   . S/P lobectomy of lung 02/01/2017  . Coronary artery calcification seen on CT scan 01/13/2017  . PVD (peripheral vascular disease) ([place]) 01/13/2017  . Essential hypertension 01/13/2017  . Hyperlipidemia 01/13/2017  . Nodule of left lung 12/29/2016  . Low back pain 12/29/2016  . Carotid artery disease ([place]) 11/21/2014    Orientation RESPIRATION BLADDER Height & Weight     Self, Situation, Place  O2 (3L) Continent Weight: 203 lb 7.8 oz (92.3 kg) Height:  '5\' 10"'$  (177.8 cm)  BEHAVIORAL SYMPTOMS/MOOD NEUROLOGICAL BOWEL NUTRITION STATUS      Continent    AMBULATORY STATUS COMMUNICATION OF NEEDS Skin   Limited Assist Verbally Normal                       Personal Care Assistance Level of Assistance  Bathing, Feeding, Dressing Bathing Assistance: Limited assistance Feeding assistance: Independent Dressing Assistance: Limited assistance     Functional Limitations Info  Sight, Hearing, Speech Sight Info: Adequate Hearing Info: Adequate Speech Info: Adequate    SPECIAL CARE FACTORS FREQUENCY  PT (By licensed PT), OT (By licensed OT)     PT Frequency: 5x week OT Frequency: 5x  week            Contractures Contractures Info: Not present    Additional Factors Info  Code Status, Allergies Code Status Info: Full Code Allergies Info: Colchicine           Current Medications (02/12/2017):  This is the current hospital active medication list Current Facility-Administered Medications  Medication Dose Route Frequency Provider Last Rate Last Dose  . alum & mag hydroxide-simeth (MAALOX/MYLANTA) 200-200-20 MG/5ML suspension 30 mL  30 mL Oral PRN Wayne E Gold, PA-C      . apixaban (ELIQUIS) tablet 5 mg  5 mg Oral BID Gaye Pollack, MD   5 mg at 02/12/17 5277  . chlorhexidine (PERIDEX) 0.12 % solution 15 mL  15 mL Mouth Rinse BID Gaye Pollack, MD   15 mL at 02/12/17 0824  . diltiazem (CARDIZEM) tablet 30 mg  30 mg Oral Q6H Erma Heritage, [place]   30 mg at 02/12/17 8242  . gabapentin (NEURONTIN) capsule 600 mg  600 mg Oral TID Collene Gobble, MD   600 mg at 02/12/17 3536  . levalbuterol (XOPENEX) nebulizer solution 0.63 mg  0.63 mg Nebulization Q6H PRN Wayne E Gold, PA-C      . MEDLINE mouth rinse  15 mL Mouth Rinse q12n4p Gaye Pollack, MD   15 mL at 02/11/17 1600  . metoprolol (LOPRESSOR) injection 2.5 mg  2.5 mg Intravenous Q4H PRN Melrose Nakayama, MD      .  phenazopyridine (PYRIDIUM) tablet 100 mg  100 mg Oral TID WC Gaye Pollack, MD   100 mg at 02/12/17 0639  . senna-docusate (Senokot-S) tablet 1 tablet  1 tablet Oral QHS Gaye Pollack, MD   1 tablet at 02/10/17 2142  . sertraline (ZOLOFT) tablet 100 mg  100 mg Oral Daily Collene Gobble, MD   100 mg at 02/12/17 7331  . sodium chloride flush (NS) 0.9 % injection 3 mL  3 mL Intracatheter Q12H Gaye Pollack, MD   3 mL at 02/12/17 0824  . sodium chloride flush (NS) 0.9 % injection 3 mL  3 mL Intracatheter PRN Gaye Pollack, MD      . tamsulosin (FLOMAX) capsule 0.4 mg  0.4 mg Oral QPC supper Gaye Pollack, MD   0.4 mg at 02/11/17 1704  . traMADol (ULTRAM) tablet 50-100 mg  50-100 mg Oral Q6H PRN Gaye Pollack, MD   50 mg at 02/11/17 2257     Discharge Medications: Please see discharge summary for a list of discharge medications.  Relevant Imaging Results:  Relevant Lab Results:   Additional Information [id number]  Wende Neighbors, LCSW

## 2017-02-12 NOTE — Progress Notes (Signed)
Clinical Social Worker facilitated patient discharge including contacting patient family and facility to confirm patient discharge plans.  Clinical information faxed to facility and family agreeable with plan.  CSW arranged ambulance transport via [facility] to [facility] .  RN Meredeth Ide to call [phone] report prior to discharge.  Clinical Social Worker will sign off for now as social work intervention is no longer needed. Please consult [place] again if new need arises.  Rhea Pink, MSW, [place]

## 2017-02-12 NOTE — Care Management Important Message (Signed)
Important Message  Patient Details  Name: Paul Valdez MRN: [id number] Date of Birth: 11/06/1946   Medicare Important Message Given:  Yes    [name] May 02/12/2017, 12:14 PM

## 2017-02-12 NOTE — Discharge Summary (Signed)
Physician Discharge Summary  Patient ID: Paul Valdez MRN: [id number] DOB/AGE: [birth year] 71 y.o.  Admit date: 02/01/2017 Discharge date: 02/12/2017  Admission Diagnoses: Left lung lesion  Discharge Diagnoses:  Active Problems:   S/P lobectomy of lung   Atelectasis   OSA (obstructive sleep apnea)   Atrial fibrillation [facility])  Patient Active Problem List   Diagnosis Date Noted  . Atrial fibrillation ([place]) 02/12/2017  . Atelectasis   . OSA (obstructive sleep apnea)   . S/P lobectomy of lung 02/01/2017  . Coronary artery calcification seen on CT scan 01/13/2017  . PVD (peripheral vascular disease) ([place]) 01/13/2017  . Essential hypertension 01/13/2017  . Hyperlipidemia 01/13/2017  . Nodule of left lung 12/29/2016  . Low back pain 12/29/2016  . Carotid artery disease ([place]) 11/21/2014    HPI:  The patient is a 71 year old previous heavy smoker with hypertension, hyperlipidemia, osteoarthritis, OSA and depression who has been having low back pain for two years and had an MRI in October 2017 showing lumbar spine disease. He was being worked up for spine surgery and a CXR showed a 4 cm LUL lung mass. CT of the chest on 12/11/2016 showed a 4.3 x 3.4 x 3.8 cm mass suspicious for lung cancer. There was a large right diaphragmatic eventration and right posterior diaphragmatic hernia. There was a large hiatal hernia with sliding and paraesophageal components as well as coronary and aortic atherosclerosis. He reports some shortness of breath at rest that is increased with exertion. He feels like this is because he has not been very active due to his back pain. He denies any cough or sputum production. He has been eating well and has had no weight loss.  He is single and has no children. His friend is with him today. He is retired. He reports a 30 pk-year smoking history but quit in 2000. 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    Pathology:athologyFINAL  DIAGNOSIS Diagnosis 1. 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 - CARCINOMA INVOLVES THE VISCERAL PLEURA. - THE SURGICAL RESECTION MARGINS ARE NEGATIVE FOR CARCINOMA. - THERE IS NO EVIDENCE OF CARCINOMA IN 4 OF 4 HILAR LYMPH NODES (0/4). - SEE ONCOLOGY TABLE BELOW. 2. Lymph node, biopsy, 11L - THERE IS NO EVIDENCE OF CARCINOMA IN 1 OF 1 LYMPH NODE (0/1). Microscopic Comment 1. LUNG Specimen, including laterality: Left upper lobe Procedure: Lobectomy Specimen integrity (intact/disrupted): Pleura is ulcerated Tumor site: Left upper lobe, subpleural Tumor focality: Unifocal Maximum tumor size (cm): 4.0 cm (gross measurement) Histologic type: Squamous cell carcinoma Grade: Poorly differentiated Margins: Negative for carcinoma Distance to closest margin (cm): 5.0 cm to the vascular and bronchial margins Visceral pleura invasion: Present Tumor extension: Confined to lung parenchyma Treatment effect (if treated with neoadjuvant therapy): N/A Lymph -Vascular invasion: Not identified Lymph nodes: Number examined - 5; Number N1 nodes positive 0; Number N2 nodes positive 0 TNM code: pT2a, pN0 Ancillary studies: Can be performed upon clinician request Non-neoplastic lung: No significant findings. (JBK:kh 02/01/08) 1 of 2 Duplicate copy FINAL for QUINDELL, SHERE ([id number]) Enid Cutter MD Pathologist, Electronic Signature (Case signed 02/02/2017) Specimen Gross and Clinical Information Specimen(s) Obtained: 1. Lung, resection (segmental or lobe), Left upper 2. Lymph node, biopsy, 11L Specimen Clinical Information 1. Lung nodule (tl) Gross 1. Received in formalin is a 260 gram, 23 x 11.5 x 5.5 cm clinically left upper lobectomy specimen. The pleura is dull,  pink-gray, focally hyperemic, and there is a discrete 1.3 x 1.1 cm area of ulceration of the pleura, revealing underlying tan firm mass. This area  is inked black. The specimen si serially sectioned to reveal a diffusely necrotic 4 x 3.2 x 3 cm tan-yellow ill defined lesion. This lesion is connected to the pleural ulceration. The lesion appears to be thinly encapsulated. The lesion is 5 cm from the final vascular and bronchial margins. Satellite nodules and additional lesion are not grossly identified. The remaining parenchyma is spongy, red-brown, unremarkable. The vascular and bronchial vessels are tan, smooth, grossly uninvolved by tumor. There are several fragmented anthracotic possible lymph node fragments, 2 x 2 x 0.4 cm in aggregate identified at the ------hilum, grossly uninvolved by tumor. Block summary: 1A= bronchial and vascular margins 1B= lobi lymph nodes. 1C= lesion to pleural ulceration. 1D-1G= additional lesion. 1H= representative normal 2. Received in saline is a 0.8 x 0.5 x 0.2 cm anthracotic lymph node, bisected and submitted entirely in block 2A. (AK:gt, 02/02/17) Report signed out from the following location(s) Technical component and interpretation was performed at [facility], [place], [place] [address]. CLIA #: [id number], 2 of 2 Duplicate copy    Discharged Condition: good  Hospital Course: The patient was admitted electively and taken the operating room on 02/01/2017 3 underwent the below described procedure. He tolerated well was taken to the surgical intensive care unit in stable condition.  Postoperative hospital course:  He initially had some difficulties postoperatively with oxygenation requiring BiPAP. Over time oxygen is being weaned to the level of nasal cannula which he may require at time of discharge. This is yet to be determined. We were assisted during the postoperative period by pulmonary medicine. He has required aggressive pulmonary hygiene and bronchodilators. He did have some acute oliguric renal failure which has improved over time. He was placed on dopamine  for a short term.Marland Kitchen He did develop postoperative atrial fibrillation and primary treatment at this point is rate control and Eliquis. He has an expected acute blood loss anemia and values are stabilized. His most recent H/H is 11/34. His most recent creatinine is 1.38. Incisions are healing well without evidence of infection. Blood pressure has been somewhat difficult to manage but he appears stable on current regimen. Due to overall deconditioning and minimal assistance at home and is felt he would best be suited to go to a skilled nursing facility for a short term for ongoing rehabilitation. He is stable for transfer  Consults: cardiology. CCM  Significant Diagnostic Studies: routine post op labs and serial CXR's  Treatments: surgery:  CARDIOTHORACIC SURGERY OPERATIVE NOTE 02/01/2017 Colbert Coyer [id number]  Surgeon:  Gaye Pollack, MD  First Assistant: Jadene Pierini, PA-C    Preoperative Diagnosis:  Left upper lobe lung nodule  Postoperative Diagnosis:  same  Procedure:  1.  Flexible video bronchoscopy 2.  Left muscle-sparing thoracotomy 3.  Left upper lobectomy   Anesthesia:  General Endotracheal Discharge Exam: Blood pressure 93/66, pulse 77, temperature 97.8 F (36.6 C), temperature source Oral, resp. rate (!) 21, height '5\' 10"'$  (1.778 m), weight 203 lb 7.8 oz (92.3 kg), SpO2 93 %.  General appearance: alert, cooperative and no distress Heart: irregularly irregular rhythm Lungs: clear to auscultation bilaterally Abdomen: benign Extremities: no edema Wound: incis healing well  Disposition: 01-Home or Self Care   Allergies as of 02/12/2017      Reactions   Colchicine Diarrhea      Medication  List    STOP taking these medications   HYDROcodone-acetaminophen 5-325 MG tablet Commonly known as:  NORCO/VICODIN   lisinopril 40 MG tablet Commonly known as:  PRINIVIL,ZESTRIL     TAKE these medications   apixaban 5 MG Tabs tablet Commonly known as:   ELIQUIS Take 1 tablet (5 mg total) by mouth 2 (two) times daily.   atorvastatin 40 MG tablet Commonly known as:  LIPITOR Take 40 mg by mouth every evening.   cyanocobalamin 1000 MCG/ML injection Commonly known as:  (VITAMIN B-12) Inject 1,000 mcg into the muscle every 30 (thirty) days. Due 01-28-2017   diltiazem 120 MG 24 hr capsule Commonly known as:  CARDIZEM CD Take 1 capsule (120 mg total) by mouth daily. Start taking on:  02/13/2017   gabapentin 300 MG capsule Commonly known as:  NEURONTIN Take 600 mg by mouth 3 (three) times daily.   omeprazole 20 MG capsule Commonly known as:  PRILOSEC Take 20 mg by mouth every evening.   phenazopyridine 100 MG tablet Commonly known as:  PYRIDIUM Take 1 tablet (100 mg total) by mouth 3 (three) times daily with meals.   sertraline 100 MG tablet Commonly known as:  ZOLOFT Take 100 mg by mouth daily.   tamsulosin 0.4 MG Caps capsule Commonly known as:  FLOMAX Take 1 capsule (0.4 mg total) by mouth daily after supper.   traMADol 50 MG tablet Commonly known as:  ULTRAM Take 1-2 tablets (50-100 mg total) by mouth every 6 (six) hours as needed (mild pain).   Vitamin D (Ergocalciferol) 50000 units Caps capsule Commonly known as:  DRISDOL Take 50,000 Units by mouth every 7 (seven) days. Every Friday            Durable Medical Equipment        Start     Ordered   02/11/17 [phone]  For home use only DME oxygen  Once    Question Answer Comment  Mode or (Route) Nasal cannula   Liters per Minute 2   Oxygen delivery system Gas      02/11/17 0813     Follow-up Information    Gaye Pollack, MD Follow up.   Specialty:  Cardiothoracic Surgery Why:  Appointment to see the nurse on 02/19/2017 at 10 AM for suture removal. Appointment to see Dr. Cyndia Bent on 02/24/2017 at 3:30 PM. Please obtain a chest x-ray [facility] imaging one half hour prior to this appointment(located in same office bldg) Contact information: [place] [address] [phone]         1. Please obtain vital signs at least one time daily 2.Please weigh the patient daily. If he or she continues to gain weight or develops lower extremity edema, contact the office at [phone]. 3. Ambulate patient at least three times daily and please use sternal precautions.  SignedJadene Pierini E 02/12/2017, 1:33 PM

## 2017-02-12 NOTE — Clinical Social Work Placement (Signed)
   CLINICAL SOCIAL WORK PLACEMENT  NOTE  Date:  02/12/2017  Patient Details  Name: Paul Valdez MRN: [id number] Date of Birth: 26-Oct-1946  Clinical Social Work is seeking post-discharge placement for this patient at the [place] level of care (*CSW will initial, date and re-position this form in  chart as items are completed):      Patient/family provided with [place] Work Department's list of facilities offering this level of care within the geographic area requested by the patient (or if unable, by the patient's family).  Yes   Patient/family informed of their freedom to choose among providers that offer the needed level of care, that participate in Medicare, Medicaid or managed care program needed by the patient, have an available bed and are willing to accept the patient.  Yes   Patient/family informed of [facility]'s ownership interest in [facility] and [facility], as well as of the fact that they are under no obligation to receive care at these facilities.  PASRR submitted to EDS on       PASRR number received on 02/12/17     Existing PASRR number confirmed on       FL2 transmitted to all facilities in geographic area requested by pt/family on       FL2 transmitted to all facilities within larger geographic area on       Patient informed that his/her managed care company has contracts with or will negotiate with certain facilities, including the following:        Yes   Patient/family informed of bed offers received.  Patient chooses bed at [facility]     Physician recommends and patient chooses bed at      Patient to be transferred to [facility] on 02/12/17.  Patient to be transferred to facility by [facility]     Patient family notified on 02/12/17 of transfer.  Name of family member notified:  [name] Teague     PHYSICIAN Please sign FL2     Additional Comment:     _______________________________________________ Wende Neighbors, LCSW 02/12/2017, 10:57 AM

## 2017-02-12 NOTE — Progress Notes (Signed)
      [address]       [place] [address]             [phone]      11 Days Post-Op Procedure(s) (LRB): FLEXIBLE BRONCHOSCOPY (N/A) THORACOTOMY/LEFT UPPER LOBECTOMY (Left) Subjective: Feels pretty well  Objective: Vital signs in last 24 hours: Temp:  [97.6 F (36.4 C)-98.8 F (37.1 C)] 97.6 F (36.4 C) (02/16 0516) Pulse Rate:  [78-107] 95 (02/16 0516) Cardiac Rhythm: Atrial fibrillation (02/15 2200) Resp:  [19-20] 20 (02/16 0516) BP: (95-128)/(53-73) 114/71 (02/16 0516) SpO2:  [92 %-94 %] 93 % (02/16 0516)  Hemodynamic parameters for last 24 hours:    Intake/Output from previous day: 02/15 0701 - 02/16 0700 In: 1080 [P.O.:1080] Out: 1400 [Urine:1400] Intake/Output this shift: No intake/output data recorded.  General appearance: alert, cooperative and no distress Heart: irregularly irregular rhythm Lungs: clear to auscultation bilaterally Abdomen: benign Extremities: no edema Wound: incis healing well  Lab Results:  Recent Labs  02/09/17 1216 02/10/17 0449  WBC 12.6* 11.3*  HGB 11.6* 11.0*  HCT 36.5* 34.8*  PLT 318 271   BMET:  Recent Labs  02/09/17 1216 02/10/17 0449  NA 135 136  K 4.4 4.7  CL 98* 99*  CO2 27 29  GLUCOSE 104* 113*  BUN 51* 50*  CREATININE 1.89* 1.38*  CALCIUM 9.1 8.7*    PT/INR: No results for input(s): LABPROT, INR in the last 72 hours. ABG    Component Value Date/Time   PHART 7.409 02/05/2017 0336   HCO3 29.9 (H) 02/05/2017 0336   TCO2 27 02/03/2017 1108   ACIDBASEDEF 2.0 02/03/2017 1108   O2SAT 98.7 02/05/2017 0336   CBG (last 3)  No results for input(s): GLUCAP in the last 72 hours.  Meds Scheduled Meds: . apixaban  5 mg Oral BID  . chlorhexidine  15 mL Mouth Rinse BID  . diltiazem  30 mg Oral Q6H  . gabapentin  600 mg Oral TID  . mouth rinse  15 mL Mouth Rinse q12n4p  . metoprolol tartrate  50 mg Oral BID  . phenazopyridine  100 mg Oral TID WC  . senna-docusate  1 tablet Oral QHS  .  sertraline  100 mg Oral Daily  . sodium chloride flush  3 mL Intracatheter Q12H  . tamsulosin  0.4 mg Oral QPC supper   Continuous Infusions: PRN Meds:.alum & mag hydroxide-simeth, levalbuterol, metoprolol, sodium chloride flush, traMADol  Xrays No results found.  Assessment/Plan: S/P Procedure(s) (LRB): FLEXIBLE BRONCHOSCOPY (N/A) THORACOTOMY/LEFT UPPER LOBECTOMY (Left)  1 doing well 2 SW working om SNF placement 3 urine culture neg 4 BP is improved overall  LOS: 11 days    Cherly Erno E 02/12/2017

## 2017-02-12 NOTE — Care Management Note (Signed)
Case Management Note Marvetta Gibbons RN, BSN Unit 2W-Case Manager [phone]  Patient Details  Name: Paul Valdez MRN: [id number] Date of Birth: 1946-01-31  Subjective/Objective:  Pt s/p thoracotomy with lobectomy                   Action/Plan: PTA pt lived at home alone, spoke with pt at bedside regarding d/c needs and recommendations- per pt he has friends that can help him but no one that can stay with him at discharge- pt interested in doing STSNF for rehab- PT updated recommendation for SNF. Pt will also need 02 at discharge- discussed STSNF with pt and pt is agreeable to SNF- will consult CSW for placement needs. Orders for home 02 and HHRN/PT on hold has current plan for SNF.   Expected Discharge Date:  02/12/17               Expected Discharge Plan:  [place]  In-House Referral:  Clinical Social Work  Discharge planning Services  CM Consult  Post Acute Care Choice:  Home Health, Durable Medical Equipment Choice offered to:  Patient  DME Arranged:  Oxygen DME Agency:  [place]:  RN, PT [facility] Agency:     Status of Service:  Completed, signed off  If discussed at [place] of Stay Meetings, dates discussed:    Discharge Disposition: skilled facility   Additional Comments:  02/12/17- 1400- Andelyn Spade RN, CM- pt for d/c to SNF today- has chosen [facility] per CSW-  Dahlia Client, [place], RN 02/12/2017, 2:06 PM

## 2017-02-12 NOTE — Progress Notes (Addendum)
Progress Note  Patient Name: Paul Valdez Date of Encounter: 02/12/2017  Primary Cardiologist: [facility] well.  Wants to go home.  BP better.  Received cardizem and rate control was better.  Inpatient Medications    Scheduled Meds: . apixaban  5 mg Oral BID  . chlorhexidine  15 mL Mouth Rinse BID  . diltiazem  30 mg Oral Q6H  . gabapentin  600 mg Oral TID  . mouth rinse  15 mL Mouth Rinse q12n4p  . phenazopyridine  100 mg Oral TID WC  . senna-docusate  1 tablet Oral QHS  . sertraline  100 mg Oral Daily  . sodium chloride flush  3 mL Intracatheter Q12H  . tamsulosin  0.4 mg Oral QPC supper   Continuous Infusions:  PRN Meds: alum & mag hydroxide-simeth, levalbuterol, metoprolol, sodium chloride flush, traMADol   Vital Signs    Vitals:   02/11/17 1408 02/11/17 2225 02/12/17 0043 02/12/17 0516  BP: (!) 102/53 (!) 95/55 106/73 114/71  Pulse: (!) 107 78 80 95  Resp: '19 20 20 20  '$ Temp: 98.8 F (37.1 C) 98.4 F (36.9 C)  97.6 F (36.4 C)  TempSrc: Oral Oral  Oral  SpO2: 94% 94% 93% 93%  Weight:      Height:        Intake/Output Summary (Last 24 hours) at 02/12/17 1051 Last data filed at 02/12/17 1000  Gross per 24 hour  Intake              840 ml  Output             1400 ml  Net             -560 ml   Filed Weights   02/06/17 0630 02/07/17 0700 02/08/17 0400  Weight: 198 lb 3.1 oz (89.9 kg) 202 lb 6.1 oz (91.8 kg) 203 lb 7.8 oz (92.3 kg)    Telemetry    AFib - Personally Reviewed  ECG    Atrial flutter- rate controlled - Personally Reviewed from 2/14  Physical Exam   GEN: No acute distress.   Neck: No JVD Cardiac: irregularly irregular.  Respiratory: Clear to auscultation bilaterally. GI: Soft, nontender, non-distended  MS: No edema; No deformity. Neuro:  Nonfocal  Psych: Normal affect   Labs    Chemistry  Recent Labs Lab 02/08/17 0320 02/09/17 1216 02/10/17 0449  NA 136 135 136  K 3.7 4.4 4.7  CL 98* 98* 99*    CO2 '29 27 29  '$ GLUCOSE 108* 104* 113*  BUN 31* 51* 50*  CREATININE 0.85 1.89* 1.38*  CALCIUM 9.0 9.1 8.7*  GFRNONAA >60 34* 50*  GFRAA >60 39* 58*  ANIONGAP '9 10 8     '$ Hematology  Recent Labs Lab 02/08/17 0320 02/09/17 1216 02/10/17 0449  WBC 8.2 12.6* 11.3*  RBC 3.83* 4.00* 3.83*  HGB 11.0* 11.6* 11.0*  HCT 34.7* 36.5* 34.8*  MCV 90.6 91.3 90.9  MCH 28.7 29.0 28.7  MCHC 31.7 31.8 31.6  RDW 14.3 14.5 14.7  PLT 222 318 271    Cardiac EnzymesNo results for input(s): TROPONINI in the last 168 hours. No results for input(s): TROPIPOC in the last 168 hours.   BNPNo results for input(s): BNP, PROBNP in the last 168 hours.   DDimer No results for input(s): DDIMER in the last 168 hours.   Radiology    No results found.  Cardiac Studies   Normal LV function in 2015  Patient Profile     71 y.o. male atrial fibrillation  Assessment & Plan    AFib/Atrial flutter: Rate control with calcium channel blocker.   BP has been better.   He has had some slow HR readings at times.  Stop metoprolol.  COntinue Cardizem.  Change to Cardizem CD 120 mg daily.  The cardizem seems to have worked better for BP and HR. He will need a repeat echo at some point in the future given the arrhythmia.  He has some pain at his incision so now may not be the best time.   HTN: As above  Continue Eliquis for stroke prevention.  Continue management of OSA.  This patients CHA2DS2-VASc Score and unadjusted Ischemic Stroke Rate (% per year) is equal to 2.2 % stroke rate/year from a score of 2  Above score calculated as 1 point each if present [CHF, HTN, DM, Vascular=MI/PAD/Aortic Plaque, Age if 65-74, or Male] Above score calculated as 2 points each if present [Age > 75, or Stroke/TIA/TE]       Signed, Larae Grooms, MD  02/12/2017, 10:51 AM

## 2017-02-12 NOTE — Progress Notes (Signed)
Report given to [facility] facility.

## 2017-02-12 NOTE — Clinical Social Work Placement (Signed)
   CLINICAL SOCIAL WORK PLACEMENT  NOTE  Date:  02/12/2017  Patient Details  Name: Paul Valdez MRN: [id number] Date of Birth: 29-May-1946  Clinical Social Work is seeking post-discharge placement for this patient at the [place] level of care (*CSW will initial, date and re-position this form in  chart as items are completed):      Patient/family provided with [facility] [place] Work Department's list of facilities offering this level of care within the geographic area requested by the patient (or if unable, by the patient's family).  Yes   Patient/family informed of their freedom to choose among providers that offer the needed level of care, that participate in Medicare, Medicaid or managed care program needed by the patient, have an available bed and are willing to accept the patient.  Yes   Patient/family informed of [place]'s ownership interest in [facility] and [facility], as well as of the fact that they are under no obligation to receive care at these facilities.  PASRR submitted to EDS on       PASRR number received on 02/12/17     Existing PASRR number confirmed on       FL2 transmitted to all facilities in geographic area requested by pt/family on       FL2 transmitted to all facilities within larger geographic area on       Patient informed that his/her managed care company has contracts with or will negotiate with certain facilities, including the following:        Yes   Patient/family informed of bed offers received.  Patient chooses bed at [facility]     Physician recommends and patient chooses bed at      Patient to be transferred to [facility] on 02/12/17.  Patient to be transferred to facility by [facility]     Patient family notified on 02/12/17 of transfer.  Name of family member notified:  [name] Teague     PHYSICIAN Please sign FL2     Additional Comment:     _______________________________________________ Wende Neighbors, LCSW 02/12/2017, 11:02 AM

## 2017-02-15 ENCOUNTER — Emergency Department (HOSPITAL_COMMUNITY): Payer: Medicare Other

## 2017-02-15 ENCOUNTER — Inpatient Hospital Stay (HOSPITAL_COMMUNITY): Payer: Medicare Other

## 2017-02-15 ENCOUNTER — Encounter (HOSPITAL_COMMUNITY): Payer: Self-pay

## 2017-02-15 ENCOUNTER — Inpatient Hospital Stay (HOSPITAL_COMMUNITY)
Admission: EM | Admit: 2017-02-15 | Discharge: 2017-02-17 | DRG: 291 | Disposition: A | Discharge status: Home / Self Care | Payer: Medicare Other | Attending: Internal Medicine | Admitting: Internal Medicine

## 2017-02-15 DIAGNOSIS — N390 Urinary tract infection, site not specified: Secondary | ICD-10-CM | POA: Diagnosis not present

## 2017-02-15 DIAGNOSIS — Z902 Acquired absence of lung [part of]: Secondary | ICD-10-CM | POA: Diagnosis not present

## 2017-02-15 DIAGNOSIS — I779 Disorder of arteries and arterioles, unspecified: Secondary | ICD-10-CM | POA: Diagnosis present

## 2017-02-15 DIAGNOSIS — Z9889 Other specified postprocedural states: Secondary | ICD-10-CM

## 2017-02-15 DIAGNOSIS — Z800 Family history of malignant neoplasm of digestive organs: Secondary | ICD-10-CM

## 2017-02-15 DIAGNOSIS — C3490 Malignant neoplasm of unspecified part of unspecified bronchus or lung: Secondary | ICD-10-CM | POA: Diagnosis present

## 2017-02-15 DIAGNOSIS — Z87891 Personal history of nicotine dependence: Secondary | ICD-10-CM

## 2017-02-15 DIAGNOSIS — Z9049 Acquired absence of other specified parts of digestive tract: Secondary | ICD-10-CM

## 2017-02-15 DIAGNOSIS — I739 Peripheral vascular disease, unspecified: Secondary | ICD-10-CM | POA: Diagnosis present

## 2017-02-15 DIAGNOSIS — S2232XA Fracture of one rib, left side, initial encounter for closed fracture: Secondary | ICD-10-CM

## 2017-02-15 DIAGNOSIS — I509 Heart failure, unspecified: Secondary | ICD-10-CM | POA: Diagnosis not present

## 2017-02-15 DIAGNOSIS — J440 Chronic obstructive pulmonary disease with acute lower respiratory infection: Secondary | ICD-10-CM | POA: Diagnosis present

## 2017-02-15 DIAGNOSIS — Z888 Allergy status to other drugs, medicaments and biological substances status: Secondary | ICD-10-CM | POA: Diagnosis not present

## 2017-02-15 DIAGNOSIS — R740 Nonspecific elevation of levels of transaminase and lactic acid dehydrogenase [LDH]: Secondary | ICD-10-CM | POA: Diagnosis present

## 2017-02-15 DIAGNOSIS — J181 Lobar pneumonia, unspecified organism: Secondary | ICD-10-CM

## 2017-02-15 DIAGNOSIS — E669 Obesity, unspecified: Secondary | ICD-10-CM | POA: Diagnosis present

## 2017-02-15 DIAGNOSIS — H409 Unspecified glaucoma: Secondary | ICD-10-CM | POA: Diagnosis present

## 2017-02-15 DIAGNOSIS — B9620 Unspecified Escherichia coli [E. coli] as the cause of diseases classified elsewhere: Secondary | ICD-10-CM | POA: Diagnosis present

## 2017-02-15 DIAGNOSIS — I110 Hypertensive heart disease with heart failure: Principal | ICD-10-CM | POA: Diagnosis present

## 2017-02-15 DIAGNOSIS — Z6830 Body mass index (BMI) 30.0-30.9, adult: Secondary | ICD-10-CM

## 2017-02-15 DIAGNOSIS — M1990 Unspecified osteoarthritis, unspecified site: Secondary | ICD-10-CM | POA: Diagnosis present

## 2017-02-15 DIAGNOSIS — K219 Gastro-esophageal reflux disease without esophagitis: Secondary | ICD-10-CM | POA: Diagnosis present

## 2017-02-15 DIAGNOSIS — J9621 Acute and chronic respiratory failure with hypoxia: Secondary | ICD-10-CM | POA: Diagnosis present

## 2017-02-15 DIAGNOSIS — Z79899 Other long term (current) drug therapy: Secondary | ICD-10-CM | POA: Diagnosis not present

## 2017-02-15 DIAGNOSIS — Z9981 Dependence on supplemental oxygen: Secondary | ICD-10-CM

## 2017-02-15 DIAGNOSIS — E7439 Other disorders of intestinal carbohydrate absorption: Secondary | ICD-10-CM | POA: Diagnosis present

## 2017-02-15 DIAGNOSIS — R0602 Shortness of breath: Secondary | ICD-10-CM | POA: Diagnosis not present

## 2017-02-15 DIAGNOSIS — F329 Major depressive disorder, single episode, unspecified: Secondary | ICD-10-CM | POA: Diagnosis present

## 2017-02-15 DIAGNOSIS — C3492 Malignant neoplasm of unspecified part of left bronchus or lung: Secondary | ICD-10-CM | POA: Diagnosis not present

## 2017-02-15 DIAGNOSIS — J189 Pneumonia, unspecified organism: Secondary | ICD-10-CM

## 2017-02-15 DIAGNOSIS — E785 Hyperlipidemia, unspecified: Secondary | ICD-10-CM | POA: Diagnosis present

## 2017-02-15 DIAGNOSIS — G629 Polyneuropathy, unspecified: Secondary | ICD-10-CM | POA: Diagnosis present

## 2017-02-15 DIAGNOSIS — Z8249 Family history of ischemic heart disease and other diseases of the circulatory system: Secondary | ICD-10-CM

## 2017-02-15 DIAGNOSIS — R0600 Dyspnea, unspecified: Secondary | ICD-10-CM

## 2017-02-15 DIAGNOSIS — I5033 Acute on chronic diastolic (congestive) heart failure: Secondary | ICD-10-CM | POA: Diagnosis present

## 2017-02-15 DIAGNOSIS — R7989 Other specified abnormal findings of blood chemistry: Secondary | ICD-10-CM | POA: Diagnosis not present

## 2017-02-15 DIAGNOSIS — Z66 Do not resuscitate: Secondary | ICD-10-CM | POA: Diagnosis present

## 2017-02-15 DIAGNOSIS — I480 Paroxysmal atrial fibrillation: Secondary | ICD-10-CM | POA: Diagnosis present

## 2017-02-15 DIAGNOSIS — D649 Anemia, unspecified: Secondary | ICD-10-CM

## 2017-02-15 DIAGNOSIS — I4819 Other persistent atrial fibrillation: Secondary | ICD-10-CM | POA: Diagnosis present

## 2017-02-15 DIAGNOSIS — I5031 Acute diastolic (congestive) heart failure: Secondary | ICD-10-CM

## 2017-02-15 DIAGNOSIS — I10 Essential (primary) hypertension: Secondary | ICD-10-CM | POA: Diagnosis present

## 2017-02-15 DIAGNOSIS — Y95 Nosocomial condition: Secondary | ICD-10-CM | POA: Diagnosis present

## 2017-02-15 DIAGNOSIS — K649 Unspecified hemorrhoids: Secondary | ICD-10-CM | POA: Diagnosis present

## 2017-02-15 DIAGNOSIS — K921 Melena: Secondary | ICD-10-CM | POA: Diagnosis present

## 2017-02-15 HISTORY — DX: Unspecified atrial fibrillation: I48.91

## 2017-02-15 LAB — URINALYSIS, ROUTINE W REFLEX MICROSCOPIC
Bilirubin Urine: NEGATIVE
Glucose, UA: NEGATIVE mg/dL
KETONES UR: NEGATIVE mg/dL
Nitrite: POSITIVE — AB
PH: 5 (ref 5.0–8.0)
Protein, ur: 30 mg/dL — AB
SPECIFIC GRAVITY, URINE: 1.016 (ref 1.005–1.030)
Squamous Epithelial / LPF: NONE SEEN

## 2017-02-15 LAB — CBC WITH DIFFERENTIAL/PLATELET
Basophils Absolute: 0 10*3/uL (ref 0.0–0.1)
Basophils Relative: 0 %
EOS ABS: 0.1 10*3/uL (ref 0.0–0.7)
EOS PCT: 0 %
HCT: 29.4 % — ABNORMAL LOW (ref 39.0–52.0)
Hemoglobin: 9.3 g/dL — ABNORMAL LOW (ref 13.0–17.0)
LYMPHS ABS: 1.6 10*3/uL (ref 0.7–4.0)
Lymphocytes Relative: 8 %
MCH: 28.8 pg (ref 26.0–34.0)
MCHC: 31.6 g/dL (ref 30.0–36.0)
MCV: 91 fL (ref 78.0–100.0)
MONO ABS: 1.4 10*3/uL — AB (ref 0.1–1.0)
MONOS PCT: 7 %
Neutro Abs: 17.6 10*3/uL — ABNORMAL HIGH (ref 1.7–7.7)
Neutrophils Relative %: 85 %
PLATELETS: 313 10*3/uL (ref 150–400)
RBC: 3.23 MIL/uL — ABNORMAL LOW (ref 4.22–5.81)
RDW: 14.9 % (ref 11.5–15.5)
WBC: 20.6 10*3/uL — ABNORMAL HIGH (ref 4.0–10.5)

## 2017-02-15 LAB — PROCALCITONIN: Procalcitonin: 0.33 ng/mL

## 2017-02-15 LAB — COMPREHENSIVE METABOLIC PANEL
ALK PHOS: 61 U/L (ref 38–126)
ALT: 22 U/L (ref 17–63)
AST: 18 U/L (ref 15–41)
Albumin: 2.4 g/dL — ABNORMAL LOW (ref 3.5–5.0)
Anion gap: 12 (ref 5–15)
BUN: 12 mg/dL (ref 6–20)
CALCIUM: 8.4 mg/dL — AB (ref 8.9–10.3)
CHLORIDE: 97 mmol/L — AB (ref 101–111)
CO2: 26 mmol/L (ref 22–32)
CREATININE: 1.03 mg/dL (ref 0.61–1.24)
GFR calc Af Amer: >60 mL/min (ref >60)
GFR calc non Af Amer: >60 mL/min (ref >60)
Glucose, Bld: 143 mg/dL — ABNORMAL HIGH (ref 65–99)
Potassium: 3.5 mmol/L (ref 3.5–5.1)
SODIUM: 135 mmol/L (ref 135–145)
Total Bilirubin: 0.7 mg/dL (ref 0.3–1.2)
Total Protein: 6 g/dL — ABNORMAL LOW (ref 6.5–8.1)

## 2017-02-15 LAB — BRAIN NATRIURETIC PEPTIDE: B Natriuretic Peptide: 984.2 pg/mL — ABNORMAL HIGH (ref 0.0–100.0)

## 2017-02-15 LAB — STREP PNEUMONIAE URINARY ANTIGEN: STREP PNEUMO URINARY ANTIGEN: NEGATIVE

## 2017-02-15 LAB — I-STAT TROPONIN, ED: TROPONIN I, POC: 0.01 ng/mL (ref 0.00–0.08)

## 2017-02-15 LAB — MAGNESIUM: Magnesium: 1.5 mg/dL — ABNORMAL LOW (ref 1.7–2.4)

## 2017-02-15 LAB — I-STAT CG4 LACTIC ACID, ED: Lactic Acid, Venous: 1.02 mmol/L (ref 0.5–1.9)

## 2017-02-15 MED ORDER — DEXTROSE 5 % IV SOLN
1.0000 g | Freq: Once | INTRAVENOUS | Status: AC
  Administered 2017-02-15: 1 g via INTRAVENOUS
  Filled 2017-02-15: qty 10

## 2017-02-15 MED ORDER — GABAPENTIN 300 MG PO CAPS
600.0000 mg | ORAL_CAPSULE | Freq: Three times a day (TID) | ORAL | Status: DC
  Administered 2017-02-15 – 2017-02-17 (×7): 600 mg via ORAL
  Filled 2017-02-15 (×3): qty 2
  Filled 2017-02-15: qty 6
  Filled 2017-02-15 (×3): qty 2

## 2017-02-15 MED ORDER — SODIUM CHLORIDE 0.9% FLUSH
3.0000 mL | Freq: Two times a day (BID) | INTRAVENOUS | Status: DC
  Administered 2017-02-15 – 2017-02-17 (×3): 3 mL via INTRAVENOUS

## 2017-02-15 MED ORDER — VANCOMYCIN HCL 10 G IV SOLR
1500.0000 mg | Freq: Once | INTRAVENOUS | Status: AC
  Administered 2017-02-15: 1500 mg via INTRAVENOUS
  Filled 2017-02-15: qty 1500

## 2017-02-15 MED ORDER — SODIUM CHLORIDE 0.9% FLUSH
3.0000 mL | INTRAVENOUS | Status: DC | PRN
  Administered 2017-02-15: 3 mL via INTRAVENOUS
  Filled 2017-02-15: qty 3

## 2017-02-15 MED ORDER — IOPAMIDOL (ISOVUE-370) INJECTION 76%
INTRAVENOUS | Status: AC
  Administered 2017-02-15: 100 mL
  Filled 2017-02-15: qty 100

## 2017-02-15 MED ORDER — FUROSEMIDE 10 MG/ML IJ SOLN
40.0000 mg | Freq: Once | INTRAMUSCULAR | Status: AC
  Administered 2017-02-15: 40 mg via INTRAVENOUS
  Filled 2017-02-15: qty 4

## 2017-02-15 MED ORDER — ACETAMINOPHEN 325 MG PO TABS
650.0000 mg | ORAL_TABLET | Freq: Four times a day (QID) | ORAL | Status: DC | PRN
  Administered 2017-02-15: 650 mg via ORAL
  Filled 2017-02-15: qty 2

## 2017-02-15 MED ORDER — FUROSEMIDE 10 MG/ML IJ SOLN
40.0000 mg | Freq: Every day | INTRAMUSCULAR | Status: DC
  Administered 2017-02-16 – 2017-02-17 (×2): 40 mg via INTRAVENOUS
  Filled 2017-02-15 (×2): qty 4

## 2017-02-15 MED ORDER — SODIUM CHLORIDE 0.9 % IV SOLN: 250.0000 mL | INTRAVENOUS | Status: DC | PRN

## 2017-02-15 MED ORDER — PROMETHAZINE HCL 25 MG PO TABS: 12.5000 mg | ORAL_TABLET | Freq: Four times a day (QID) | ORAL | Status: DC | PRN

## 2017-02-15 MED ORDER — ACETAMINOPHEN 650 MG RE SUPP: 650.0000 mg | Freq: Four times a day (QID) | RECTAL | Status: DC | PRN

## 2017-02-15 MED ORDER — VANCOMYCIN HCL IN DEXTROSE 1-5 GM/200ML-% IV SOLN
1000.0000 mg | Freq: Two times a day (BID) | INTRAVENOUS | Status: DC
  Administered 2017-02-15 – 2017-02-16 (×3): 1000 mg via INTRAVENOUS
  Filled 2017-02-15 (×4): qty 200

## 2017-02-15 MED ORDER — METOPROLOL TARTRATE 5 MG/5ML IV SOLN
2.5000 mg | Freq: Once | INTRAVENOUS | Status: AC
  Administered 2017-02-15: 2.5 mg via INTRAVENOUS
  Filled 2017-02-15: qty 5

## 2017-02-15 MED ORDER — PANTOPRAZOLE SODIUM 40 MG PO TBEC
40.0000 mg | DELAYED_RELEASE_TABLET | Freq: Every day | ORAL | Status: DC
  Administered 2017-02-15 – 2017-02-17 (×3): 40 mg via ORAL
  Filled 2017-02-15 (×3): qty 1

## 2017-02-15 MED ORDER — PHENAZOPYRIDINE HCL 100 MG PO TABS
100.0000 mg | ORAL_TABLET | Freq: Three times a day (TID) | ORAL | Status: DC
  Administered 2017-02-15 – 2017-02-17 (×6): 100 mg via ORAL
  Filled 2017-02-15 (×8): qty 1

## 2017-02-15 MED ORDER — DEXTROSE 5 % IV SOLN
1.0000 g | Freq: Three times a day (TID) | INTRAVENOUS | Status: DC
  Administered 2017-02-15 – 2017-02-17 (×6): 1 g via INTRAVENOUS
  Filled 2017-02-15 (×8): qty 1

## 2017-02-15 MED ORDER — ATORVASTATIN CALCIUM 40 MG PO TABS
40.0000 mg | ORAL_TABLET | Freq: Every evening | ORAL | Status: DC
  Administered 2017-02-15 – 2017-02-16 (×2): 40 mg via ORAL
  Filled 2017-02-15 (×2): qty 1

## 2017-02-15 MED ORDER — TAMSULOSIN HCL 0.4 MG PO CAPS
0.4000 mg | ORAL_CAPSULE | Freq: Every day | ORAL | Status: DC
  Administered 2017-02-15 – 2017-02-16 (×2): 0.4 mg via ORAL
  Filled 2017-02-15 (×2): qty 1

## 2017-02-15 MED ORDER — OXYCODONE HCL 5 MG PO TABS
5.0000 mg | ORAL_TABLET | ORAL | Status: DC | PRN
  Administered 2017-02-17: 5 mg via ORAL
  Filled 2017-02-15: qty 1

## 2017-02-15 MED ORDER — DEXTROSE 5 % IV SOLN
500.0000 mg | Freq: Once | INTRAVENOUS | Status: AC
  Filled 2017-02-15 (×2): qty 500

## 2017-02-15 MED ORDER — APIXABAN 5 MG PO TABS
5.0000 mg | ORAL_TABLET | Freq: Two times a day (BID) | ORAL | Status: DC
  Administered 2017-02-15 – 2017-02-17 (×4): 5 mg via ORAL
  Filled 2017-02-15 (×4): qty 1
  Filled 2017-02-15: qty 2

## 2017-02-15 MED ORDER — DEXTROSE 5 % IV SOLN
500.0000 mg | Freq: Once | INTRAVENOUS | Status: DC
  Filled 2017-02-15: qty 500

## 2017-02-15 MED ORDER — DILTIAZEM HCL ER COATED BEADS 120 MG PO CP24
120.0000 mg | ORAL_CAPSULE | Freq: Every day | ORAL | Status: DC
  Administered 2017-02-15 – 2017-02-17 (×3): 120 mg via ORAL
  Filled 2017-02-15 (×3): qty 1

## 2017-02-15 MED ORDER — IPRATROPIUM-ALBUTEROL 0.5-2.5 (3) MG/3ML IN SOLN
3.0000 mL | Freq: Once | RESPIRATORY_TRACT | Status: AC
  Administered 2017-02-15: 3 mL via RESPIRATORY_TRACT
  Filled 2017-02-15: qty 3

## 2017-02-15 MED ORDER — SERTRALINE HCL 100 MG PO TABS
100.0000 mg | ORAL_TABLET | Freq: Every day | ORAL | Status: DC
  Administered 2017-02-15 – 2017-02-17 (×3): 100 mg via ORAL
  Filled 2017-02-15 (×3): qty 1

## 2017-02-15 NOTE — Progress Notes (Signed)
Pharmacy Antibiotic Note  Paul Valdez is a 71 y.o. male admitted on 02/15/2017 with shortness of breath and productive cough s/p LU lobectomy for lung cancer 02/01/2017.  Pharmacy has been consulted for vancomycin dosing.   Plan: Vancomycin '1500mg'$  IV once then '1000mg'$  IV every 12 hours.  Goal trough 15-20 mcg/mL.  Height: '5\' 10"'$  (177.8 cm) Weight: 203 lb (92.1 kg) IBW/kg (Calculated) : 73  Temp (24hrs), Avg:98.9 F (37.2 C), Min:98.1 F (36.7 C), Max:99.7 F (37.6 C)   Recent Labs Lab 02/09/17 1216 02/10/17 0449 02/15/17 0330 02/15/17 0351  WBC 12.6* 11.3* 20.6*  --   CREATININE 1.89* 1.38* 1.03  --   LATICACIDVEN  --   --   --  1.02    Estimated Creatinine Clearance: 75 mL/min (by C-G formula based on SCr of 1.03 mg/dL).    Allergies  Allergen Reactions  . Colchicine Diarrhea   Thank you for allowing pharmacy to be a part of this patient's care.  Jodean Lima Elvert Cumpton 02/15/2017 12:26 PM

## 2017-02-15 NOTE — ED Provider Notes (Signed)
[facility] DEPT Provider Note   CSN: [id number] Arrival date & time: 02/15/17  6314  By signing my name below, I, Oleh Genin, attest that this documentation has been prepared under the direction and in the presence of Delora Fuel, MD. Electronically Signed: Oleh Genin, Scribe. 02/15/17. 3:53 AM.   History   Chief Complaint Chief Complaint  Patient presents with  . Shortness of Breath  . Cough    HPI Paul Valdez is a 71 y.o. male with history of CAD, HTN, HLD, and A-fib on apixaban who presents to the ED for evaluation of dyspnea and fever. This patient was initially seen 14 days ago at [facility] after findings of a lung mass in his LUL. During admission he underwent biopsy of this mass and was subsequently discharged 3 days ago to a rehab facility. Last night the patient states that he was dyspneic and febrile according to nursing staff. At interview, the patient is reporting ongoing dyspnea, back pain, and "pain over his surgical site". He is also reporting issues related to urinary frequency, discomfort. He denies any cough, chills, or sweating.   The history is provided by the patient. No language interpreter was used.    Past Medical History:  Diagnosis Date  . Arthritis   . Asthma   . Atrial fibrillation ([place])   . Atypical chest pain    a. Normal nuc 2014.  . Carotid artery disease ([place])    a. Carotid duplex 2015: 97-02% RICA, 6-37% LICA.   Marland Kitchen Cataract   . Coronary artery calcification seen on CT scan   . Depression   . Detached retina   . Former consumption of alcohol   . Former tobacco use   . GERD (gastroesophageal reflux disease)    "I take heart burn medicine"  . Glaucoma   . Hyperlipemia   . Hypertension   . Low back pain 12/29/2016  . Mass of upper lobe of left lung 12/29/2016  . PVD (peripheral vascular disease) ([place])    a. Mild plaque of iliacs in 2013 on duplex; PET 2018:  PET also corroborated coronary, aortic arch, and branch vessel  atherosclerotic vascular disease as well as aortoiliac atherosclerotic vascular disease.  . Sleep apnea    has not gotten CPAP yet    Patient Active Problem List   Diagnosis Date Noted  . Atrial fibrillation ([place]) 02/12/2017  . Atelectasis   . OSA (obstructive sleep apnea)   . S/P lobectomy of lung 02/01/2017  . Coronary artery calcification seen on CT scan 01/13/2017  . PVD (peripheral vascular disease) ([place]) 01/13/2017  . Essential hypertension 01/13/2017  . Hyperlipidemia 01/13/2017  . Nodule of left lung 12/29/2016  . Low back pain 12/29/2016  . Carotid artery disease ([place]) 11/21/2014    Past Surgical History:  Procedure Laterality Date  . APPENDECTOMY    . COLONOSCOPY    . EYE SURGERY    . FLEXIBLE BRONCHOSCOPY N/A 02/01/2017   Procedure: FLEXIBLE BRONCHOSCOPY;  Surgeon: Gaye Pollack, MD;  Location: [facility] OR;  Service: Thoracic;  Laterality: N/A;  . GANGLION CYST EXCISION     left hand  . HERNIA REPAIR     double hernia repair  . THORACOTOMY/LOBECTOMY Left 02/01/2017   Procedure: THORACOTOMY/LEFT UPPER LOBECTOMY;  Surgeon: Gaye Pollack, MD;  Location: [facility] OR;  Service: Thoracic;  Laterality: Left;  . TUMOR REMOVAL     non cancer tumor from neck       Home Medications    Prior  to Admission medications   Medication Sig Start Date End Date Taking? Authorizing Provider  apixaban (ELIQUIS) 5 MG TABS tablet Take 1 tablet (5 mg total) by mouth 2 (two) times daily. 02/12/17   Wayne E Gold, PA-C  atorvastatin (LIPITOR) 40 MG tablet Take 40 mg by mouth every evening.  11/04/14   Historical Provider, MD  cyanocobalamin (,VITAMIN B-12,) 1000 MCG/ML injection Inject 1,000 mcg into the muscle every 30 (thirty) days. Due 01-28-2017    Historical Provider, MD  diltiazem (CARDIZEM CD) 120 MG 24 hr capsule Take 1 capsule (120 mg total) by mouth daily. 02/13/17   Wayne E Gold, PA-C  gabapentin (NEURONTIN) 300 MG capsule Take 600 mg by mouth 3 (three) times daily.    Historical Provider, MD   omeprazole (PRILOSEC) 20 MG capsule Take 20 mg by mouth every evening.  09/02/14   Historical Provider, MD  phenazopyridine (PYRIDIUM) 100 MG tablet Take 1 tablet (100 mg total) by mouth 3 (three) times daily with meals. 02/12/17   Wayne E Gold, PA-C  sertraline (ZOLOFT) 100 MG tablet Take 100 mg by mouth daily.    Historical Provider, MD  tamsulosin (FLOMAX) 0.4 MG CAPS capsule Take 1 capsule (0.4 mg total) by mouth daily after supper. 02/12/17   Wayne E Gold, PA-C  traMADol (ULTRAM) 50 MG tablet Take 1-2 tablets (50-100 mg total) by mouth every 6 (six) hours as needed (mild pain). 02/12/17   Wayne E Gold, PA-C  Vitamin D, Ergocalciferol, (DRISDOL) 50000 units CAPS capsule Take 50,000 Units by mouth every 7 (seven) days. Every Friday    Historical Provider, MD    Family History Family History  Problem Relation Age of Onset  . Colon cancer Father   . Hypertension Father   . Hypertension Mother   . Colon cancer Sister   . Colon cancer Brother     Social History Social History  Substance Use Topics  . Smoking status: Former Research scientist (life sciences)  . Smokeless tobacco: Never Used  . Alcohol use No     Allergies   Colchicine   Review of Systems Review of Systems  Constitutional: Positive for fever. Negative for chills.  Respiratory: Positive for shortness of breath. Negative for cough.   Genitourinary: Positive for frequency.  Musculoskeletal: Positive for back pain.  All other systems reviewed and are negative.    Physical Exam Updated Vital Signs BP 122/69 (BP Location: Right Arm)   Pulse 106   Temp 99.7 F (37.6 C) (Oral)   Resp 20   Ht _0  (1.778 m)   Wt 203 lb (92.1 kg)   SpO2 (!) 89%   BMI 29.13 kg/m   Physical Exam  Constitutional: He is oriented to person, place, and time. He appears well-developed and well-nourished.  Appears mildly dyspneic.   HENT:  Head: Normocephalic and atraumatic.  Eyes: EOM are normal. Pupils are equal, round, and reactive to light.  Neck:  Normal range of motion. Neck supple. No JVD present.  Cardiovascular: Normal heart sounds.  An irregular rhythm present. Tachycardia present.   No murmur heard. Pulmonary/Chest: Effort normal. He has no wheezes. He exhibits no tenderness.  There is coarse expiratory rhonchi diffusely.   Abdominal: Soft. Bowel sounds are normal. He exhibits no distension and no mass. There is no tenderness.  Musculoskeletal: Normal range of motion. He exhibits no edema.  Lymphadenopathy:    He has no cervical adenopathy.  Neurological: He is alert and oriented to person, place, and time. No cranial nerve deficit.  He exhibits normal muscle tone. Coordination normal.  Skin: Skin is warm and dry. No rash noted.  Psychiatric: He has a normal mood and affect. His behavior is normal. Judgment and thought content normal.  Nursing note and vitals reviewed.    ED Treatments / Results  Labs (all labs ordered are listed, but only abnormal results are displayed) Labs Reviewed  COMPREHENSIVE METABOLIC PANEL - Abnormal; Notable for the following:       Result Value   Chloride 97 (*)    Glucose, Bld 143 (*)    Calcium 8.4 (*)    Total Protein 6.0 (*)    Albumin 2.4 (*)    All other components within normal limits  CBC WITH DIFFERENTIAL/PLATELET - Abnormal; Notable for the following:    WBC 20.6 (*)    RBC 3.23 (*)    Hemoglobin 9.3 (*)    HCT 29.4 (*)    Neutro Abs 17.6 (*)    Monocytes Absolute 1.4 (*)    All other components within normal limits  URINALYSIS, ROUTINE W REFLEX MICROSCOPIC - Abnormal; Notable for the following:    Color, Urine AMBER (*)    APPearance HAZY (*)    Hgb urine dipstick MODERATE (*)    Protein, ur 30 (*)    Nitrite POSITIVE (*)    Leukocytes, UA SMALL (*)    Bacteria, UA FEW (*)    All other components within normal limits  BRAIN NATRIURETIC PEPTIDE - Abnormal; Notable for the following:    B Natriuretic Peptide 984.2 (*)    All other components within normal limits    CULTURE, BLOOD (ROUTINE X 2)  CULTURE, BLOOD (ROUTINE X 2)  I-STAT CG4 LACTIC ACID, ED  Randolm Idol, ED    Radiology Dg Chest Port 1 View  Result Date: 02/15/2017 CLINICAL DATA:  71 year old male with fever and shortness of breath. EXAM: PORTABLE CHEST 1 VIEW COMPARISON:  Chest radiograph dated 02/09/2017 FINDINGS: There is low lung volumes. There progression of airspace density involving the left mid to lower lung fields concerning for developing infiltrate. Right mid and lower lung field interstitial prominence and streaky densities noted. There is no significant pleural effusion. No pneumothorax. There is silhouetting of the cardiac borders. Multiple surgical clips noted in the left hilar and suprahilar region. There is minimally displaced fracture of the lateral left fifth rib which appears new compared to prior study. Correlation with point tenderness recommended. IMPRESSION: 1. Increased left mid for lower lung field airspace opacity concerning for developing pneumonia versus pulmonary contusion. Right mid to lower lung field interstitial prominence and streaky densities may represent atelectasis versus infiltrate. 2. Minimally displaced fracture of the lateral aspect of the left fifth rib, new from prior study. No pneumothorax. Electronically Signed   By: Anner Crete M.D.   On: 02/15/2017 04:10    Procedures Procedures (including critical care time)  Medications Ordered in ED Medications  azithromycin (ZITHROMAX) 500 mg in dextrose 5 % 250 mL IVPB (not administered)  metoprolol (LOPRESSOR) injection 2.5 mg (2.5 mg Intravenous Given 02/15/17 0423)  ipratropium-albuterol (DUONEB) 0.5-2.5 (3) MG/3ML nebulizer solution 3 mL (3 mLs Nebulization Given 02/15/17 0423)  cefTRIAXone (ROCEPHIN) 1 g in dextrose 5 % 50 mL IVPB (1 g Intravenous New Bag/Given 02/15/17 0703)  furosemide (LASIX) injection 40 mg (40 mg Intravenous Given 02/15/17 0703)     Initial Impression / Assessment and Plan  / ED Course  I have reviewed the triage vital signs and the nursing notes.  Pertinent  labs & imaging results that were available during my care of the patient were reviewed by me and considered in my medical decision making (see chart for details).  Patient with recent lobectomy for lung cancer, currently at rehabilitation center, comes in for acute dyspnea and fever. He denies any cough. He is complaining of pain and his surgical site. Exam is of worrisome for congestive heart failure. He is obviously fluid overloaded is bibasilar rales. Chest x-rays obtained and is read by radiologist as showing retrocardiac infiltrate consistent with pneumonia. Patient is afebrile here but did have documented fever at rehabilitation center. He is noted to have significant leukocytosis with left shift. He is started on antibiotics for pneumonia. BNP is come back at nearly 1000 indicating some degree of CHF as well. He is given a dose of furosemide. He has been hemodynamically stable during his course in the ED. Case is discussed with Dr. Marily Memos of [facility] who agrees to admit the patient.  Final Clinical Impressions(s) / ED Diagnoses   Final diagnoses:  HCAP (healthcare-associated pneumonia)  Elevated brain natriuretic peptide (BNP) level  Normochromic normocytic anemia    New Prescriptions New Prescriptions   No medications on file   I personally performed the services described in this documentation, which was scribed in my presence. The recorded information has been reviewed and is accurate.      Delora Fuel, MD 65/03/54 [id number]

## 2017-02-15 NOTE — ED Triage Notes (Signed)
Pt presents via EMS from [facility] with CC of SOB, productive cough, and fevers since yesterday. Per EMS, Tiburcio Bash reports pt O2 sat dropped to 90% when coughing causing them to seek eval at the Ed. Pt was d/c from the hospital this past Fri after 15 days in the hospital s/p lung biopsy due to stage 1 lung cancer. Per EMS, wheezing all lung fields, 10 mg albuterol and 0.5 atrovent given en route. Decreased wheezing noted by EMS.

## 2017-02-15 NOTE — H&P (Signed)
History and Physical    Paul Valdez:[id number] DOB: 22-Aug-1946 DOA: 02/15/2017  PCP: Paul Kroner, MD [facility]  Chief Complaint: SOB  HPI: Paul Valdez is a 71 y.o. male with medical history significant of atrial fibrillation, and CAD, GERD, hyperlipidemia, hypertension, peripheral vascular disease with newly found left lung mass with path showing invasive squamous cell carcinoma. Of note on 02/01/2017 patient underwent flexible bronchoscopy and left upper lobe lobectomy/thoracotomy for diagnoses of lung mass. Patient was discharged to [facility] rehabilitation facility on 02/12/2017. Night prior to patient's admission he developed shortness of breath and a intermittent productive cough. Coughing resulted in some left chest wall pain. Temperature at the rehabilitation facility reached 99.7. Denies worsening lower extremity swelling. Patient states symptoms are constant and getting worse. Patient is on 3 L nasal cannula since time of biopsy which remains constant. Patient also complaining of urinary frequency ever since his bronchoscopy on 02/01/2017. Mild discomfort without flank pain or suprapubic plane. Denies cardiac type chest pain or pressure, palpitations, neck stiffness,, headache, LOC, nausea, vomiting, diarrhea.    ED Course: objective findings outlined below. Given ceftriaxone, Metop and lasix 40   Review of Systems: As per HPI otherwise 10 point review of systems negative.   Ambulatory Status:restricted only due to O2 demand    Past Medical History:  Diagnosis Date  . Arthritis   . Asthma   . Atrial fibrillation ([place])   . Atypical chest pain    a. Normal nuc 2014.  . Carotid artery disease ([place])    a. Carotid duplex 2015: 81-19% RICA, 1-47% LICA.   Marland Kitchen Cataract   . Coronary artery calcification seen on CT scan   . Depression   . Detached retina   . Former consumption of alcohol   . Former tobacco use   . GERD (gastroesophageal reflux  disease)    "I take heart burn medicine"  . Glaucoma   . Hyperlipemia   . Hypertension   . Low back pain 12/29/2016  . Mass of upper lobe of left lung 12/29/2016  . PVD (peripheral vascular disease) ([place])    a. Mild plaque of iliacs in 2013 on duplex; PET 2018:  PET also corroborated coronary, aortic arch, and branch vessel atherosclerotic vascular disease as well as aortoiliac atherosclerotic vascular disease.  . Sleep apnea    has not gotten CPAP yet    Past Surgical History:  Procedure Laterality Date  . APPENDECTOMY    . COLONOSCOPY    . EYE SURGERY    . FLEXIBLE BRONCHOSCOPY N/A 02/01/2017   Procedure: FLEXIBLE BRONCHOSCOPY;  Surgeon: Gaye Pollack, MD;  Location: [facility] OR;  Service: Thoracic;  Laterality: N/A;  . GANGLION CYST EXCISION     left hand  . HERNIA REPAIR     double hernia repair  . THORACOTOMY/LOBECTOMY Left 02/01/2017   Procedure: THORACOTOMY/LEFT UPPER LOBECTOMY;  Surgeon: Gaye Pollack, MD;  Location: [facility] OR;  Service: Thoracic;  Laterality: Left;  . TUMOR REMOVAL     non cancer tumor from neck     Social History   Social History  . Marital status: Single    Spouse name: N/A  . Number of children: N/A  . Years of education: N/A   Occupational History  . Not on file.   Social History Main Topics  . Smoking status: Former Research scientist (life sciences)  . Smokeless tobacco: Never Used  . Alcohol use No  . Drug use: No  . Sexual activity: Not  on file   Other Topics Concern  . Not on file   Social History Narrative  . No narrative on file    Allergies  Allergen Reactions  . Colchicine Diarrhea    Family History  Problem Relation Age of Onset  . Colon cancer Father   . Hypertension Father   . Hypertension Mother   . Colon cancer Sister   . Colon cancer Brother     Prior to Admission medications   Medication Sig Start Date End Date Taking? Authorizing Provider  apixaban (ELIQUIS) 5 MG TABS tablet Take 1 tablet (5 mg total) by mouth 2 (two) times daily. 02/12/17  Yes  Wayne E Gold, PA-C  atorvastatin (LIPITOR) 40 MG tablet Take 40 mg by mouth every evening.  11/04/14  Yes Historical Provider, MD  bisacodyl (BISCOLAX) 10 MG suppository Place 10 mg rectally daily as needed for moderate constipation.   Yes Historical Provider, MD  cyanocobalamin (,VITAMIN B-12,) 1000 MCG/ML injection Inject 1,000 mcg into the muscle every 30 (thirty) days. Due 01-28-2017   Yes Historical Provider, MD  diltiazem (CARDIZEM CD) 120 MG 24 hr capsule Take 1 capsule (120 mg total) by mouth daily. 02/13/17  Yes Wayne E Gold, PA-C  gabapentin (NEURONTIN) 300 MG capsule Take 600 mg by mouth 3 (three) times daily.   Yes Historical Provider, MD  magnesium hydroxide (MILK OF MAGNESIA) 400 MG/5ML suspension Take 30 mLs by mouth daily as needed for mild constipation.   Yes Historical Provider, MD  omeprazole (PRILOSEC) 20 MG capsule Take 20 mg by mouth every evening.  09/02/14  Yes Historical Provider, MD  phenazopyridine (PYRIDIUM) 100 MG tablet Take 1 tablet (100 mg total) by mouth 3 (three) times daily with meals. 02/12/17  Yes Wayne E Gold, PA-C  sertraline (ZOLOFT) 100 MG tablet Take 100 mg by mouth daily.   Yes Historical Provider, MD  Sodium Phosphates (RA SALINE ENEMA RE) Place 1 Applicatorful rectally daily as needed (constipation).   Yes Historical Provider, MD  tamsulosin (FLOMAX) 0.4 MG CAPS capsule Take 1 capsule (0.4 mg total) by mouth daily after supper. 02/12/17  Yes Wayne E Gold, PA-C  traMADol (ULTRAM) 50 MG tablet Take 1-2 tablets (50-100 mg total) by mouth every 6 (six) hours as needed (mild pain). 02/12/17  Yes Wayne E Gold, PA-C  Vitamin D, Ergocalciferol, (DRISDOL) 50000 units CAPS capsule Take 50,000 Units by mouth every 7 (seven) days. Every Friday   Yes Historical Provider, MD    Physical Exam: Vitals:   02/15/17 0700 02/15/17 0715 02/15/17 0800 02/15/17 0901  BP:  113/70 108/90 (!) 406/69  Pulse: 117 111 110 (!) 103  Resp: _0 Temp:    98.1 F (36.7 C)    TempSrc:    Oral  SpO2: 99% 94% 94% 95%  Weight:      Height:         General: To be in mild distress, resting in bed. With repetitive centigrays to 45  Eyes:  PERRL, EOMI, normal lids, iris ENT:  Hard of hearing, normal lips & tongue, mmm Neck:  no LAD, masses or thyromegaly Cardiovascular:  RRR, II/VI systolic murmur. 1-2+ LE edema.  Respiratory: Diminished breath sounds in bases with few crackles on the left. Increased effort. On 3 L nasal cannula. Abdomen:  soft, ntnd, NABS Skin: 2 silk stitches noted on exam of left chest wall. No significant erythema, induration Musculoskeletal:  grossly normal tone BUE/BLE, good ROM, no bony abnormality Psychiatric:  grossly  normal mood and affect, speech fluent and appropriate, AOx3 Neurologic:  CN 2-12 grossly intact, moves all extremities in coordinated fashion, sensation intact  Labs on Admission: I have personally reviewed following labs and imaging studies  CBC:  Recent Labs Lab 02/09/17 1216 02/10/17 0449 02/15/17 0330  WBC 12.6* 11.3* 20.6*  NEUTROABS  --   --  17.6*  HGB 11.6* 11.0* 9.3*  HCT 36.5* 34.8* 29.4*  MCV 91.3 90.9 91.0  PLT 318 271 631   Basic Metabolic Panel:  Recent Labs Lab 02/09/17 1216 02/10/17 0449 02/15/17 0330  NA 135 136 135  K 4.4 4.7 3.5  CL 98* 99* 97*  CO2 _0 GLUCOSE 104* 113* 143*  BUN 51* 50* 12  CREATININE 1.89* 1.38* 1.03  CALCIUM 9.1 8.7* 8.4*  MG 2.0  --   --    GFR: Estimated Creatinine Clearance: 75 mL/min (by C-G formula based on SCr of 1.03 mg/dL). Liver Function Tests:  Recent Labs Lab 02/15/17 0330  AST 18  ALT 22  ALKPHOS 61  BILITOT 0.7  PROT 6.0*  ALBUMIN 2.4*   No results for input(s): LIPASE, AMYLASE in the last 168 hours. No results for input(s): AMMONIA in the last 168 hours. Coagulation Profile: No results for input(s): INR, PROTIME in the last 168 hours. Cardiac Enzymes: No results for input(s): CKTOTAL, CKMB, CKMBINDEX, TROPONINI in the last  168 hours. BNP (last 3 results) No results for input(s): PROBNP in the last 8760 hours. HbA1C: No results for input(s): HGBA1C in the last 72 hours. CBG: No results for input(s): GLUCAP in the last 168 hours. Lipid Profile: No results for input(s): CHOL, HDL, LDLCALC, TRIG, CHOLHDL, LDLDIRECT in the last 72 hours. Thyroid Function Tests: No results for input(s): TSH, T4TOTAL, FREET4, T3FREE, THYROIDAB in the last 72 hours. Anemia Panel: No results for input(s): VITAMINB12, FOLATE, FERRITIN, TIBC, IRON, RETICCTPCT in the last 72 hours. Urine analysis:    Component Value Date/Time   COLORURINE AMBER (A) 02/15/2017 0344   APPEARANCEUR HAZY (A) 02/15/2017 0344   LABSPEC 1.016 02/15/2017 0344   PHURINE 5.0 02/15/2017 0344   GLUCOSEU NEGATIVE 02/15/2017 0344   HGBUR MODERATE (A) 02/15/2017 0344   BILIRUBINUR NEGATIVE 02/15/2017 0344   KETONESUR NEGATIVE 02/15/2017 0344   PROTEINUR 30 (A) 02/15/2017 0344   NITRITE POSITIVE (A) 02/15/2017 0344   LEUKOCYTESUR SMALL (A) 02/15/2017 0344    Creatinine Clearance: Estimated Creatinine Clearance: 75 mL/min (by C-G formula based on SCr of 1.03 mg/dL).  Sepsis Labs: _1 (procalcitonin:4,lacticidven:4) ) Recent Results (from the past 240 hour(s))  Urine culture     Status: Abnormal   Collection Time: 02/10/17  4:51 PM  Result Value Ref Range Status   Specimen Description URINE, CLEAN CATCH  Final   Special Requests NONE  Final   Culture <10,000 COLONIES/mL INSIGNIFICANT GROWTH (A)  Final   Report Status 02/11/2017 FINAL  Final     Radiological Exams on Admission: Dg Chest Port 1 View  Result Date: 02/15/2017 CLINICAL DATA:  71 year old male with fever and shortness of breath. EXAM: PORTABLE CHEST 1 VIEW COMPARISON:  Chest radiograph dated 02/09/2017 FINDINGS: There is low lung volumes. There progression of airspace density involving the left mid to lower lung fields concerning for developing infiltrate. Right mid and lower lung  field interstitial prominence and streaky densities noted. There is no significant pleural effusion. No pneumothorax. There is silhouetting of the cardiac borders. Multiple surgical clips noted in the left hilar and suprahilar region. There is  minimally displaced fracture of the lateral left fifth rib which appears new compared to prior study. Correlation with point tenderness recommended. IMPRESSION: 1. Increased left mid for lower lung field airspace opacity concerning for developing pneumonia versus pulmonary contusion. Right mid to lower lung field interstitial prominence and streaky densities may represent atelectasis versus infiltrate. 2. Minimally displaced fracture of the lateral aspect of the left fifth rib, new from prior study. No pneumothorax. Electronically Signed   By: Anner Crete M.D.   On: 02/15/2017 04:10     Assessment/Plan Active Problems:   Carotid artery disease ([place])   Essential hypertension   Hyperlipidemia   S/P lobectomy of lung   Atrial fibrillation ([facility])   Squamous cell carcinoma lung, left ([facility])   Acute on chronic diastolic congestive heart failure ([facility])   Complicated UTI (urinary tract infection)   Left rib fracture   Respiratory distress w/ chronic respiratory failure: Likely multifactorial from CHF and development of pulmonary contusion vs infection in pt w/ invasive SCC of the lung. O2 dependent since biopsy on 02/01/17. PET scan and MRI brain unremarkable with regards to metastatic disease. Followed by Dr. Cyndia Bent (CTS) and Dr. Earlie Server (Oncology). Will need to r/o PE given LE edema and acute onset SOB in setting of recent surgery and cancer - states compliance w/ Eliquis but only started this sometime after surgery.  - Continue 3L [place] - CT chest w/ contrast  - After CT results consult Dr Cyndia Bent if needed - Schedule outpt f/u w/ Dr. Earlie Server after discharge (no inpt workup needed) - Vanc/Cef - Procalcitonin, BCX - Lasix 40MG IV Qday (lasix naive) - Strict  I/O, Daily wts - Echo (previously shows EF of 76% grade 1 diastolic dysfunction.) - Consider ACEi and or bblocker at time of DC if BP is adequate.  Complicated UTI: UA concerning for UTI. H/o obstructive uropathy and recent catheterization from surgery. 100cc PVR in ED.  - ABX as above. - UCX - pyridium - continue flomax  L rib fracture: noted on CXR. Likely sustained during lobectomy. No PNeumothorax.  - Pain medications  Afib/HTN: Rate controlled. Trop neg x1 - continue dilt - EKG x1 - Tele x 24 hrs (DC order placed) - Continue Eliquis  HLD: - continue Lipitor  Depression: - continue zoloft  GERD: - continue PPI  Neuropathy: - continue neurontin   DVT prophylaxis: Eliquis  Code Status: DNI  Family Communication: friend  Disposition Plan: pending improvement in condition and continued workup  Consults called: Oncology (phone),  Admission status: inpatient    Marylon Verno J MD [facility]  If 7PM-7AM, please contact night-coverage [email or web address] Password [email or web address]  02/15/2017, 9:09 AM

## 2017-02-15 NOTE — ED Notes (Signed)
Pt placed on 2L [place]

## 2017-02-15 NOTE — ED Notes (Signed)
Pt in A fib on the cardiac monitor. Per pt, he was told last week after his surgery that he had atrial fibrillation.

## 2017-02-15 NOTE — Progress Notes (Signed)
Pt received from ED. Pt oriented to room and equipment.   Fritz Pickerel, RN

## 2017-02-15 NOTE — Consult Note (Signed)
[address]       [place] [address]             [phone]      Cardiothoracic Surgery Consultation.  Reason for Consult: shortness of breath and productive cough s/p LU lobectomy for lung cancer 02/01/2017 Referring Physician: Dr. Harland Dingwall is an 71 y.o. male.  HPI:   The patient is a 71 year old gentleman with hypertension, hyperlipidemia, prior smoking with COPD and asthma, OSA who underwent left thoracotomy and left upper lobectomy on 02/01/2017 by me. 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 He was referred to me by his oncologist, Dr. Julien Nordmann. He had a slow postop course due to his obesity, preop room air hypoxemia with a room air sat of 91%, moderate COPD by PFT's, OSA and deconditioning from his spine problems which he was going to have repaired surgically when this lung mass was found. He had postop atrial fibrillation with a controlled rate on Cardizem and was discharge on Eliquis. He had been in sinus prior to surgery and had cardiac clearance by Dr. Irish Lack who had seen him in 2014 for atypical chest pain. He had a negative stress test at that time.  He was discharged to [facility] on 2/16 still on oxygen at 3L  but he was ambulating and CXR looked good. He was having urinary frequency and some pain with urination but UA was negative. He was put on Flomax and Pyridium. He says that he has continued to have urinary frequency and has to go every hr. It seems that he had some problems preop and had worn Depends before.  On admission now his CXR was poor quality but showed increased air space opacity in the left mid and lower lung fields concerning for pneumonia or atelectasis. There was a fx of the left 5th rib which is from surgery. WBC ct was 11 on discharge and now 20. Temp 99.7. BNP 984, Procalcitonin 0.33, lactic acid 1.0, Troponin .01.  Past Medical History:    Diagnosis Date  . Arthritis   . Asthma   . Atrial fibrillation ([place])   . Atypical chest pain    a. Normal nuc 2014.  . Carotid artery disease ([place])    a. Carotid duplex 2015: 35-45% RICA, 6-25% LICA.   Marland Kitchen Cataract   . Coronary artery calcification seen on CT scan   . Depression   . Detached retina   . Former consumption of alcohol   . Former tobacco use   . GERD (gastroesophageal reflux disease)    "I take heart burn medicine"  . Glaucoma   . Hyperlipemia   . Hypertension   . Low back pain 12/29/2016  . Mass of upper lobe of left lung 12/29/2016  . PVD (peripheral vascular disease) ([place])    a. Mild plaque of iliacs in 2013 on duplex; PET 2018:  PET also corroborated coronary, aortic arch, and branch vessel atherosclerotic vascular disease as well as aortoiliac atherosclerotic vascular disease.  . Sleep apnea    has not gotten CPAP yet    Past Surgical History:  Procedure Laterality Date  . APPENDECTOMY    . COLONOSCOPY    . EYE SURGERY    . FLEXIBLE BRONCHOSCOPY N/A 02/01/2017   Procedure: FLEXIBLE BRONCHOSCOPY;  Surgeon: Gaye Pollack, MD;  Location: [facility];  Service: Thoracic;  Laterality: N/A;  .  GANGLION CYST EXCISION     left hand  . HERNIA REPAIR     double hernia repair  . THORACOTOMY/LOBECTOMY Left 02/01/2017   Procedure: THORACOTOMY/LEFT UPPER LOBECTOMY;  Surgeon: Gaye Pollack, MD;  Location: [facility];  Service: Thoracic;  Laterality: Left;  . TUMOR REMOVAL     non cancer tumor from neck    Family History  Problem Relation Age of Onset  . Colon cancer Father   . Hypertension Father   . Hypertension Mother   . Colon cancer Sister   . Colon cancer Brother     Social History:  reports that he has quit smoking. He has never used smokeless tobacco. He reports that he does not drink alcohol or use drugs.  Allergies:  Allergies  Allergen Reactions  . Colchicine Diarrhea    Medications:  I have reviewed the patient's current medications. Prior to Admission:   Prescriptions Prior to Admission  Medication Sig Dispense Refill Last Dose  . apixaban (ELIQUIS) 5 MG TABS tablet Take 1 tablet (5 mg total) by mouth 2 (two) times daily. 60 tablet  unk  . atorvastatin (LIPITOR) 40 MG tablet Take 40 mg by mouth every evening.   2 unk  . bisacodyl (BISCOLAX) 10 MG suppository Place 10 mg rectally daily as needed for moderate constipation.   unk  . cyanocobalamin (,VITAMIN B-12,) 1000 MCG/ML injection Inject 1,000 mcg into the muscle every 30 (thirty) days. Due 01-28-2017   unk  . diltiazem (CARDIZEM CD) 120 MG 24 hr capsule Take 1 capsule (120 mg total) by mouth daily.   unk  . gabapentin (NEURONTIN) 300 MG capsule Take 600 mg by mouth 3 (three) times daily.   unk  . magnesium hydroxide (MILK OF MAGNESIA) 400 MG/5ML suspension Take 30 mLs by mouth daily as needed for mild constipation.   unk  . omeprazole (PRILOSEC) 20 MG capsule Take 20 mg by mouth every evening.   2 unk  . phenazopyridine (PYRIDIUM) 100 MG tablet Take 1 tablet (100 mg total) by mouth 3 (three) times daily with meals. 10 tablet 0 unk  . sertraline (ZOLOFT) 100 MG tablet Take 100 mg by mouth daily.   unk  . Sodium Phosphates (RA SALINE ENEMA RE) Place 1 Applicatorful rectally daily as needed (constipation).   unk  . tamsulosin (FLOMAX) 0.4 MG CAPS capsule Take 1 capsule (0.4 mg total) by mouth daily after supper. 30 capsule  unk  . traMADol (ULTRAM) 50 MG tablet Take 1-2 tablets (50-100 mg total) by mouth every 6 (six) hours as needed (mild pain). 28 tablet 0 unk  . Vitamin D, Ergocalciferol, (DRISDOL) 50000 units CAPS capsule Take 50,000 Units by mouth every 7 (seven) days. Every Friday   unk   Scheduled: . apixaban  5 mg Oral BID  . atorvastatin  40 mg Oral QPM  . azithromycin (ZITHROMAX) 500 MG IVPB  500 mg Intravenous Once  . ceFEPime (MAXIPIME) IV  1 g Intravenous Q8H  . diltiazem  120 mg Oral Daily  . [START ON 02/16/2017] furosemide  40 mg Intravenous Daily  . gabapentin  600 mg Oral  TID  . pantoprazole  40 mg Oral Daily  . phenazopyridine  100 mg Oral TID WC  . sertraline  100 mg Oral Daily  . sodium chloride flush  3 mL Intravenous Q12H  . sodium chloride flush  3 mL Intravenous Q12H  . tamsulosin  0.4 mg Oral QPC supper  . vancomycin  1,500 mg Intravenous Once  Continuous:  JME:QASTMH chloride, acetaminophen **OR** acetaminophen, oxyCODONE, promethazine, sodium chloride flush Anti-infectives    Start     Dose/Rate Route Frequency Ordered Stop   02/15/17 1200  ceFEPIme (MAXIPIME) 1 g in dextrose 5 % 50 mL IVPB     1 g 100 mL/hr over 30 Minutes Intravenous Every 8 hours 02/15/17 0903 02/23/17 1159   02/15/17 1130  vancomycin (VANCOCIN) 1,500 mg in sodium chloride 0.9 % 500 mL IVPB     1,500 mg 250 mL/hr over 120 Minutes Intravenous  Once 02/15/17 1049     02/15/17 1000  azithromycin (ZITHROMAX) 500 mg in dextrose 5 % 250 mL IVPB     500 mg 250 mL/hr over 60 Minutes Intravenous  Once 02/15/17 0845     02/15/17 0700  cefTRIAXone (ROCEPHIN) 1 g in dextrose 5 % 50 mL IVPB     1 g 100 mL/hr over 30 Minutes Intravenous  Once 02/15/17 0652 02/15/17 0733   02/15/17 0700  azithromycin (ZITHROMAX) 500 mg in dextrose 5 % 250 mL IVPB  Status:  Discontinued     500 mg 250 mL/hr over 60 Minutes Intravenous  Once 02/15/17 9622 02/15/17 0844      Results for orders placed or performed during the hospital encounter of 02/15/17 (from the past 48 hour(s))  Comprehensive metabolic panel     Status: Abnormal   Collection Time: 02/15/17  3:30 AM  Result Value Ref Range   Sodium 135 135 - 145 mmol/L   Potassium 3.5 3.5 - 5.1 mmol/L   Chloride 97 (L) 101 - 111 mmol/L   CO2 26 22 - 32 mmol/L   Glucose, Bld 143 (H) 65 - 99 mg/dL   BUN 12 6 - 20 mg/dL   Creatinine, Ser 1.03 0.61 - 1.24 mg/dL   Calcium 8.4 (L) 8.9 - 10.3 mg/dL   Total Protein 6.0 (L) 6.5 - 8.1 g/dL   Albumin 2.4 (L) 3.5 - 5.0 g/dL   AST 18 15 - 41 U/L   ALT 22 17 - 63 U/L   Alkaline Phosphatase 61 38 - 126  U/L   Total Bilirubin 0.7 0.3 - 1.2 mg/dL   GFR calc non Af Amer >60 >60 mL/min   GFR calc Af Amer >60 >60 mL/min    Comment: (NOTE) The eGFR has been calculated using the CKD EPI equation. This calculation has not been validated in all clinical situations. eGFR's persistently <60 mL/min signify possible Chronic Kidney Disease.    Anion gap 12 5 - 15  CBC with Differential     Status: Abnormal   Collection Time: 02/15/17  3:30 AM  Result Value Ref Range   WBC 20.6 (H) 4.0 - 10.5 K/uL   RBC 3.23 (L) 4.22 - 5.81 MIL/uL   Hemoglobin 9.3 (L) 13.0 - 17.0 g/dL   HCT 29.4 (L) 39.0 - 52.0 %   MCV 91.0 78.0 - 100.0 fL   MCH 28.8 26.0 - 34.0 pg   MCHC 31.6 30.0 - 36.0 g/dL   RDW 14.9 11.5 - 15.5 %   Platelets 313 150 - 400 K/uL   Neutrophils Relative % 85 %   Neutro Abs 17.6 (H) 1.7 - 7.7 K/uL   Lymphocytes Relative 8 %   Lymphs Abs 1.6 0.7 - 4.0 K/uL   Monocytes Relative 7 %   Monocytes Absolute 1.4 (H) 0.1 - 1.0 K/uL   Eosinophils Relative 0 %   Eosinophils Absolute 0.1 0.0 - 0.7 K/uL   Basophils Relative 0 %  Basophils Absolute 0.0 0.0 - 0.1 K/uL  Urinalysis, Routine w reflex microscopic     Status: Abnormal   Collection Time: 02/15/17  3:44 AM  Result Value Ref Range   Color, Urine AMBER (A) YELLOW    Comment: BIOCHEMICALS MAY BE AFFECTED BY COLOR   APPearance HAZY (A) CLEAR   Specific Gravity, Urine 1.016 1.005 - 1.030   pH 5.0 5.0 - 8.0   Glucose, UA NEGATIVE NEGATIVE mg/dL   Hgb urine dipstick MODERATE (A) NEGATIVE   Bilirubin Urine NEGATIVE NEGATIVE   Ketones, ur NEGATIVE NEGATIVE mg/dL   Protein, ur 30 (A) NEGATIVE mg/dL   Nitrite POSITIVE (A) NEGATIVE   Leukocytes, UA SMALL (A) NEGATIVE   RBC / HPF 0-5 0 - 5 RBC/hpf   WBC, UA TOO NUMEROUS TO COUNT 0 - 5 WBC/hpf   Bacteria, UA FEW (A) NONE SEEN   Squamous Epithelial / LPF NONE SEEN NONE SEEN   WBC Clumps PRESENT    Mucous PRESENT   Brain natriuretic peptide     Status: Abnormal   Collection Time: 02/15/17  3:45  AM  Result Value Ref Range   B Natriuretic Peptide 984.2 (H) 0.0 - 100.0 pg/mL  I-stat troponin, ED     Status: None   Collection Time: 02/15/17  3:49 AM  Result Value Ref Range   Troponin i, poc 0.01 0.00 - 0.08 ng/mL   Comment 3            Comment: Due to the release kinetics of cTnI, a negative result within the first hours of the onset of symptoms does not rule out myocardial infarction with certainty. If myocardial infarction is still suspected, repeat the test at appropriate intervals.   I-Stat CG4 Lactic Acid, ED     Status: None   Collection Time: 02/15/17  3:51 AM  Result Value Ref Range   Lactic Acid, Venous 1.02 0.5 - 1.9 mmol/L  Magnesium     Status: Abnormal   Collection Time: 02/15/17  9:22 AM  Result Value Ref Range   Magnesium 1.5 (L) 1.7 - 2.4 mg/dL  Strep pneumoniae urinary antigen     Status: None   Collection Time: 02/15/17  9:45 AM  Result Value Ref Range   Strep Pneumo Urinary Antigen NEGATIVE NEGATIVE    Comment:        Infection due to S. pneumoniae cannot be absolutely ruled out since the antigen present may be below the detection limit of the test.     Dg Chest Port 1 View  Result Date: 02/15/2017 CLINICAL DATA:  71 year old male with fever and shortness of breath. EXAM: PORTABLE CHEST 1 VIEW COMPARISON:  Chest radiograph dated 02/09/2017 FINDINGS: There is low lung volumes. There progression of airspace density involving the left mid to lower lung fields concerning for developing infiltrate. Right mid and lower lung field interstitial prominence and streaky densities noted. There is no significant pleural effusion. No pneumothorax. There is silhouetting of the cardiac borders. Multiple surgical clips noted in the left hilar and suprahilar region. There is minimally displaced fracture of the lateral left fifth rib which appears new compared to prior study. Correlation with point tenderness recommended. IMPRESSION: 1. Increased left mid for lower lung  field airspace opacity concerning for developing pneumonia versus pulmonary contusion. Right mid to lower lung field interstitial prominence and streaky densities may represent atelectasis versus infiltrate. 2. Minimally displaced fracture of the lateral aspect of the left fifth rib, new from prior study. No pneumothorax. Electronically  Signed   By: Anner Crete M.D.   On: 02/15/2017 04:10    Review of Systems  Constitutional: Negative for chills and fever.  HENT: Positive for hearing loss.   Eyes: Negative.   Respiratory: Positive for cough and sputum production. Negative for hemoptysis and shortness of breath.   Cardiovascular: Negative for chest pain, orthopnea, leg swelling and PND.  Gastrointestinal: Negative.   Genitourinary: Positive for dysuria and frequency.  Musculoskeletal: Positive for back pain.  Skin: Negative.   Neurological: Negative.   Endo/Heme/Allergies: Negative.   Psychiatric/Behavioral: Negative.    Blood pressure 106/69, pulse (!) 103, temperature 98.1 F (36.7 C), temperature source Oral, resp. rate 18, height _0  (1.778 m), weight 92.1 kg (203 lb), SpO2 95 %. Physical Exam  Constitutional: He is oriented to person, place, and time.  Obese gentleman in no distress. Able to talk non-stop  HENT:  Head: Normocephalic and atraumatic.  Eyes: EOM are normal. Pupils are equal, round, and reactive to light.  Neck: Normal range of motion. Neck supple. No JVD present.  Cardiovascular: Normal rate and regular rhythm.   Murmur heard. 2/6 systolic murmur RSB  Respiratory: Effort normal. No respiratory distress. He has wheezes.  Slight decrease in breath sounds on left with rhonchi.  Left thoracotomy incision healing well. Chest tube sites healed with sutures in place.  GI: Soft. Bowel sounds are normal. He exhibits no distension. There is no tenderness.  Musculoskeletal: He exhibits edema.  Lymphadenopathy:    He has no cervical adenopathy.  Neurological: He is  alert and oriented to person, place, and time.  Skin: Skin is warm and dry.  Psychiatric: He has a normal mood and affect.    Assessment/Plan:  This 71 year old gentleman has moderate COPD, resting hypoxemia on room air preop, OSA and struggled after left upper lobectomy for lung cancer. He gradually improved and CXR cleared and he was discharge to SNF for rehab. He now presents with shortness of breath and productive cough, low grade fever and leukocytosis. He says he feels fine and did not think he needed to return to the hospital although he has always been in denial about his general condition. His CXR is poor quality but the left lung looks much worse and he could have pneumonia or mucous plugging and atelectasis or effusion. I think a CTA is indicated to evaluate this further and rule out PE, although he has been on Eliquis. The Eliquis was only started prior to discharge due to the concern about bleeding early postop. He could also have some CHF with an elevated BNP, lower extremity edema and some diastolic dysfunction on prior echo in 2015. I will follow up on his CTA. I encouraged him to use his IS, cough and mobilize. Agree with antibiotic for possible pneumonia and follow up on urine culture given UA results.  Gaye Pollack 02/15/2017, 10:44 AM

## 2017-02-16 ENCOUNTER — Inpatient Hospital Stay (HOSPITAL_COMMUNITY): Payer: Medicare Other

## 2017-02-16 DIAGNOSIS — I509 Heart failure, unspecified: Secondary | ICD-10-CM

## 2017-02-16 LAB — CBC
HEMATOCRIT: 28.8 % — AB (ref 39.0–52.0)
Hemoglobin: 9.2 g/dL — ABNORMAL LOW (ref 13.0–17.0)
MCH: 29.1 pg (ref 26.0–34.0)
MCHC: 31.9 g/dL (ref 30.0–36.0)
MCV: 91.1 fL (ref 78.0–100.0)
Platelets: 316 10*3/uL (ref 150–400)
RBC: 3.16 MIL/uL — AB (ref 4.22–5.81)
RDW: 14.9 % (ref 11.5–15.5)
WBC: 13.1 10*3/uL — AB (ref 4.0–10.5)

## 2017-02-16 LAB — BASIC METABOLIC PANEL
ANION GAP: 10 (ref 5–15)
BUN: 11 mg/dL (ref 6–20)
CALCIUM: 8.3 mg/dL — AB (ref 8.9–10.3)
CO2: 29 mmol/L (ref 22–32)
Chloride: 99 mmol/L — ABNORMAL LOW (ref 101–111)
Creatinine, Ser: 0.91 mg/dL (ref 0.61–1.24)
GFR calc non Af Amer: >60 mL/min (ref >60)
GLUCOSE: 100 mg/dL — AB (ref 65–99)
Potassium: 3.5 mmol/L (ref 3.5–5.1)
Sodium: 138 mmol/L (ref 135–145)

## 2017-02-16 LAB — LEGIONELLA PNEUMOPHILA SEROGP 1 UR AG: L. pneumophila Serogp 1 Ur Ag: NEGATIVE

## 2017-02-16 LAB — ECHOCARDIOGRAM COMPLETE
Height: 70 in
Weight: 3193.6 oz

## 2017-02-16 LAB — MRSA PCR SCREENING: MRSA by PCR: NEGATIVE

## 2017-02-16 LAB — HIV ANTIBODY (ROUTINE TESTING W REFLEX): HIV SCREEN 4TH GENERATION: NONREACTIVE

## 2017-02-16 MED ORDER — HYDROCORTISONE 2.5 % RE CREA
TOPICAL_CREAM | Freq: Four times a day (QID) | RECTAL | Status: DC
  Administered 2017-02-16 (×4): via RECTAL
  Filled 2017-02-16 (×2): qty 28.35

## 2017-02-16 NOTE — Care Management Note (Addendum)
Case Management Note Marvetta Gibbons RN, BSN Unit 2W-Case Manager [phone]  Patient Details  Name: Paul Valdez MRN: [id number] Date of Birth: Apr 27, 1946  Subjective/Objective:  Pt readmitted with SOB from recent discharge on 2/16                  Action/Plan: PTA pt was at [facility] for rehab, CSW following for return to SNF when medically stable  Expected Discharge Date:                  Expected Discharge Plan:  [place]  In-House Referral:  Clinical Social Work  Discharge planning Services  CM Consult  Post Acute Care Choice:    Choice offered to:     DME Arranged:    DME Agency:     HH Arranged:    [place] Agency:     Status of Service:  In process, will continue to follow  If discussed at Long Length of Stay Meetings, dates discussed:    Additional Comments:  02/16/17- 1630- Marvetta Gibbons RN, CM- received call from [place] with [facility] regarding d/c plans- contact# [phone] if anything needed for discharge- noted that pt was at [facility] with plan to return to SNF- if plans change she can assist with any d/c needs.   [name], RN 02/16/2017, 10:35 AM

## 2017-02-16 NOTE — Evaluation (Signed)
Occupational Therapy Evaluation Patient Details Name: Paul Valdez MRN: [id number] DOB: November 12, 1946 Today's Date: 02/16/2017    History of Present Illness 71 yo readmitted from [facility] 2/19 with respiratory distress after recent D/C 2/16 for LUL lobectomy after mass found on scan in prep for back surgery. Pt with respiratory acidosis and lethargy post op. PMHx: back pain, CAD, oA, HTN   Clinical Impression   Pt reports he was managing BADL independently at SNF PTA. Currently pt overall min assist for functional mobility and min guard for ADL. Pt presenting with generalized weakness, deconditioning, quick to fatigue, SOB with activity, and impaired standing balance impacting his independence and safety with ADL and functional mobility. SpO2 down to 80% on RA with activity; returned to mid 90s within 1 minute on 2L supplemental O2. Pt planning to d/c home with intermittent supervision from friends. Recommending HHOT for follow up to maximize independence and safety with ADL and functional mobility upon return home. Pt would benefit from continued skilled OT to address established goals.    Follow Up Recommendations  Home health OT;Supervision/Assistance - 24 hour    Equipment Recommendations  3 in 1 bedside commode    Recommendations for Other Services       Precautions / Restrictions Precautions Precautions: Fall Precaution Comments: watch O2 sats Restrictions Weight Bearing Restrictions: No      Mobility Bed Mobility Overal bed mobility: Needs Assistance Bed Mobility: Supine to Sit     Supine to sit: Supervision     General bed mobility comments: HOB flat without use of rail. Increased time required  Transfers Overall transfer level: Needs assistance Equipment used: 1 person hand held assist Transfers: Sit to/from Stand Sit to Stand: Min guard Stand pivot transfers: Min guard       General transfer comment: Min guard for sit to stand without AD. Min assist for steadying  balance with dynamic standing balance    Balance Overall balance assessment: Needs assistance Sitting-balance support: Feet supported;No upper extremity supported Sitting balance-Leahy Scale: Good     Standing balance support: Single extremity supported Standing balance-Leahy Scale: Fair                              ADL Overall ADL's : Needs assistance/impaired Eating/Feeding: Independent;Sitting   Grooming: Min guard;Standing   Upper Body Bathing: Set up;Supervision/ safety;Sitting   Lower Body Bathing: Min guard;Sit to/from stand   Upper Body Dressing : Set up;Supervision/safety;Sitting   Lower Body Dressing: Min guard;Sit to/from stand Lower Body Dressing Details (indicate cue type and reason): Able to adjust socks sitting EOB Toilet Transfer: Minimal assistance;Ambulation (hand held assist) Toilet Transfer Details (indicate cue type and reason): Simulated by sit to stand from EOB with functional mobility in room         Functional mobility during ADLs: Minimal assistance (hand held assist) General ADL Comments: SpO2 down to 80% on RA with activity, returned to mid 90s within 1 minute on 2L supplemental O2. Educated pt on pursed lip breathing; he was able to return demo in session.     Vision         Perception     Praxis      Pertinent Vitals/Pain Pain Assessment: No/denies pain     Hand Dominance Right   Extremity/Trunk Assessment Upper Extremity Assessment Upper Extremity Assessment: Generalized weakness   Lower Extremity Assessment Lower Extremity Assessment: Defer to PT evaluation   Cervical / Trunk Assessment Cervical /  Trunk Assessment: Kyphotic   Communication Communication Communication: No difficulties   Cognition Arousal/Alertness: Awake/alert Behavior During Therapy: WFL for tasks assessed/performed Overall Cognitive Status: Within Functional Limits for tasks assessed                     General Comments        Exercises       Shoulder Instructions      Home Living Family/patient expects to be discharged to:: Private residence Living Arrangements: Alone Available Help at Discharge: Friend(s);Available PRN/intermittently Type of Home: House Home Access: Level entry     Home Layout: One level     Bathroom Shower/Tub: Occupational psychologist: Handicapped height     Home Equipment: Cane - single point;Grab bars - tub/shower          Prior Functioning/Environment Level of Independence: Independent        Comments: pt has been using RW and assist for ADLs since last admission        OT Problem List: Decreased strength;Decreased activity tolerance;Impaired balance (sitting and/or standing);Decreased knowledge of use of DME or AE;Cardiopulmonary status limiting activity;Obesity      OT Treatment/Interventions: Self-care/ADL training;Energy conservation;DME and/or AE instruction;Therapeutic activities;Patient/family education;Balance training    OT Goals(Current goals can be found in the care plan section) Acute Rehab OT Goals Patient Stated Goal: return home OT Goal Formulation: With patient Time For Goal Achievement: 03/02/17 Potential to Achieve Goals: Good ADL Goals Pt Will Perform Grooming: with modified independence;standing (x3 tasks) Pt Will Transfer to Toilet: with modified independence;ambulating;bedside commode (over toilet) Pt Will Perform Toileting - Clothing Manipulation and hygiene: with modified independence;sit to/from stand Pt Will Perform Tub/Shower Transfer: Shower transfer;with modified independence;ambulating;3 in 1;rolling walker Additional ADL Goal #1: Pt will independently recall 3 energy conservation strategies.  OT Frequency: Min 3X/week   Barriers to D/C: Decreased caregiver support  lives alone       Co-evaluation              End of Session Equipment Utilized During Treatment: Oxygen Nurse Communication: Mobility  status;Other (comment) (SpO2 down to 80% on RA with activity)  Activity Tolerance: Patient tolerated treatment well Patient left: in chair;with call bell/phone within reach  OT Visit Diagnosis: Unsteadiness on feet (R26.81);Muscle weakness (generalized) (M62.81)                ADL either performed or assessed with clinical judgement  Time: 0757-3225 OT Time Calculation (min): 23 min Charges:  OT General Charges $OT Visit: 1 Procedure OT Evaluation $OT Eval Moderate Complexity: 1 Procedure OT Treatments $Self Care/Home Management : 8-22 mins G-Codes:     Mel Almond A. Ulice Brilliant, M.S., OTR/L Pager: [place] 02/16/2017, 4:30 PM

## 2017-02-16 NOTE — Progress Notes (Signed)
  Echocardiogram 2D Echocardiogram has been performed.  Paul Valdez 02/16/2017, 3:59 PM

## 2017-02-16 NOTE — Progress Notes (Signed)
[facility] Progress Note  Patient: Paul Valdez [id number]:[id number]   PCP: Gara Kroner, MD DOB: October 17, 1946   DOA: 02/15/2017   DOS: 02/16/2017   Date of Service: the patient was seen and examined on 02/16/2017   Subjective: Feeling better, continues to have cough as well as shortness of breath, complains about having generalized weakness and fatigue. Had bleeding from hemorrhoids last night.  Brief hospital course: Pt. with PMH of atrial fibrillation, and CAD, GERD, hyperlipidemia, hypertension, peripheral vascular disease with newly found left lung mass with path showing invasive squamous cell carcinoma, recently discharged from cardiothoracic surgery on 02/12/2017 after lung lobectomy; admitted on 02/15/2017, with complaint of shortness of breath, was found to have healthcare associated pneumonia with possible UTI. Currently further plan is continue antibiotics.  Assessment and Plan: 1. Healthcare associated pneumonia. Possible UTI. Acute on chronic respiratory failure. Patient appears to have developed healthcare associated pneumonia with leukocytosis. WBC improving significantly. Currently on oxygen. CT chest negative for pulmonary embolism. Mild elevation of glucose intolerance. Follow blood cultures. Consider spirometry.  2. Suspected acute on chronic diastolic CHF. Currently on IV Lasix also feeling better. Mild elevation of BNP on admission. Echogram shows 55% EF with grade 1 diastolic dysfunction. We'll continue Lasix today IV and switch to oral tomorrow.  3. 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 Chronic respiratory failure. Dr. Caffie Pinto from cardiac thoracic surgery and whether the patient, no acute intervention at present recommended. Patient will follow-up with Dr. Julien Nordmann as an outpatient. Recently discharge on 2 L of oxygen. Continue incentive spirometry  4. Bleeding hemorrhoids. We will use Anusol cream scheduled. Patient is an  Eliquis any to monitor closely for the further episode of bleeding.  5. A. fib. Currently rate controlled. Continue Cardizem and Eliquis. If he has persistent bleeding from the hemorrhoid we may have to reconsider continuation of the Eliquis.  6. Left rib fracture. Continue close monitoring and incentive spirometry.  7. Dyslipidemia. Continue Lipitor.  Depression: - continue zoloft  GERD: - continue PPI  Neuropathy: - continue neurontin  Bowel regimen: last BM 02/15/2017 Diet: Cardiac and carb modified diet DVT Prophylaxis: on therapeutic anticoagulation.  Advance goals of care discussion: Partial code  Family Communication: no family was present at bedside, at the time of interview.  Disposition:  Discharge to SNF. Expected discharge date: Once stable, negative blood cultures  Consultants: Cardiac thoracic surgery Procedures: none  Antibiotics: Anti-infectives    Start     Dose/Rate Route Frequency Ordered Stop   02/15/17 2200  vancomycin (VANCOCIN) IVPB 1000 mg/200 mL premix     1,000 mg 200 mL/hr over 60 Minutes Intravenous Every 12 hours 02/15/17 1223     02/15/17 1200  ceFEPIme (MAXIPIME) 1 g in dextrose 5 % 50 mL IVPB     1 g 100 mL/hr over 30 Minutes Intravenous Every 8 hours 02/15/17 0903 02/23/17 1159   02/15/17 1130  vancomycin (VANCOCIN) 1,500 mg in sodium chloride 0.9 % 500 mL IVPB     1,500 mg 250 mL/hr over 120 Minutes Intravenous  Once 02/15/17 1049 02/15/17 1547   02/15/17 1000  azithromycin (ZITHROMAX) 500 mg in dextrose 5 % 250 mL IVPB     500 mg 250 mL/hr over 60 Minutes Intravenous  Once 02/15/17 0845 02/15/17 1945   02/15/17 0700  cefTRIAXone (ROCEPHIN) 1 g in dextrose 5 % 50 mL IVPB     1 g 100 mL/hr over 30 Minutes Intravenous  Once 02/15/17 4481  02/15/17 0733   02/15/17 0700  azithromycin (ZITHROMAX) 500 mg in dextrose 5 % 250 mL IVPB  Status:  Discontinued     500 mg 250 mL/hr over 60 Minutes Intravenous  Once 02/15/17 0652 02/15/17  0844        Objective: Physical Exam: Vitals:   02/15/17 0800 02/15/17 0901 02/15/17 2029 02/16/17 0459  BP: 108/90 106/69 116/75 (!) 150/89  Pulse: 110 (!) 103 96 (!) 112  Resp: '21 18 18 18  '$ Temp:  98.1 F (36.7 C) 98.3 F (36.8 C) 97.5 F (36.4 C)  TempSrc:  Oral Oral Oral  SpO2: 94% 95% 99% 94%  Weight:    90.5 kg (199 lb 9.6 oz)  Height:        Intake/Output Summary (Last 24 hours) at 02/16/17 1158 Last data filed at 02/16/17 1015  Gross per 24 hour  Intake              710 ml  Output             1200 ml  Net             -490 ml   Filed Weights   02/15/17 0314 02/16/17 0459  Weight: 92.1 kg (203 lb) 90.5 kg (199 lb 9.6 oz)    General: Alert, Awake and Oriented to Time, Place and Person. Appear in mild distress, affect appropriate Eyes: PERRL, Conjunctiva normal ENT: Oral Mucosa clear moist. Neck: no JVD, no Abnormal Mass Or lumps Cardiovascular: S1 and S2 Present, aortic systolic Murmur, Respiratory: Bilateral Air entry equal and Decreased, no use of accessory muscle, bilateral Crackles, no wheezes Abdomen: Bowel Sound present, Soft and no tenderness Skin: no redness, no Rash, no induration Extremities: trace Pedal edema, no calf tenderness Neurologic: Grossly no focal neuro deficit. Bilaterally Equal motor strength  Data Reviewed: CBC:  Recent Labs Lab 02/09/17 1216 02/10/17 0449 02/15/17 0330 02/16/17 0419  WBC 12.6* 11.3* 20.6* 13.1*  NEUTROABS  --   --  17.6*  --   HGB 11.6* 11.0* 9.3* 9.2*  HCT 36.5* 34.8* 29.4* 28.8*  MCV 91.3 90.9 91.0 91.1  PLT 318 271 313 160   Basic Metabolic Panel:  Recent Labs Lab 02/09/17 1216 02/10/17 0449 02/15/17 0330 02/15/17 0922 02/16/17 0419  NA 135 136 135  --  138  K 4.4 4.7 3.5  --  3.5  CL 98* 99* 97*  --  99*  CO2 '27 29 26  '$ --  29  GLUCOSE 104* 113* 143*  --  100*  BUN 51* 50* 12  --  11  CREATININE 1.89* 1.38* 1.03  --  0.91  CALCIUM 9.1 8.7* 8.4*  --  8.3*  MG 2.0  --   --  1.5*  --      Liver Function Tests:  Recent Labs Lab 02/15/17 0330  AST 18  ALT 22  ALKPHOS 61  BILITOT 0.7  PROT 6.0*  ALBUMIN 2.4*   No results for input(s): LIPASE, AMYLASE in the last 168 hours. No results for input(s): AMMONIA in the last 168 hours. Coagulation Profile: No results for input(s): INR, PROTIME in the last 168 hours. Cardiac Enzymes: No results for input(s): CKTOTAL, CKMB, CKMBINDEX, TROPONINI in the last 168 hours. BNP (last 3 results) No results for input(s): PROBNP in the last 8760 hours.  CBG: No results for input(s): GLUCAP in the last 168 hours.  Studies: No results found.   Scheduled Meds: . apixaban  5 mg Oral BID  .  atorvastatin  40 mg Oral QPM  . ceFEPime (MAXIPIME) IV  1 g Intravenous Q8H  . diltiazem  120 mg Oral Daily  . furosemide  40 mg Intravenous Daily  . gabapentin  600 mg Oral TID  . hydrocortisone   Rectal QID  . pantoprazole  40 mg Oral Daily  . phenazopyridine  100 mg Oral TID WC  . sertraline  100 mg Oral Daily  . sodium chloride flush  3 mL Intravenous Q12H  . sodium chloride flush  3 mL Intravenous Q12H  . tamsulosin  0.4 mg Oral QPC supper  . vancomycin  1,000 mg Intravenous Q12H   Continuous Infusions: PRN Meds: sodium chloride, acetaminophen **OR** acetaminophen, oxyCODONE, promethazine, sodium chloride flush  Time spent: 30 minutes  Author: Berle Mull, MD [facility] Pager: [phone] 02/16/2017 11:58 AM  If 7PM-7AM, please contact night-coverage at [email or web address], password [facility]

## 2017-02-16 NOTE — Progress Notes (Signed)
Pt had a small amount of bright red blood from rectal area. Pt has hemorrhoids and this is not the first time he has seen blood. Pt's Hgb is stable from the day before. Pt states he will speak with the doctor when he makes rounds the morning and he will report any more blood seen.

## 2017-02-16 NOTE — Evaluation (Addendum)
Physical Therapy Evaluation Patient Details Name: JENNER ROSIER MRN: [id number] DOB: 1946-04-28 Today's Date: 02/16/2017   History of Present Illness  71 yo readmitted from [facility] 2/19 with respiratory distress after recent D/C 2/16 for LUL lobectomy after mass found on scan in prep for back surgery. Pt with respiratory acidosis and lethargy post op. PMHx: back pain, CAD, oA, HTN  Clinical Impression  Pt very familiar from last admission and is cognitively intact stating strong desire to avoid SNF and return home. Mirna Mires present throughout stating he can have Mr.Altland stay with him at night and will provide intermittent supervision during the day along with Mr.Millican stating desire to hire help during the day for home. Pt with decreased strength, activity tolerance and cardiopulmonary status who will benefit from acute therapy to maximize mobility, function and gait to increase independence for safe return home. Pt requires 3L with gait to maintain sats 93-98%.     Follow Up Recommendations Supervision for mobility/OOB;Home health PT if daytime assist can be arranged, otherwise return to SNF    Equipment Recommendations  Rolling walker with 5" wheels;3in1 (PT)    Recommendations for Other Services OT consult     Precautions / Restrictions Precautions Precautions: Fall      Mobility  Bed Mobility               General bed mobility comments: in chair on arrival   Transfers Overall transfer level: Needs assistance   Transfers: Sit to/from Stand;Stand Pivot Transfers Sit to Stand: Supervision Stand pivot transfers: Min guard       General transfer comment: cues for hand placement  Ambulation/Gait Ambulation/Gait assistance: Supervision Ambulation Distance (Feet): 450 Feet Assistive device: Rolling walker (2 wheeled) Gait Pattern/deviations: Step-through pattern;Decreased stride length;Trunk flexed Gait velocity: decreased   General Gait Details: Verbal cues to  stand upright during gait, and keep feet inside RW  Stairs            Wheelchair Mobility    Modified Rankin (Stroke Patients Only)       Balance Overall balance assessment: Needs assistance Sitting-balance support: Single extremity supported         Standing balance-Leahy Scale: Fair                               Pertinent Vitals/Pain Pain Assessment: No/denies pain    Home Living Family/patient expects to be discharged to:: Private residence Living Arrangements: Alone Available Help at Discharge: Friend(s);Available PRN/intermittently Type of Home: House Home Access: Level entry     Home Layout: One level Home Equipment: Cane - single point;Grab bars - tub/shower      Prior Function Level of Independence: Independent         Comments: pt has been using RW and assist for ADLs since last admission     Hand Dominance        Extremity/Trunk Assessment   Upper Extremity Assessment Upper Extremity Assessment: Generalized weakness    Lower Extremity Assessment Lower Extremity Assessment: Generalized weakness    Cervical / Trunk Assessment Cervical / Trunk Assessment: Kyphotic  Communication   Communication: No difficulties  Cognition Arousal/Alertness: Awake/alert Behavior During Therapy: WFL for tasks assessed/performed Overall Cognitive Status: Within Functional Limits for tasks assessed                      General Comments      Exercises     Assessment/Plan  PT Assessment Patient needs continued PT services  PT Problem List Decreased strength;Decreased mobility;Decreased safety awareness;Decreased activity tolerance;Cardiopulmonary status limiting activity;Decreased balance;Decreased knowledge of use of DME       PT Treatment Interventions DME instruction;Therapeutic activities;Cognitive remediation;Gait training;Therapeutic exercise;Patient/family education;Balance training;Functional mobility training     PT Goals (Current goals can be found in the Care Plan section)  Acute Rehab PT Goals Patient Stated Goal: return home PT Goal Formulation: With patient Time For Goal Achievement: 03/02/17 Potential to Achieve Goals: Good    Frequency Min 3X/week   Barriers to discharge Decreased caregiver support pt able to stay with friend at night and have intermittent assist during the day. pt states he will willingly pay for assist at home to avoid SNF    Co-evaluation               End of Session Equipment Utilized During Treatment: Gait belt;Oxygen Activity Tolerance: Patient tolerated treatment well Patient left: in chair;with call bell/phone within reach;with family/visitor present Nurse Communication: Mobility status PT Visit Diagnosis: Difficulty in walking, not elsewhere classified (R26.2)         Time: 7619-5093 PT Time Calculation (min) (ACUTE ONLY): 32 min   Charges:   PT Evaluation $PT Eval Moderate Complexity: 1 Procedure PT Treatments $Gait Training: 8-22 mins   PT G Codes:         Demetric Dunnaway B Chyan Carnero Feb 25, 2017, 1:30 PM  Elwyn Reach, [place]

## 2017-02-17 DIAGNOSIS — I5033 Acute on chronic diastolic (congestive) heart failure: Secondary | ICD-10-CM

## 2017-02-17 DIAGNOSIS — J189 Pneumonia, unspecified organism: Secondary | ICD-10-CM

## 2017-02-17 DIAGNOSIS — N390 Urinary tract infection, site not specified: Secondary | ICD-10-CM

## 2017-02-17 DIAGNOSIS — I480 Paroxysmal atrial fibrillation: Secondary | ICD-10-CM

## 2017-02-17 LAB — CBC WITH DIFFERENTIAL/PLATELET
Basophils Absolute: 0 10*3/uL (ref 0.0–0.1)
Basophils Relative: 0 %
Eosinophils Absolute: 0.3 10*3/uL (ref 0.0–0.7)
Eosinophils Relative: 5 %
HEMATOCRIT: 30.6 % — AB (ref 39.0–52.0)
Hemoglobin: 9.8 g/dL — ABNORMAL LOW (ref 13.0–17.0)
Lymphocytes Relative: 13 %
Lymphs Abs: 0.9 10*3/uL (ref 0.7–4.0)
MCH: 29.3 pg (ref 26.0–34.0)
MCHC: 32 g/dL (ref 30.0–36.0)
MCV: 91.3 fL (ref 78.0–100.0)
MONO ABS: 0.6 10*3/uL (ref 0.1–1.0)
Monocytes Relative: 9 %
NEUTROS ABS: 5.2 10*3/uL (ref 1.7–7.7)
NEUTROS PCT: 73 %
Platelets: 365 10*3/uL (ref 150–400)
RBC: 3.35 MIL/uL — ABNORMAL LOW (ref 4.22–5.81)
RDW: 14.8 % (ref 11.5–15.5)
WBC: 7.1 10*3/uL (ref 4.0–10.5)

## 2017-02-17 LAB — COMPREHENSIVE METABOLIC PANEL
ALBUMIN: 2.3 g/dL — AB (ref 3.5–5.0)
ALT: 90 U/L — ABNORMAL HIGH (ref 17–63)
AST: 80 U/L — ABNORMAL HIGH (ref 15–41)
Alkaline Phosphatase: 78 U/L (ref 38–126)
Anion gap: 9 (ref 5–15)
BILIRUBIN TOTAL: 0.4 mg/dL (ref 0.3–1.2)
BUN: 10 mg/dL (ref 6–20)
CHLORIDE: 95 mmol/L — AB (ref 101–111)
CO2: 33 mmol/L — ABNORMAL HIGH (ref 22–32)
Calcium: 8.5 mg/dL — ABNORMAL LOW (ref 8.9–10.3)
Creatinine, Ser: 0.97 mg/dL (ref 0.61–1.24)
GFR calc Af Amer: >60 mL/min (ref >60)
GFR calc non Af Amer: >60 mL/min (ref >60)
Glucose, Bld: 109 mg/dL — ABNORMAL HIGH (ref 65–99)
POTASSIUM: 3.6 mmol/L (ref 3.5–5.1)
Sodium: 137 mmol/L (ref 135–145)
Total Protein: 6 g/dL — ABNORMAL LOW (ref 6.5–8.1)

## 2017-02-17 LAB — URINE CULTURE

## 2017-02-17 LAB — PROCALCITONIN: Procalcitonin: 0.23 ng/mL

## 2017-02-17 LAB — MAGNESIUM: Magnesium: 1.7 mg/dL (ref 1.7–2.4)

## 2017-02-17 MED ORDER — ATORVASTATIN CALCIUM 40 MG PO TABS: 40.0000 mg | ORAL_TABLET | Freq: Every evening | ORAL | 0 refills | Status: DC

## 2017-02-17 MED ORDER — CYANOCOBALAMIN 1000 MCG/ML IJ SOLN: 1000.0000 ug | INTRAMUSCULAR | 0 refills | Status: AC

## 2017-02-17 MED ORDER — OMEPRAZOLE 20 MG PO CPDR: 20.0000 mg | DELAYED_RELEASE_CAPSULE | Freq: Every evening | ORAL | 0 refills | Status: AC

## 2017-02-17 MED ORDER — VITAMIN D (ERGOCALCIFEROL) 1.25 MG (50000 UNIT) PO CAPS: 50000.0000 [IU] | ORAL_CAPSULE | ORAL | 0 refills | Status: AC

## 2017-02-17 MED ORDER — APIXABAN 5 MG PO TABS
5.0000 mg | ORAL_TABLET | Freq: Two times a day (BID) | ORAL | 0 refills | Status: DC
Start: 2017-02-17 — End: 2017-03-01

## 2017-02-17 MED ORDER — HYDROCORTISONE 2.5 % RE CREA: TOPICAL_CREAM | Freq: Three times a day (TID) | RECTAL | 0 refills | Status: DC

## 2017-02-17 MED ORDER — TAMSULOSIN HCL 0.4 MG PO CAPS: 0.4000 mg | ORAL_CAPSULE | Freq: Every day | ORAL | 0 refills | Status: AC

## 2017-02-17 MED ORDER — GABAPENTIN 300 MG PO CAPS: 600.0000 mg | ORAL_CAPSULE | Freq: Three times a day (TID) | ORAL | 0 refills | Status: AC

## 2017-02-17 MED ORDER — PHENAZOPYRIDINE HCL 100 MG PO TABS
100.0000 mg | ORAL_TABLET | Freq: Three times a day (TID) | ORAL | 0 refills | Status: DC
Start: 2017-02-17 — End: 2017-02-24

## 2017-02-17 MED ORDER — POTASSIUM CHLORIDE ER 20 MEQ PO TBCR: 20.0000 meq | EXTENDED_RELEASE_TABLET | Freq: Every day | ORAL | 0 refills | Status: DC

## 2017-02-17 MED ORDER — FUROSEMIDE 40 MG PO TABS: 40.0000 mg | ORAL_TABLET | Freq: Every day | ORAL | 0 refills | Status: DC

## 2017-02-17 MED ORDER — AMOXICILLIN-POT CLAVULANATE 875-125 MG PO TABS: 1.0000 | ORAL_TABLET | Freq: Two times a day (BID) | ORAL | 0 refills | Status: DC

## 2017-02-17 MED ORDER — SERTRALINE HCL 100 MG PO TABS: 100.0000 mg | ORAL_TABLET | Freq: Every day | ORAL | 0 refills | Status: AC

## 2017-02-17 MED ORDER — DILTIAZEM HCL ER COATED BEADS 120 MG PO CP24: 120.0000 mg | ORAL_CAPSULE | Freq: Every day | ORAL | 0 refills | Status: DC

## 2017-02-17 NOTE — Discharge Summary (Signed)
PATIENT DETAILS Name: Paul Valdez Age: 71 y.o. Sex: male Date of Birth: 02-12-1946 MRN: [id number]. Admitting Physician: Waldemar Dickens, MD NWG:[id number] W, MD  Admit Date: 02/15/2017 Discharge date: 02/17/2017  Recommendations for Outpatient Follow-up:  1. Follow up with PCP in 1-2 weeks 2. Please obtain LFTs/BMP/CBC in one week 3. Please ensure follow-up with CT surgery and oncology 4. Reassess need for home oxygen at next visit with PCP 5. Please follow blood cultures till final-negative so far.  Admitted From:  SNF  Disposition: Home with home health services   Home Health: Yes (refuses to go back to SNF-being discharged with home health services)  Equipment/Devices: Oxygen 3L/m  Discharge Condition: Stable  CODE STATUS: Partial Code (DNI)  Diet recommendation:  Heart Healthy  Brief Summary: See H&P, Labs, Consult and Test reports for all details in brief, patient is a 71 year old male who underwent lung lobectomy recently and was discharged on 2/16, readmitted on 2/19 with shortness of breath-he was thought to have healthcare associated pneumonia and UTI. He was subsequently admitted for further evaluation and treatment. See below for further details.  Brief Hospital Course: Acute approximate respiratory failure secondary to healthcare associated pneumonia and acute diastolic heart failure: Acute respiratory failure has improved significantly. Patient feels much better than he did on admission. Leukocytosis has resolved, he has been afebrile. Her cultures negative, urine culture positive for 50,000 colonies of Escherichia coli. He was maintained on empiric broad-spectrum antibiotics (vancomycin/cefepime) since admission, since he is clinically improved-he will be transitioned to Augmentin on discharge. Per RN staff, patient desaturates off oxygen-and will be discharged home on 3 L of oxygen. I have asked him to follow-up with his PCP reassessment regarding  oxygen needs in the next few weeks. Please note CT angiogram of the chest was negative for pulmonary embolism.  Acute on chronic diastolic heart failure: This was managed with intravenous Lasix-this has been converted to oral Lasix at the time of discharge. More than 4 L negative balance, weight down 189 pounds (203 pounds on admission).Please reassess volume status and electrolyte panel at next follow-up with PCP.  Minimally elevated transaminases: Very minimal/mild-stable for outpatient follow-up with PCP.  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 Per patient, CT surgery in the process of arranging outpatient oncology follow-up.I have also called the cancer center and left a message with the new patient coordinator.  Mild hematochezia: Resolved, thought to be secondary to hemorrhoids. Continue Anusol on discharge.  Paroxysmal atrial fibrillation: Rate controlled, continue Cardizem and Eliquis.  Dyslipidemia: Continue statin  Depression: Stable without any suicidal/homicidal ideation-continue Zoloft  Peripheral neuropathy: Stable-continue Neurontin  GERD: Continue PPI  Procedures/Studies: None  Discharge Diagnoses:  Active Problems:   Carotid artery disease (HCC)   Essential hypertension   Hyperlipidemia   S/P lobectomy of lung   Atrial fibrillation (HCC)   Squamous cell carcinoma lung, left (HCC)   Acute on chronic diastolic congestive heart failure (HCC)   Complicated UTI (urinary tract infection)   Left rib fracture   Elevated brain natriuretic peptide (BNP) level   HCAP (healthcare-associated pneumonia)   Lobar pneumonia ([place])   Discharge Instructions:  Activity:  As tolerated with Full fall precautions use walker/cane & assistance as needed  Discharge Instructions    (HEART FAILURE PATIENTS) Call MD:  Anytime you have any of the following symptoms: 1) 3 pound weight gain in 24 hours or 5 pounds in 1 week 2) shortness of breath, with  or without a  dry hacking cough 3) swelling in the hands, feet or stomach 4) if you have to sleep on extra pillows at night in order to breathe.    Complete by:  As directed    Call MD for:  redness, tenderness, or signs of infection (pain, swelling, redness, odor or green/yellow discharge around incision site)    Complete by:  As directed    Call MD for:  severe uncontrolled pain    Complete by:  As directed    Diet - low sodium heart healthy    Complete by:  As directed    Discharge instructions    Complete by:  As directed    Follow with Primary MD  Gara Kroner, MD in 1 week  Please get a referral from your Primary MD or CT Surgery MD to a Oncologist.   and other consultant's as instructed your Hospitalist MD  You are being discharged with Home Oxygen-please ask your Primary MD to re-assess need for home Oxygen at your next visit.  Please get a complete blood count and chemistry panel checked by your Primary MD at your next visit, and again as instructed by your Primary MD.  Get Medicines reviewed and adjusted: Please take all your medications with you for your next visit with your Primary MD  Laboratory/radiological data: Please request your Primary MD to go over all hospital tests and procedure/radiological results at the follow up, please ask your Primary MD to get all Hospital records sent to his/her office.  In some cases, they will be blood work, cultures and biopsy results pending at the time of your discharge. Please request that your primary care M.D. follows up on these results.  Also Note the following: If you experience worsening of your admission symptoms, develop shortness of breath, life threatening emergency, suicidal or homicidal thoughts you must seek medical attention immediately by calling 911 or calling your MD immediately  if symptoms less severe.  You must read complete instructions/literature along with all the possible adverse reactions/side effects for all  the Medicines you take and that have been prescribed to you. Take any new Medicines after you have completely understood and accpet all the possible adverse reactions/side effects.   Do not drive when taking Pain medications or sleeping medications (Benzodaizepines)  Do not take more than prescribed Pain, Sleep and Anxiety Medications. It is not advisable to combine anxiety,sleep and pain medications without talking with your primary care practitioner  Special Instructions: If you have smoked or chewed Tobacco  in the last 2 yrs please stop smoking, stop any regular Alcohol  and or any Recreational drug use.  Wear Seat belts while driving.  Please note: You were cared for by a hospitalist during your hospital stay. Once you are discharged, your primary care physician will handle any further medical issues. Please note that NO REFILLS for any discharge medications will be authorized once you are discharged, as it is imperative that you return to your primary care physician (or establish a relationship with a primary care physician if you do not have one) for your post hospital discharge needs so that they can reassess your need for medications and monitor your lab values.   Increase activity slowly    Complete by:  As directed      Allergies as of 02/17/2017      Reactions   Colchicine Diarrhea      Medication List    STOP taking these medications   BISCOLAX 10 MG suppository Generic  drug:  bisacodyl   magnesium hydroxide 400 MG/5ML suspension Commonly known as:  MILK OF MAGNESIA   RA SALINE ENEMA RE   traMADol 50 MG tablet Commonly known as:  ULTRAM     TAKE these medications   amoxicillin-clavulanate 875-125 MG tablet Commonly known as:  AUGMENTIN Take 1 tablet by mouth 2 (two) times daily.   apixaban 5 MG Tabs tablet Commonly known as:  ELIQUIS Take 1 tablet (5 mg total) by mouth 2 (two) times daily.   atorvastatin 40 MG tablet Commonly known as:  LIPITOR Take 1 tablet  (40 mg total) by mouth every evening.   cyanocobalamin 1000 MCG/ML injection Commonly known as:  (VITAMIN B-12) Inject 1 mL (1,000 mcg total) into the skin every 30 (thirty) days. Please go to your [facility] MD's office for this injection What changed:  how to take this  additional instructions   diltiazem 120 MG 24 hr capsule Commonly known as:  CARDIZEM CD Take 1 capsule (120 mg total) by mouth daily.   furosemide 40 MG tablet Commonly known as:  LASIX Take 1 tablet (40 mg total) by mouth daily.   gabapentin 300 MG capsule Commonly known as:  NEURONTIN Take 2 capsules (600 mg total) by mouth 3 (three) times daily.   hydrocortisone 2.5 % rectal cream Commonly known as:  ANUSOL-HC Place rectally 3 (three) times daily. For 5 days only.   omeprazole 20 MG capsule Commonly known as:  PRILOSEC Take 1 capsule (20 mg total) by mouth every evening.   phenazopyridine 100 MG tablet Commonly known as:  PYRIDIUM Take 1 tablet (100 mg total) by mouth 3 (three) times daily with meals.   Potassium Chloride ER 20 MEQ Tbcr Take 20 mEq by mouth daily.   sertraline 100 MG tablet Commonly known as:  ZOLOFT Take 1 tablet (100 mg total) by mouth daily.   tamsulosin 0.4 MG Caps capsule Commonly known as:  FLOMAX Take 1 capsule (0.4 mg total) by mouth daily after supper.   Vitamin D (Ergocalciferol) 50000 units Caps capsule Commonly known as:  DRISDOL Take 1 capsule (50,000 Units total) by mouth every 7 (seven) days. Every Friday            Durable Medical Equipment        Start     Ordered   02/17/17 0751  For home use only DME Walker rolling  Once    Question:  Patient needs a walker to treat with the following condition  Answer:  Physical deconditioning   02/17/17 0750   02/17/17 0751  For home use only DME oxygen  Once    Question Answer Comment  Mode or (Route) Nasal cannula   Liters per Minute 3   Frequency Continuous (stationary and portable oxygen unit needed)     Oxygen conserving device Yes   Oxygen delivery system Gas      02/17/17 0750     Follow-up Information    Antony Contras W, MD. Schedule an appointment as soon as possible for a visit in 1 week(s).   Specialty:  Family Medicine Contact information: [place] [address] [phone]        Eilleen Kempf., MD. Schedule an appointment as soon as possible for a visit in 2 week(s).   Specialty:  Oncology Contact information: [place] [address] [phone]        Bryan K Bartle, MD Follow up on 02/24/2017.   Specialty:  Cardiothoracic Surgery  Why:  appointment at 3:30 pm Contact information: [address] [place] [address] [phone]          Allergies  Allergen Reactions  . Colchicine Diarrhea    Consultations: CTVS  Other Procedures/Studies: Dg Chest 2 View  Result Date: 02/09/2017 CLINICAL DATA:  Lung surgery. EXAM: CHEST  2 VIEW COMPARISON:  02/08/2017 FINDINGS: Mediastinum stable. Heart size stable Postsurgical changes left lung. Interim partial clearing of left lower lobe atelectasis and infiltrates. Persistent low lung volumes. Sliding hiatal hernia. Stable elevation right hemidiaphragm . Diffuse osteopenia degenerative change thoracic spine. IMPRESSION: Postsurgical changes left lung. Partial clearing of left lower lobe atelectasis and infiltrate. Persistent low lung volumes. Electronically Signed   By: [name]  Register   On: 02/09/2017 08:22   Dg Chest 2 View  Result Date: 01/29/2017 CLINICAL DATA:  Left lung nodule EXAM: CHEST  2 VIEW COMPARISON:  PET CT 01/04/2017 FINDINGS: Again noted is the left upper lobe mass measuring 4.3 cm, similar to prior PET CT, likely enlarging since prior chest x-ray. Eventration of the right hemidiaphragm noted. No other focal airspace opacity. No effusion. Large hiatal hernia. Heart is normal size. IMPRESSION: Left upper lobe mass again noted.  Electronically Signed   By: Rolm Baptise M.D.   On: 01/29/2017 09:39   Ct Angio Chest Pe W Or Wo Contrast  Result Date: 02/15/2017 CLINICAL DATA:  Lung cancer, CHF, possible health care associated pneumonia, LEFT lower extremity swelling acute onset of shortness of breath/dyspnea question pulmonary embolism ; history hypertension, coronary artery disease post PTCA, asthma, GERD, former smoker, atrial fibrillation EXAM: CT ANGIOGRAPHY CHEST WITH CONTRAST TECHNIQUE: Multidetector CT imaging of the chest was performed using the standard protocol during bolus administration of intravenous contrast. Multiplanar CT image reconstructions and MIPs were obtained to evaluate the vascular anatomy. CONTRAST:  100 cc Isovue 370 IV COMPARISON:  PET-CT 01/04/2017 FINDINGS: Cardiovascular: Atherosclerotic calcifications aorta and minimally in coronary arteries. Aneurysmal dilatation ascending thoracic aorta 4.3 cm transverse image 45. Scattered beam hardening artifacts from dense contrast in the SVC. Pulmonary arteries well opacified and patent. No evidence of pulmonary embolism. No pericardial effusion. Enlargement of cardiac chambers. Mediastinum/Nodes: Large hiatal hernia. Base of cervical region normal appearance. No thoracic adenopathy. Lungs/Pleura: Post LEFT upper lobectomy LEFT pleural thickening/fluid in the LEFT hemithorax. Partial atelectasis of LEFT lower lobe. Peripheral atelectasis and a few subpleural blebs in RIGHT lung. No definite infiltrate or pneumothorax. Significant elevation of RIGHT diaphragm. Upper Abdomen: Unremarkable Musculoskeletal: Diffuse osseous demineralization. Review of the MIP images confirms the above findings. IMPRESSION: No evidence of pulmonary embolism. Scattered atelectasis RIGHT lung with postsurgical changes in LEFT lung post LEFT upper lobectomy. Hiatal hernia. Aortic atherosclerosis and coronary arterial calcification with aneurysmal dilatation of the ascending thoracic aorta, 4.3  cm transverse, recommendation below. Recommend annual imaging followup by CTA or MRA. This recommendation follows 2010 ACCF/AHA/AATS/ACR/ASA/SCA/SCAI/SIR/STS/SVM Guidelines for the Diagnosis and Management of Patients with Thoracic Aortic Disease. Circulation. 2010; 121: O130-Q657 Electronically Signed   By: Lavonia Dana M.D.   On: 02/15/2017 11:37   Dg Chest Port 1 View  Result Date: 02/15/2017 CLINICAL DATA:  71 year old male with fever and shortness of breath. EXAM: PORTABLE CHEST 1 VIEW COMPARISON:  Chest radiograph dated 02/09/2017 FINDINGS: There is low lung volumes. There progression of airspace density involving the left mid to lower lung fields concerning for developing infiltrate. Right mid and lower lung field interstitial prominence and streaky densities noted. There is no significant pleural effusion. No  pneumothorax. There is silhouetting of the cardiac borders. Multiple surgical clips noted in the left hilar and suprahilar region. There is minimally displaced fracture of the lateral left fifth rib which appears new compared to prior study. Correlation with point tenderness recommended. IMPRESSION: 1. Increased left mid for lower lung field airspace opacity concerning for developing pneumonia versus pulmonary contusion. Right mid to lower lung field interstitial prominence and streaky densities may represent atelectasis versus infiltrate. 2. Minimally displaced fracture of the lateral aspect of the left fifth rib, new from prior study. No pneumothorax. Electronically Signed   By: Anner Crete M.D.   On: 02/15/2017 04:10   Dg Chest Port 1 View  Result Date: 02/08/2017 CLINICAL DATA:  Shortness of breath. History of left upper lobectomy on February 01, 2017 EXAM: PORTABLE CHEST 1 VIEW COMPARISON:  PET-CT study of January 14, 2017 and chest x-ray of February 07, 2017 FINDINGS: The lung volumes remain low with elevation of the right hemidiaphragm. There is persistent increased interstitial density  in the mid and lower left lung and at the right lung base. There are probable posterior layering pleural effusions bilaterally. The cardiac silhouette remains enlarged. The central pulmonary vascularity is prominent. There surgical clips in the left axillary region. The observed bony thorax exhibits no acute abnormality. IMPRESSION: Persistent elevation of the right hemidiaphragm. Persistent bilateral interstitial and mild alveolar opacities compatible with pneumonia. Stable cardiomegaly without definite pulmonary edema. Electronically Signed   By: David  Martinique M.D.   On: 02/08/2017 07:19   Dg Chest Port 1 View  Result Date: 02/07/2017 CLINICAL DATA:  Atelectasis EXAM: PORTABLE CHEST 1 VIEW COMPARISON:  02/06/2017 FINDINGS: Diffuse left lung airspace disease and right basilar atelectasis or infiltrate again noted, not significantly changed. Small bilateral effusions suspected. Mild cardiomegaly. IMPRESSION: No significant change since prior study. Electronically Signed   By: Rolm Baptise M.D.   On: 02/07/2017 07:05   Dg Chest Port 1 View  Result Date: 02/06/2017 CLINICAL DATA:  Post lobectomy of lung EXAM: PORTABLE CHEST 1 VIEW COMPARISON:  02/05/2017 FINDINGS: Interval removal of right internal jugular central line. Very low lung volumes. Worsening diffuse airspace disease throughout the left lung and right basilar airspace disease. No visible pneumothorax. IMPRESSION: Worsening bilateral airspace disease, diffuse on the left and in the right lung base. Electronically Signed   By: Rolm Baptise M.D.   On: 02/06/2017 08:07   Dg Chest Port 1 View  Result Date: 02/05/2017 CLINICAL DATA:  Status post left upper lobectomy for lung mass. Atelectasis. EXAM: PORTABLE CHEST 1 VIEW COMPARISON:  February 04, 2017 FINDINGS: Chest tube removed on the left. No evident pneumothorax. Central catheter tip is in superior vena cava. There is postoperative change on the left. The previously noted left pleural effusion is  only minimally apparent currently. There is patchy atelectasis in both lung bases. There is a degree of mild underlying interstitial edema. Heart is upper normal in size. The pulmonary vascular is within normal limits. Note that there is a degree of elevation of the right hemidiaphragm, stable. No evident adenopathy. There is degenerative change in each shoulder. IMPRESSION: No pneumothorax. Slight interstitial edema. Minimal left pleural effusion. Patchy bibasilar atelectasis. No airspace consolidation. Stable cardiac silhouette. Electronically Signed   By: Lowella Grip III M.D.   On: 02/05/2017 08:48   Dg Chest Port 1 View  Result Date: 02/04/2017 CLINICAL DATA:  Chest tube, post LEFT upper lobectomy EXAM: PORTABLE CHEST 1 VIEW COMPARISON:  Portable exam 0546 hours compared to  02/03/2015 FINDINGS: Interval removal of 1 of 2 LEFT thoracostomy tubes. RIGHT jugular central venous catheter with tip projecting over SVC. Rotation to the RIGHT. Enlargement of cardiac silhouette with slight vascular congestion. Decreased lung volumes with bibasilar atelectasis. Small LEFT pleural effusion at apex and base. No pneumothorax. IMPRESSION: Bibasilar atelectasis and small LEFT pleural effusion. No pneumothorax following removal of 1 of 2 LEFT thoracostomy tubes. Electronically Signed   By: Lavonia Dana M.D.   On: 02/04/2017 08:25   Dg Chest Port 1 View  Result Date: 02/03/2017 CLINICAL DATA:  Chest tube, post lobectomy, shortness of Breath EXAM: PORTABLE CHEST 1 VIEW COMPARISON:  02/02/2017 FINDINGS: Very low lung volumes. Two left chest tubes and right central line remain in place, unchanged. No pneumothorax. Cardiomegaly with bilateral airspace disease. No acute bony abnormality. IMPRESSION: Cardiomegaly, diffuse bilateral airspace disease with very low lung volumes. No change since prior study. Electronically Signed   By: Rolm Baptise M.D.   On: 02/03/2017 08:32   Dg Chest Port 1 View  Result Date:  02/02/2017 CLINICAL DATA:  Status post left upper lobectomy yesterday EXAM: PORTABLE CHEST 1 VIEW COMPARISON:  Portable chest x-ray of February 01, 2017 FINDINGS: The tiny left apical pneumothorax is not clearly evident today. The 2 chest tubes remain present with their tips in the pulmonary apex on the left. The right lung remains hypoinflated. The interstitial markings of both lungs remain coarse. The cardiac silhouette is enlarged but indistinct. The pulmonary vascularity is indistinct. IMPRESSION: Persistent bilateral hypoinflation with interstitial edema or less likely pneumonia. Right basilar atelectasis is suspected. No large pleural effusion. The left-sided pneumothorax is not clearly visible today. Stable cardiomegaly with mild central pulmonary vascular prominence. Electronically Signed   By: David  Martinique M.D.   On: 02/02/2017 07:34   Dg Chest Port 1 View  Result Date: 02/01/2017 CLINICAL DATA:  Status post left upper lobectomy today EXAM: PORTABLE CHEST 1 VIEW COMPARISON:  PA and lateral chest x-ray of January 29, 2017 FINDINGS: The lung volumes are low. There is bibasilar atelectasis. There is a small left apical pneumothorax amounting to 5% or less of the lung volume. The 2 left-sided chest tubes have their tips in the left apex. There is subcutaneous emphysema in the left axillary region. The cardiac silhouette is mildly enlarged and indistinct. The pulmonary vascularity is mildly engorged. The right internal jugular venous catheter tip projects over the midportion of the SVC. There are surgical clips in the left hilar region. IMPRESSION: Mild hypoinflation greatest on the right. Bibasilar atelectasis or less likely infiltrate. Tiny left apical pneumothorax. There are 2 chest tubes with their tips in the apex. Electronically Signed   By: David  Martinique M.D.   On: 02/01/2017 15:15   Dg Swallowing Func-speech Pathology  Result Date: 02/06/2017 Objective Swallowing Evaluation: Type of Study: Bedside  Swallow Evaluation Patient Details Name: Paul Valdez MRN: [id number] Date of Birth: 10-May-1946 Today's Date: 02/06/2017 Time: SLP Start Time (ACUTE ONLY): 1400-SLP Stop Time (ACUTE ONLY): 1430 SLP Time Calculation (min) (ACUTE ONLY): 30 min Past Medical History: Past Medical History: Diagnosis Date . Arthritis  . Asthma  . Atypical chest pain   a. Normal nuc 2014. . Carotid artery disease ([place])   a. Carotid duplex 2015: 22-02% RICA, 5-42% LICA.  Marland Kitchen Cataract  . Coronary artery calcification seen on CT scan  . Depression  . Detached retina  . Former consumption of alcohol  . Former tobacco use  . GERD (gastroesophageal reflux disease)   "  I take heart burn medicine" . Glaucoma  . Hyperlipemia  . Hypertension  . Low back pain 12/29/2016 . Mass of upper lobe of left lung 12/29/2016 . PVD (peripheral vascular disease) ([place])   a. Mild plaque of iliacs in 2013 on duplex; PET 2018:  PET also corroborated coronary, aortic arch, and branch vessel atherosclerotic vascular disease as well as aortoiliac atherosclerotic vascular disease. . Sleep apnea   has not gotten CPAP yet Past Surgical History: Past Surgical History: Procedure Laterality Date . APPENDECTOMY   . COLONOSCOPY   . EYE SURGERY   . FLEXIBLE BRONCHOSCOPY N/A 02/01/2017  Procedure: FLEXIBLE BRONCHOSCOPY;  Surgeon: Gaye Pollack, MD;  Location: [facility] OR;  Service: Thoracic;  Laterality: N/A; . GANGLION CYST EXCISION    left hand . HERNIA REPAIR    double hernia repair . THORACOTOMY/LOBECTOMY Left 02/01/2017  Procedure: THORACOTOMY/LEFT UPPER LOBECTOMY;  Surgeon: Gaye Pollack, MD;  Location: [facility] OR;  Service: Thoracic;  Laterality: Left; . 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 Undergoing back pain workup for spine surgery and a 4cm LUL lung mass was discovered. He then presented to [facility] 02/05 for elective left upper lobectomy. The procedure was successful without  complication and he was extubated post-opertively, however, several hours later he developed lethargy and was minimally responsive. ABG was done and demonstrated severe respiratory acidosis and he was started on BiPAP. Currently on HFNC. CXR today showed Worsening bilateral airspace disease, diffuse on the left and in theright lung base. Subjective: upright in chair, RN present Assessment / Plan / Recommendation CHL IP CLINICAL IMPRESSIONS 02/06/2017 Therapy Diagnosis WFL Clinical Impression Patient presents with oropharyngeal swallow which appears grossly within functional limits. No aspiration observed. Noted intermittent pentration due to delayed swallow initiation at pyriform sinuses with thin liquids (transient) and with mixed consistency (thin liquid with barium tablet). Oral phase characterized by adequate mastication and anterior to posterior transit. Pharyngeal phase with adequate tongue base retraction, hyolaryngeal excursion and pharyngeal constriction. Epiglottic movement complete. No abnormal residual remains in the oral cavity or pharynx after the swallow. Recommend regular diet with thin liquids, medications whole in puree. SLP will f/u for tolerance, education regarding general aspiration precautions due to patient's noted impulsivity with liquids. Impact on safety and function Mild aspiration risk   CHL IP TREATMENT RECOMMENDATION 02/06/2017 Treatment Recommendations Therapy as outlined in treatment plan below   Prognosis 02/06/2017 Prognosis for Safe Diet Advancement Good Barriers to Reach Goals -- Barriers/Prognosis Comment -- CHL IP DIET RECOMMENDATION 02/06/2017 SLP Diet Recommendations Regular solids;Thin liquid Liquid Administration via Cup;Straw Medication Administration Whole meds with puree Compensations Slow rate;Small sips/bites Postural Changes Seated upright at 90 degrees   CHL IP OTHER RECOMMENDATIONS 02/06/2017 Recommended Consults -- Oral Care Recommendations Oral care BID Other  Recommendations --   CHL IP FOLLOW UP RECOMMENDATIONS 02/06/2017 Follow up Recommendations Other (comment)   CHL IP FREQUENCY AND DURATION 02/06/2017 Speech Therapy Frequency (ACUTE ONLY) min 1 x/week Treatment Duration 1 week      CHL IP ORAL PHASE 02/06/2017 Oral Phase WFL Oral - Pudding Teaspoon -- Oral - Pudding Cup -- Oral - Honey Teaspoon -- Oral - Honey Cup -- Oral - Nectar Teaspoon -- Oral - Nectar Cup -- Oral - Nectar Straw -- Oral - Thin Teaspoon -- Oral - Thin Cup -- Oral - Thin Straw -- Oral - Puree -- Oral -  Mech Soft -- Oral - Regular -- Oral - Multi-Consistency -- Oral - Pill -- Oral Phase - Comment --  CHL IP PHARYNGEAL PHASE 02/06/2017 Pharyngeal Phase Impaired Pharyngeal- Pudding Teaspoon -- Pharyngeal -- Pharyngeal- Pudding Cup -- Pharyngeal -- Pharyngeal- Honey Teaspoon -- Pharyngeal -- Pharyngeal- Honey Cup -- Pharyngeal -- Pharyngeal- Nectar Teaspoon -- Pharyngeal -- Pharyngeal- Nectar Cup -- Pharyngeal -- Pharyngeal- Nectar Straw -- Pharyngeal -- Pharyngeal- Thin Teaspoon -- Pharyngeal -- Pharyngeal- Thin Cup -- Pharyngeal -- Pharyngeal- Thin Straw -- Pharyngeal -- Pharyngeal- Puree -- Pharyngeal -- Pharyngeal- Mechanical Soft -- Pharyngeal -- Pharyngeal- Regular -- Pharyngeal -- Pharyngeal- Multi-consistency -- Pharyngeal -- Pharyngeal- Pill -- Pharyngeal -- Pharyngeal Comment --  CHL IP CERVICAL ESOPHAGEAL PHASE 02/06/2017 Cervical Esophageal Phase WFL Pudding Teaspoon -- Pudding Cup -- Honey Teaspoon -- Honey Cup -- Nectar Teaspoon -- Nectar Cup -- Nectar Straw -- Thin Teaspoon -- Thin Cup -- Thin Straw -- Puree -- Mechanical Soft -- Regular -- Multi-consistency -- Pill -- Cervical Esophageal Comment -- Deneise Lever, MS CF-SLP Speech-Language Pathologist [phone] No flowsheet data found. Aliene Altes 02/06/2017, 3:45 PM                 TODAY-DAY OF DISCHARGE:  Subjective:   Gurnoor Ursua today has no headache,no chest abdominal pain,no new weakness tingling or numbness, feels much  better wants to go home today.  Objective:   Blood pressure 125/78, pulse (!) 107, temperature 97.8 F (36.6 C), temperature source Oral, resp. rate 18, height _0  (1.778 m), weight 85.8 kg (189 lb 1.6 oz), SpO2 93 %.  Intake/Output Summary (Last 24 hours) at 02/17/17 0803 Last data filed at 02/17/17 0501  Gross per 24 hour  Intake             1440 ml  Output             4800 ml  Net            -3360 ml   Filed Weights   02/15/17 0314 02/16/17 0459 02/17/17 0501  Weight: 92.1 kg (203 lb) 90.5 kg (199 lb 9.6 oz) 85.8 kg (189 lb 1.6 oz)    Exam: Awake Alert, Oriented *3, No new F.N deficits, Normal affect Joiner.AT,PERRAL Supple Neck,No JVD, No cervical lymphadenopathy appriciated.  Symmetrical Chest wall movement, Good air movement bilaterally, CTAB RRR,No Gallops,Rubs or new Murmurs, No Parasternal Heave +ve B.Sounds, Abd Soft, Non tender, No organomegaly appriciated, No rebound -guarding or rigidity. No Cyanosis, Clubbing or edema, No new Rash or bruise   PERTINENT RADIOLOGIC STUDIES: Dg Chest 2 View  Result Date: 02/09/2017 CLINICAL DATA:  Lung surgery. EXAM: CHEST  2 VIEW COMPARISON:  02/08/2017 FINDINGS: Mediastinum stable. Heart size stable Postsurgical changes left lung. Interim partial clearing of left lower lobe atelectasis and infiltrates. Persistent low lung volumes. Sliding hiatal hernia. Stable elevation right hemidiaphragm . Diffuse osteopenia degenerative change thoracic spine. IMPRESSION: Postsurgical changes left lung. Partial clearing of left lower lobe atelectasis and infiltrate. Persistent low lung volumes. Electronically Signed   By: [name]  Register   On: 02/09/2017 08:22   Dg Chest 2 View  Result Date: 01/29/2017 CLINICAL DATA:  Left lung nodule EXAM: CHEST  2 VIEW COMPARISON:  PET CT 01/04/2017 FINDINGS: Again noted is the left upper lobe mass measuring 4.3 cm, similar to prior PET CT, likely enlarging since prior chest x-ray. Eventration of the right  hemidiaphragm noted. No other focal airspace opacity. No effusion. Large hiatal hernia. Heart is normal size.  IMPRESSION: Left upper lobe mass again noted. Electronically Signed   By: Rolm Baptise M.D.   On: 01/29/2017 09:39   Ct Angio Chest Pe W Or Wo Contrast  Result Date: 02/15/2017 CLINICAL DATA:  Lung cancer, CHF, possible health care associated pneumonia, LEFT lower extremity swelling acute onset of shortness of breath/dyspnea question pulmonary embolism ; history hypertension, coronary artery disease post PTCA, asthma, GERD, former smoker, atrial fibrillation EXAM: CT ANGIOGRAPHY CHEST WITH CONTRAST TECHNIQUE: Multidetector CT imaging of the chest was performed using the standard protocol during bolus administration of intravenous contrast. Multiplanar CT image reconstructions and MIPs were obtained to evaluate the vascular anatomy. CONTRAST:  100 cc Isovue 370 IV COMPARISON:  PET-CT 01/04/2017 FINDINGS: Cardiovascular: Atherosclerotic calcifications aorta and minimally in coronary arteries. Aneurysmal dilatation ascending thoracic aorta 4.3 cm transverse image 45. Scattered beam hardening artifacts from dense contrast in the SVC. Pulmonary arteries well opacified and patent. No evidence of pulmonary embolism. No pericardial effusion. Enlargement of cardiac chambers. Mediastinum/Nodes: Large hiatal hernia. Base of cervical region normal appearance. No thoracic adenopathy. Lungs/Pleura: Post LEFT upper lobectomy LEFT pleural thickening/fluid in the LEFT hemithorax. Partial atelectasis of LEFT lower lobe. Peripheral atelectasis and a few subpleural blebs in RIGHT lung. No definite infiltrate or pneumothorax. Significant elevation of RIGHT diaphragm. Upper Abdomen: Unremarkable Musculoskeletal: Diffuse osseous demineralization. Review of the MIP images confirms the above findings. IMPRESSION: No evidence of pulmonary embolism. Scattered atelectasis RIGHT lung with postsurgical changes in LEFT lung post LEFT  upper lobectomy. Hiatal hernia. Aortic atherosclerosis and coronary arterial calcification with aneurysmal dilatation of the ascending thoracic aorta, 4.3 cm transverse, recommendation below. Recommend annual imaging followup by CTA or MRA. This recommendation follows 2010 ACCF/AHA/AATS/ACR/ASA/SCA/SCAI/SIR/STS/SVM Guidelines for the Diagnosis and Management of Patients with Thoracic Aortic Disease. Circulation. 2010; 121: E938-B017 Electronically Signed   By: Lavonia Dana M.D.   On: 02/15/2017 11:37   Dg Chest Port 1 View  Result Date: 02/15/2017 CLINICAL DATA:  71 year old male with fever and shortness of breath. EXAM: PORTABLE CHEST 1 VIEW COMPARISON:  Chest radiograph dated 02/09/2017 FINDINGS: There is low lung volumes. There progression of airspace density involving the left mid to lower lung fields concerning for developing infiltrate. Right mid and lower lung field interstitial prominence and streaky densities noted. There is no significant pleural effusion. No pneumothorax. There is silhouetting of the cardiac borders. Multiple surgical clips noted in the left hilar and suprahilar region. There is minimally displaced fracture of the lateral left fifth rib which appears new compared to prior study. Correlation with point tenderness recommended. IMPRESSION: 1. Increased left mid for lower lung field airspace opacity concerning for developing pneumonia versus pulmonary contusion. Right mid to lower lung field interstitial prominence and streaky densities may represent atelectasis versus infiltrate. 2. Minimally displaced fracture of the lateral aspect of the left fifth rib, new from prior study. No pneumothorax. Electronically Signed   By: Anner Crete M.D.   On: 02/15/2017 04:10   Dg Chest Port 1 View  Result Date: 02/08/2017 CLINICAL DATA:  Shortness of breath. History of left upper lobectomy on February 01, 2017 EXAM: PORTABLE CHEST 1 VIEW COMPARISON:  PET-CT study of January 14, 2017 and chest  x-ray of February 07, 2017 FINDINGS: The lung volumes remain low with elevation of the right hemidiaphragm. There is persistent increased interstitial density in the mid and lower left lung and at the right lung base. There are probable posterior layering pleural effusions bilaterally. The cardiac silhouette remains enlarged. The central pulmonary  vascularity is prominent. There surgical clips in the left axillary region. The observed bony thorax exhibits no acute abnormality. IMPRESSION: Persistent elevation of the right hemidiaphragm. Persistent bilateral interstitial and mild alveolar opacities compatible with pneumonia. Stable cardiomegaly without definite pulmonary edema. Electronically Signed   By: David  Martinique M.D.   On: 02/08/2017 07:19   Dg Chest Port 1 View  Result Date: 02/07/2017 CLINICAL DATA:  Atelectasis EXAM: PORTABLE CHEST 1 VIEW COMPARISON:  02/06/2017 FINDINGS: Diffuse left lung airspace disease and right basilar atelectasis or infiltrate again noted, not significantly changed. Small bilateral effusions suspected. Mild cardiomegaly. IMPRESSION: No significant change since prior study. Electronically Signed   By: Rolm Baptise M.D.   On: 02/07/2017 07:05   Dg Chest Port 1 View  Result Date: 02/06/2017 CLINICAL DATA:  Post lobectomy of lung EXAM: PORTABLE CHEST 1 VIEW COMPARISON:  02/05/2017 FINDINGS: Interval removal of right internal jugular central line. Very low lung volumes. Worsening diffuse airspace disease throughout the left lung and right basilar airspace disease. No visible pneumothorax. IMPRESSION: Worsening bilateral airspace disease, diffuse on the left and in the right lung base. Electronically Signed   By: Rolm Baptise M.D.   On: 02/06/2017 08:07   Dg Chest Port 1 View  Result Date: 02/05/2017 CLINICAL DATA:  Status post left upper lobectomy for lung mass. Atelectasis. EXAM: PORTABLE CHEST 1 VIEW COMPARISON:  February 04, 2017 FINDINGS: Chest tube removed on the left. No  evident pneumothorax. Central catheter tip is in superior vena cava. There is postoperative change on the left. The previously noted left pleural effusion is only minimally apparent currently. There is patchy atelectasis in both lung bases. There is a degree of mild underlying interstitial edema. Heart is upper normal in size. The pulmonary vascular is within normal limits. Note that there is a degree of elevation of the right hemidiaphragm, stable. No evident adenopathy. There is degenerative change in each shoulder. IMPRESSION: No pneumothorax. Slight interstitial edema. Minimal left pleural effusion. Patchy bibasilar atelectasis. No airspace consolidation. Stable cardiac silhouette. Electronically Signed   By: Lowella Grip III M.D.   On: 02/05/2017 08:48   Dg Chest Port 1 View  Result Date: 02/04/2017 CLINICAL DATA:  Chest tube, post LEFT upper lobectomy EXAM: PORTABLE CHEST 1 VIEW COMPARISON:  Portable exam 0546 hours compared to 02/03/2015 FINDINGS: Interval removal of 1 of 2 LEFT thoracostomy tubes. RIGHT jugular central venous catheter with tip projecting over SVC. Rotation to the RIGHT. Enlargement of cardiac silhouette with slight vascular congestion. Decreased lung volumes with bibasilar atelectasis. Small LEFT pleural effusion at apex and base. No pneumothorax. IMPRESSION: Bibasilar atelectasis and small LEFT pleural effusion. No pneumothorax following removal of 1 of 2 LEFT thoracostomy tubes. Electronically Signed   By: Lavonia Dana M.D.   On: 02/04/2017 08:25   Dg Chest Port 1 View  Result Date: 02/03/2017 CLINICAL DATA:  Chest tube, post lobectomy, shortness of Breath EXAM: PORTABLE CHEST 1 VIEW COMPARISON:  02/02/2017 FINDINGS: Very low lung volumes. Two left chest tubes and right central line remain in place, unchanged. No pneumothorax. Cardiomegaly with bilateral airspace disease. No acute bony abnormality. IMPRESSION: Cardiomegaly, diffuse bilateral airspace disease with very low lung  volumes. No change since prior study. Electronically Signed   By: Rolm Baptise M.D.   On: 02/03/2017 08:32   Dg Chest Port 1 View  Result Date: 02/02/2017 CLINICAL DATA:  Status post left upper lobectomy yesterday EXAM: PORTABLE CHEST 1 VIEW COMPARISON:  Portable chest x-ray of February 01, 2017 FINDINGS: The tiny left apical pneumothorax is not clearly evident today. The 2 chest tubes remain present with their tips in the pulmonary apex on the left. The right lung remains hypoinflated. The interstitial markings of both lungs remain coarse. The cardiac silhouette is enlarged but indistinct. The pulmonary vascularity is indistinct. IMPRESSION: Persistent bilateral hypoinflation with interstitial edema or less likely pneumonia. Right basilar atelectasis is suspected. No large pleural effusion. The left-sided pneumothorax is not clearly visible today. Stable cardiomegaly with mild central pulmonary vascular prominence. Electronically Signed   By: David  Martinique M.D.   On: 02/02/2017 07:34   Dg Chest Port 1 View  Result Date: 02/01/2017 CLINICAL DATA:  Status post left upper lobectomy today EXAM: PORTABLE CHEST 1 VIEW COMPARISON:  PA and lateral chest x-ray of January 29, 2017 FINDINGS: The lung volumes are low. There is bibasilar atelectasis. There is a small left apical pneumothorax amounting to 5% or less of the lung volume. The 2 left-sided chest tubes have their tips in the left apex. There is subcutaneous emphysema in the left axillary region. The cardiac silhouette is mildly enlarged and indistinct. The pulmonary vascularity is mildly engorged. The right internal jugular venous catheter tip projects over the midportion of the SVC. There are surgical clips in the left hilar region. IMPRESSION: Mild hypoinflation greatest on the right. Bibasilar atelectasis or less likely infiltrate. Tiny left apical pneumothorax. There are 2 chest tubes with their tips in the apex. Electronically Signed   By: David  Martinique  M.D.   On: 02/01/2017 15:15   Dg Swallowing Func-speech Pathology  Result Date: 02/06/2017 Objective Swallowing Evaluation: Type of Study: Bedside Swallow Evaluation Patient Details Name: Paul Valdez MRN: [id number] Date of Birth: 11-09-1946 Today's Date: 02/06/2017 Time: SLP Start Time (ACUTE ONLY): 1400-SLP Stop Time (ACUTE ONLY): 1430 SLP Time Calculation (min) (ACUTE ONLY): 30 min Past Medical History: Past Medical History: Diagnosis Date . Arthritis  . Asthma  . Atypical chest pain   a. Normal nuc 2014. . Carotid artery disease ([place])   a. Carotid duplex 2015: 24-82% RICA, 5-00% LICA.  Marland Kitchen Cataract  . Coronary artery calcification seen on CT scan  . Depression  . Detached retina  . Former consumption of alcohol  . Former tobacco use  . GERD (gastroesophageal reflux disease)   "I take heart burn medicine" . Glaucoma  . Hyperlipemia  . Hypertension  . Low back pain 12/29/2016 . Mass of upper lobe of left lung 12/29/2016 . PVD (peripheral vascular disease) ([place])   a. Mild plaque of iliacs in 2013 on duplex; PET 2018:  PET also corroborated coronary, aortic arch, and branch vessel atherosclerotic vascular disease as well as aortoiliac atherosclerotic vascular disease. . Sleep apnea   has not gotten CPAP yet Past Surgical History: Past Surgical History: Procedure Laterality Date . APPENDECTOMY   . COLONOSCOPY   . EYE SURGERY   . FLEXIBLE BRONCHOSCOPY N/A 02/01/2017  Procedure: FLEXIBLE BRONCHOSCOPY;  Surgeon: Gaye Pollack, MD;  Location: [facility] OR;  Service: Thoracic;  Laterality: N/A; . GANGLION CYST EXCISION    left hand . HERNIA REPAIR    double hernia repair . THORACOTOMY/LOBECTOMY Left 02/01/2017  Procedure: THORACOTOMY/LEFT UPPER LOBECTOMY;  Surgeon: Gaye Pollack, MD;  Location: [facility] OR;  Service: Thoracic;  Laterality: Left; . 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 Undergoing back pain workup for spine surgery  and a 4cm  LUL lung mass was discovered. He then presented to [facility] 02/05 for elective left upper lobectomy. The procedure was successful without complication and he was extubated post-opertively, however, several hours later he developed lethargy and was minimally responsive. ABG was done and demonstrated severe respiratory acidosis and he was started on BiPAP. Currently on HFNC. CXR today showed Worsening bilateral airspace disease, diffuse on the left and in theright lung base. Subjective: upright in chair, RN present Assessment / Plan / Recommendation CHL IP CLINICAL IMPRESSIONS 02/06/2017 Therapy Diagnosis WFL Clinical Impression Patient presents with oropharyngeal swallow which appears grossly within functional limits. No aspiration observed. Noted intermittent pentration due to delayed swallow initiation at pyriform sinuses with thin liquids (transient) and with mixed consistency (thin liquid with barium tablet). Oral phase characterized by adequate mastication and anterior to posterior transit. Pharyngeal phase with adequate tongue base retraction, hyolaryngeal excursion and pharyngeal constriction. Epiglottic movement complete. No abnormal residual remains in the oral cavity or pharynx after the swallow. Recommend regular diet with thin liquids, medications whole in puree. SLP will f/u for tolerance, education regarding general aspiration precautions due to patient's noted impulsivity with liquids. Impact on safety and function Mild aspiration risk   CHL IP TREATMENT RECOMMENDATION 02/06/2017 Treatment Recommendations Therapy as outlined in treatment plan below   Prognosis 02/06/2017 Prognosis for Safe Diet Advancement Good Barriers to Reach Goals -- Barriers/Prognosis Comment -- CHL IP DIET RECOMMENDATION 02/06/2017 SLP Diet Recommendations Regular solids;Thin liquid Liquid Administration via Cup;Straw Medication Administration Whole meds with puree Compensations Slow rate;Small sips/bites Postural Changes Seated  upright at 90 degrees   CHL IP OTHER RECOMMENDATIONS 02/06/2017 Recommended Consults -- Oral Care Recommendations Oral care BID Other Recommendations --   CHL IP FOLLOW UP RECOMMENDATIONS 02/06/2017 Follow up Recommendations Other (comment)   CHL IP FREQUENCY AND DURATION 02/06/2017 Speech Therapy Frequency (ACUTE ONLY) min 1 x/week Treatment Duration 1 week      CHL IP ORAL PHASE 02/06/2017 Oral Phase WFL Oral - Pudding Teaspoon -- Oral - Pudding Cup -- Oral - Honey Teaspoon -- Oral - Honey Cup -- Oral - Nectar Teaspoon -- Oral - Nectar Cup -- Oral - Nectar Straw -- Oral - Thin Teaspoon -- Oral - Thin Cup -- Oral - Thin Straw -- Oral - Puree -- Oral - Mech Soft -- Oral - Regular -- Oral - Multi-Consistency -- Oral - Pill -- Oral Phase - Comment --  CHL IP PHARYNGEAL PHASE 02/06/2017 Pharyngeal Phase Impaired Pharyngeal- Pudding Teaspoon -- Pharyngeal -- Pharyngeal- Pudding Cup -- Pharyngeal -- Pharyngeal- Honey Teaspoon -- Pharyngeal -- Pharyngeal- Honey Cup -- Pharyngeal -- Pharyngeal- Nectar Teaspoon -- Pharyngeal -- Pharyngeal- Nectar Cup -- Pharyngeal -- Pharyngeal- Nectar Straw -- Pharyngeal -- Pharyngeal- Thin Teaspoon -- Pharyngeal -- Pharyngeal- Thin Cup -- Pharyngeal -- Pharyngeal- Thin Straw -- Pharyngeal -- Pharyngeal- Puree -- Pharyngeal -- Pharyngeal- Mechanical Soft -- Pharyngeal -- Pharyngeal- Regular -- Pharyngeal -- Pharyngeal- Multi-consistency -- Pharyngeal -- Pharyngeal- Pill -- Pharyngeal -- Pharyngeal Comment --  CHL IP CERVICAL ESOPHAGEAL PHASE 02/06/2017 Cervical Esophageal Phase WFL Pudding Teaspoon -- Pudding Cup -- Honey Teaspoon -- Honey Cup -- Nectar Teaspoon -- Nectar Cup -- Nectar Straw -- Thin Teaspoon -- Thin Cup -- Thin Straw -- Puree -- Mechanical Soft -- Regular -- Multi-consistency -- Pill -- Cervical Esophageal Comment -- Deneise Lever, MS CF-SLP Speech-Language Pathologist [phone] No flowsheet data found. Aliene Altes 02/06/2017, 3:45 PM  PERTINENT LAB  RESULTS: CBC:  Recent Labs  02/16/17 0419 02/17/17 0417  WBC 13.1* 7.1  HGB 9.2* 9.8*  HCT 28.8* 30.6*  PLT 316 365   CMET CMP     Component Value Date/Time   NA 137 02/17/2017 0417   K 3.6 02/17/2017 0417   CL 95 (L) 02/17/2017 0417   CO2 33 (H) 02/17/2017 0417   GLUCOSE 109 (H) 02/17/2017 0417   BUN 10 02/17/2017 0417   CREATININE 0.97 02/17/2017 0417   CALCIUM 8.5 (L) 02/17/2017 0417   PROT 6.0 (L) 02/17/2017 0417   ALBUMIN 2.3 (L) 02/17/2017 0417   AST 80 (H) 02/17/2017 0417   ALT 90 (H) 02/17/2017 0417   ALKPHOS 78 02/17/2017 0417   BILITOT 0.4 02/17/2017 0417   GFRNONAA >60 02/17/2017 0417   GFRAA >60 02/17/2017 0417    GFR Estimated Creatinine Clearance: 72.1 mL/min (by C-G formula based on SCr of 0.97 mg/dL). No results for input(s): LIPASE, AMYLASE in the last 72 hours. No results for input(s): CKTOTAL, CKMB, CKMBINDEX, TROPONINI in the last 72 hours. Invalid input(s): POCBNP No results for input(s): DDIMER in the last 72 hours. No results for input(s): HGBA1C in the last 72 hours. No results for input(s): CHOL, HDL, LDLCALC, TRIG, CHOLHDL, LDLDIRECT in the last 72 hours. No results for input(s): TSH, T4TOTAL, T3FREE, THYROIDAB in the last 72 hours.  Invalid input(s): FREET3 No results for input(s): VITAMINB12, FOLATE, FERRITIN, TIBC, IRON, RETICCTPCT in the last 72 hours. Coags: No results for input(s): INR in the last 72 hours.  Invalid input(s): PT Microbiology: Recent Results (from the past 240 hour(s))  Urine culture     Status: Abnormal   Collection Time: 02/10/17  4:51 PM  Result Value Ref Range Status   Specimen Description URINE, CLEAN CATCH  Final   Special Requests NONE  Final   Culture <10,000 COLONIES/mL INSIGNIFICANT GROWTH (A)  Final   Report Status 02/11/2017 FINAL  Final  Culture, blood (routine x 2)     Status: None (Preliminary result)   Collection Time: 02/15/17  4:00 AM  Result Value Ref Range Status   Specimen Description  BLOOD RIGHT HAND  Final   Special Requests BOTTLES DRAWN AEROBIC AND ANAEROBIC 5ML  Final   Culture NO GROWTH 1 DAY  Final   Report Status PENDING  Incomplete  Culture, blood (routine x 2)     Status: None (Preliminary result)   Collection Time: 02/15/17  4:15 AM  Result Value Ref Range Status   Specimen Description BLOOD LEFT HAND  Final   Special Requests AEROBIC BOTTLE ONLY 5ML  Final   Culture NO GROWTH 1 DAY  Final   Report Status PENDING  Incomplete  Culture, Urine     Status: Abnormal   Collection Time: 02/15/17  9:45 AM  Result Value Ref Range Status   Specimen Description URINE, RANDOM  Final   Special Requests NONE  Final   Culture 50,000 COLONIES/mL ESCHERICHIA COLI (A)  Final   Report Status 02/17/2017 FINAL  Final   Organism ID, Bacteria ESCHERICHIA COLI (A)  Final      Susceptibility   Escherichia coli - MIC*    AMPICILLIN <=2 SENSITIVE Sensitive     CEFAZOLIN <=4 SENSITIVE Sensitive     CEFTRIAXONE <=1 SENSITIVE Sensitive     CIPROFLOXACIN <=0.25 SENSITIVE Sensitive     GENTAMICIN <=1 SENSITIVE Sensitive     IMIPENEM <=0.25 SENSITIVE Sensitive     NITROFURANTOIN <=16 SENSITIVE Sensitive  TRIMETH/SULFA <=20 SENSITIVE Sensitive     AMPICILLIN/SULBACTAM <=2 SENSITIVE Sensitive     PIP/TAZO <=4 SENSITIVE Sensitive     Extended ESBL NEGATIVE Sensitive     * 50,000 COLONIES/mL ESCHERICHIA COLI  MRSA PCR Screening     Status: None   Collection Time: 02/16/17  7:19 AM  Result Value Ref Range Status   MRSA by PCR NEGATIVE NEGATIVE Final    Comment:        The GeneXpert MRSA Assay (FDA approved for NASAL specimens only), is one component of a comprehensive MRSA colonization surveillance program. It is not intended to diagnose MRSA infection nor to guide or monitor treatment for MRSA infections.     FURTHER DISCHARGE INSTRUCTIONS:  Get Medicines reviewed and adjusted: Please take all your medications with you for your next visit with your Primary  MD  Laboratory/radiological data: Please request your Primary MD to go over all hospital tests and procedure/radiological results at the follow up, please ask your Primary MD to get all Hospital records sent to his/her office.  In some cases, they will be blood work, cultures and biopsy results pending at the time of your discharge. Please request that your primary care M.D. goes through all the records of your hospital data and follows up on these results.  Also Note the following: If you experience worsening of your admission symptoms, develop shortness of breath, life threatening emergency, suicidal or homicidal thoughts you must seek medical attention immediately by calling 911 or calling your MD immediately  if symptoms less severe.  You must read complete instructions/literature along with all the possible adverse reactions/side effects for all the Medicines you take and that have been prescribed to you. Take any new Medicines after you have completely understood and accpet all the possible adverse reactions/side effects.   Do not drive when taking Pain medications or sleeping medications (Benzodaizepines)  Do not take more than prescribed Pain, Sleep and Anxiety Medications. It is not advisable to combine anxiety,sleep and pain medications without talking with your primary care practitioner  Special Instructions: If you have smoked or chewed Tobacco  in the last 2 yrs please stop smoking, stop any regular Alcohol  and or any Recreational drug use.  Wear Seat belts while driving.  Please note: You were cared for by a hospitalist during your hospital stay. Once you are discharged, your primary care physician will handle any further medical issues. Please note that NO REFILLS for any discharge medications will be authorized once you are discharged, as it is imperative that you return to your primary care physician (or establish a relationship with a primary care physician if you do not have  one) for your post hospital discharge needs so that they can reassess your need for medications and monitor your lab values.  Total Time spent coordinating discharge including counseling, education and face to face time equals 45 minutes.  SignedOren Binet 02/17/2017 8:03 AM

## 2017-02-17 NOTE — Care Management Note (Signed)
Case Management Note Marvetta Gibbons RN, BSN Unit 2W-Case Manager [phone]  Patient Details  Name: Paul Valdez MRN: [id number] Date of Birth: 1946-03-29  Subjective/Objective:  Pt readmitted with SOB from recent discharge on 2/16                  Action/Plan: PTA pt was at [facility] for rehab, CSW following for return to SNF when medically stable  Expected Discharge Date:  02/17/17               Expected Discharge Plan:  [place]  In-House Referral:  Clinical Social Work  Discharge planning Services  CM Consult  Post Acute Care Choice:  Home Health, Durable Medical Equipment Choice offered to:  Patient  DME Arranged:  3-N-1, Oxygen, Walker rolling DME Agency:  [place]:  RN, PT, Nurse's Aide [place] Agency:  [place]  Status of Service:  Completed, signed off  If discussed at [place] of Stay Meetings, dates discussed:    Discharge Disposition: home with home health   Additional Comments:  02/17/17- 1130- Marvetta Gibbons RN, CM- pt for d/c today- pt has decided that he would prefer not to go back to SNF and he plans to go to his friends home-  Address- [address] [place] [address]- phone: [phone], pt will need home 02, RW, and 3n1- orders have been placed- also [place] services have been ordered for HHRN/PT/aide- spoke with pt at bedside- choice offered for [facility] agency, pt has chosen [facility]- referral called to Dian Situ with [facility], referral has been accepted-  have also notified Brad with [facility] for DME needs- RW, 3n1, and portable 02 tank to be delivered to room prior to discharge.  Call made to [facility] with [facility] have not received a return call.   02/16/17- 1630- Marvetta Gibbons RN, CM- received call from [place] with [facility] regarding d/c plans- contact# [phone] if anything needed for discharge- noted that pt was at [facility] with plan to return to SNF- if plans change she can assist with any  d/c needs.   [name], RN 02/17/2017, 11:29 AM

## 2017-02-17 NOTE — Progress Notes (Signed)
Patient verbalized understanding of discharge instructions and was provided with discharge prescriptions. Patient connected to home o2 tank at prescribed 3L and had a second o2 tank provided by [facility]. Patient also provided with 3-n-1 and front wheel walker. Patient's friend at bedside that patient will be staying with.

## 2017-02-17 NOTE — Progress Notes (Signed)
SATURATION QUALIFICATIONS: (This note is used to comply with regulatory documentation for home oxygen)  Patient Saturations on Room Air at Rest = 89%  Patient Saturations on Room Air while Ambulating = 84%  Patient Saturations on 3 Liters of oxygen while Ambulating = 93%  Please briefly explain why patient needs home oxygen:  Patient's oxygen dropped to 84% while ambulating on room air. Patient asymptomatic. O2 placed back on at 3L and patient recovered to 93% and voiced no complaints.

## 2017-02-17 NOTE — Progress Notes (Signed)
Feels much better Does not want to go back to SNF Once to try to go home with home and services-he will actually go to a friend's house where he claims he will have 24/7 care. He is aware that he will require oncology evaluation-he claims that his PCP/CT surgery is in the process of arranging an appointment for him. Please see DC summary for details

## 2017-02-19 ENCOUNTER — Other Ambulatory Visit: Payer: Self-pay | Admitting: *Deleted

## 2017-02-19 DIAGNOSIS — Z902 Acquired absence of lung [part of]: Secondary | ICD-10-CM

## 2017-02-20 LAB — CULTURE, BLOOD (ROUTINE X 2)
CULTURE: NO GROWTH
Culture: NO GROWTH

## 2017-02-22 ENCOUNTER — Telehealth: Payer: Self-pay | Admitting: Internal Medicine

## 2017-02-22 NOTE — Telephone Encounter (Signed)
Needs to schedule an appointment for a follow up post surgery. [phone]

## 2017-02-22 NOTE — Telephone Encounter (Signed)
Note to Paul Valdez. 

## 2017-02-23 ENCOUNTER — Telehealth: Payer: Self-pay | Admitting: Internal Medicine

## 2017-02-23 NOTE — Telephone Encounter (Signed)
sw pt to confirm 3/13 appt at 1145 am per LOS

## 2017-02-24 ENCOUNTER — Encounter: Payer: Self-pay | Admitting: Surgery

## 2017-02-24 ENCOUNTER — Ambulatory Visit
Admission: RE | Admit: 2017-02-24 | Discharge: 2017-02-24 | Disposition: A | Discharge status: Home / Self Care | Payer: Medicare Other | Source: Ambulatory Visit | Attending: Surgery | Admitting: Surgery

## 2017-02-24 ENCOUNTER — Ambulatory Visit (INDEPENDENT_AMBULATORY_CARE_PROVIDER_SITE_OTHER): Payer: Self-pay | Admitting: Surgery

## 2017-02-24 VITALS — BP 93/63 | HR 86 | Resp 20 | Ht 70.0 in | Wt 188.0 lb

## 2017-02-24 DIAGNOSIS — R918 Other nonspecific abnormal finding of lung field: Secondary | ICD-10-CM

## 2017-02-24 DIAGNOSIS — Z902 Acquired absence of lung [part of]: Secondary | ICD-10-CM

## 2017-02-25 ENCOUNTER — Encounter: Payer: Self-pay | Admitting: Surgery

## 2017-02-25 NOTE — Progress Notes (Signed)
HPI: Patient returns for routine postoperative follow-up having undergone left muscle-sparing thoracotomy and left upper lobectomy for lung cancer on 02/02/2017. 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 The patient's early postoperative recovery while in the hospital was notable for a slow postop course due to COPD with hypoxemia and hypercarbia. He developed postop atrial fibrillation and was controlled with Cardizem and anticoagulated with Eliquis. Amiodarone was not used due to his lung disease. He was discharged to [facility] for rehab. Since hospital discharge the patient was readmitted a few days later with shortness of breath and possible left lung pneumonia to the hospitalist service and I saw him in consultation. A CTA scan ruled out PE and showed no significant abnormality in the chest. There was a small left pleural space that had filled with fluid as expected. He recovered and was sent home by the hospitalist. According to the patient he was not given clear instructions about what medications to take and did not receive prescriptions for Augmentin or Eliquis. He says that he has been doing ok at home but has had a low BP and feeling weak. He says that he saw his PCP, Dr. Moreen Fowler, and was told to stop the lisinopril due to low BP. He is using oxygen at 3L [place] and feels like his breathing is ok. He denies any fever or chills, has no cough or sputum production. His pain is minimal. He is here with his friend [name] today.   Current Outpatient Prescriptions  Medication Sig Dispense Refill  . cyanocobalamin (,VITAMIN B-12,) 1000 MCG/ML injection Inject 1 mL (1,000 mcg total) into the skin every 30 (thirty) days. Please go to your [facility] MD's office for this injection 10 mL 0  . diltiazem (CARTIA XT) 120 MG 24 hr capsule Take 120 mg by mouth daily.    . furosemide (LASIX) 40 MG  tablet Take 1 tablet (40 mg total) by mouth daily. 30 tablet 0  . gabapentin (NEURONTIN) 300 MG capsule Take 2 capsules (600 mg total) by mouth 3 (three) times daily. 180 capsule 0  . hydrocortisone (ANUSOL-HC) 2.5 % rectal cream Place rectally 3 (three) times daily. For 5 days only. 30 g 0  . lisinopril (PRINIVIL,ZESTRIL) 40 MG tablet Take 40 mg by mouth daily.    Marland Kitchen omeprazole (PRILOSEC) 20 MG capsule Take 1 capsule (20 mg total) by mouth every evening. 30 capsule 0  . potassium chloride 20 MEQ TBCR Take 20 mEq by mouth daily. 30 tablet 0  . sertraline (ZOLOFT) 100 MG tablet Take 1 tablet (100 mg total) by mouth daily. 30 tablet 0  . tamsulosin (FLOMAX) 0.4 MG CAPS capsule Take 1 capsule (0.4 mg total) by mouth daily after supper. 30 capsule 0  . Vitamin D, Ergocalciferol, (DRISDOL) 50000 units CAPS capsule Take 1 capsule (50,000 Units total) by mouth every 7 (seven) days. Every Friday 30 capsule 0  . apixaban (ELIQUIS) 5 MG TABS tablet Take 1 tablet (5 mg total) by mouth 2 (two) times daily. (Patient not taking: Reported on 02/24/2017) 60 tablet 0  . atorvastatin (LIPITOR) 40 MG tablet Take 1 tablet (40 mg total) by mouth every evening. (Patient not taking: Reported on 02/24/2017) 30 tablet 0   No current facility-administered medications for this visit.     Physical Exam: BP 93/63   Pulse 86   Resp 20   Ht '5\' 10"'$  (  1.778 m)   Wt 188 lb (85.3 kg)   SpO2 97% Comment: 3L O2per   BMI 26.98 kg/m  He looks comfortable and seems to be breathing well Lungs are clear The left chest incision is healing well Cardiac exam shows a regular rate and rhythm with occasional extra beats with normal heart sounds. There is no peripheral edema.  Diagnostic Tests:  CLINICAL DATA:  Status post left upper lobectomy on February 01, 2017 for squamous cell malignancy. No current chest complaints. History of asthma and atrial fibrillation, coronary artery disease. No current complaints.  EXAM: CHEST  2  VIEW  COMPARISON:  Chest x-ray of February 15, 2017 and CT scan of the chest of the same day.  FINDINGS: The right lung is adequately inflated. Prominent elevation of a portion of the right hemidiaphragm is stable. On the left there are post upper lobectomy changes. There is no alveolar infiltrate. There is no significant pleural effusion and no pneumothorax. The heart is normal in size. There is calcification in the wall of the aortic arch. The pulmonary vascularity is nearly normal. There is a moderate-sized hiatal hernia.  IMPRESSION: Further interval improvement in the appearance of the chest with improved aeration and decreased pulmonary interstitial edema. Persistent large hiatal hernia.  Thoracic aortic atherosclerosis.   Electronically Signed   By: David  Martinique M.D.   On: 02/24/2017 15:47    Impression:  He is making a satisfactory but slow recovery following left upper lobectomy for lung cancer. He has significant COPD with resting hypoxemia and room air sat of only 91% preop. He is obese and deconditioned and at 71 years old was a marginal surgical candidate. I think he will continue to improve but it is going to take some time. Hopefully he will be able to get off oxygen at some point but we will have to accept sats around 88-90% since he was only 91% saturated preop on room air. I encouraged him to continue ambulation and IS. He has a follow up appt to see Dr. Julien Nordmann to discuss further adjuvent treatment to decrease his recurrence risk given the size of the tumor and visceral pleural involvement. He had recurrent atrial fibrillation postop with a controlled rate on Cardizem which he is still taking. He was initially on Eliquis but recently not sent home with a prescription so he is going to return to see Dr. Irish Lack to discuss whether to continue this.   Plan:  I will plan to see him again in 3 weeks with a CXR. He will follow up with Dr. Irish Lack concerning  his atrial fibrillation and Dr. Moreen Fowler for his other medical care. He has an appt in march with Dr. Julien Nordmann.   Gaye Pollack, MD [facility] [phone]

## 2017-03-01 ENCOUNTER — Ambulatory Visit (INDEPENDENT_AMBULATORY_CARE_PROVIDER_SITE_OTHER): Payer: Medicare Other | Admitting: Physician Assistant

## 2017-03-01 ENCOUNTER — Encounter: Payer: Self-pay | Admitting: Physician Assistant

## 2017-03-01 VITALS — BP 126/80 | HR 88 | Ht 68.0 in | Wt 196.8 lb

## 2017-03-01 DIAGNOSIS — I10 Essential (primary) hypertension: Secondary | ICD-10-CM | POA: Diagnosis not present

## 2017-03-01 DIAGNOSIS — I5032 Chronic diastolic (congestive) heart failure: Secondary | ICD-10-CM

## 2017-03-01 DIAGNOSIS — C3492 Malignant neoplasm of unspecified part of left bronchus or lung: Secondary | ICD-10-CM

## 2017-03-01 DIAGNOSIS — I480 Paroxysmal atrial fibrillation: Secondary | ICD-10-CM | POA: Diagnosis not present

## 2017-03-01 MED ORDER — DILTIAZEM HCL ER COATED BEADS 180 MG PO CP24: 180.0000 mg | ORAL_CAPSULE | Freq: Every day | ORAL | 0 refills | Status: AC

## 2017-03-01 MED ORDER — ATORVASTATIN CALCIUM 40 MG PO TABS: 40.0000 mg | ORAL_TABLET | Freq: Every evening | ORAL | 1 refills | Status: AC

## 2017-03-01 MED ORDER — APIXABAN 5 MG PO TABS: 5.0000 mg | ORAL_TABLET | Freq: Two times a day (BID) | ORAL | 1 refills | Status: AC

## 2017-03-01 MED ORDER — FUROSEMIDE 40 MG PO TABS: 40.0000 mg | ORAL_TABLET | Freq: Every day | ORAL | 1 refills | Status: DC

## 2017-03-01 MED ORDER — POTASSIUM CHLORIDE ER 20 MEQ PO TBCR: 20.0000 meq | EXTENDED_RELEASE_TABLET | Freq: Every day | ORAL | 1 refills | Status: DC

## 2017-03-01 NOTE — Progress Notes (Signed)
Cardiology Office Note    Date:  03/01/2017   ID:  Connery, Shiffler Oct 31, 1946, MRN [id number]  PCP:  Gara Kroner, MD  Cardiologist: Dr. Irish Lack  Chief Complaint  Patient presents with  . hospital f/u    History of Present Illness:  Paul Valdez is a 71 y.o. male with history of hypertension, HLD, prior smoker with COPD and asthma who was seen on 01/22/17 for preoperative evaluation for presumed lung cancer. Patient had normal stress test in 2014 an essential normal echo in 2015. He's had no recent cardiac symptoms and no further cardiac testing was recommended prior to surgery. He did have mild to moderate carotid disease in the past and carotid Dopplers were recommended. Blood pressure was well-controlled and he was RA on atorvastatin for hyperlipidemia.  Patient underwent left thoracotomy and left upper lobectomy 02/01/17 for 4 cm squamous cell carcinoma with negative nodes but extension through the visceral pleura. He has slow postop course due to obesity, hypoxemia. He had postop atrial fibrillation with controlled rate on Cardizem(metoprolol stopped) was discharged on Eliquis. Chadsvasc=2. Patient she was discharged 02/12/17 and readmitted 02/15/17 with pneumonia and acute diastolic CHF. He also had some CHF with elevated BNP and lower extremity edema. 2-D echo 02/16/17 showed LVH with normal LVEF 60-65% with indeterminant diastolic function, moderately dilated left atrium.  Patient comes in today very confused over his medications. He went home to live with a friend and went to a different [employer] and he usually goes to to pick up his medications. For some reason his potassium was not filled and Eliquis was not given to him. He has not been on either of these. He had blood work done last week by primary care but we don't have these results. In the hospital his lisinopril was stopped because of low blood pressure but when he went home he took this in addition to the Cardizem and his  blood pressure dropped. He is now back off his lisinopril. He is monitoring his oxygen and heart rate. His heart rate got up over 100 when he was walking without oxygen recently. His heart rate is elevated today walking in here.   Past Medical History:  Diagnosis Date  . Arthritis   . Asthma   . Atrial fibrillation ([place])   . Atypical chest pain    a. Normal nuc 2014.  . Carotid artery disease ([place])    a. Carotid duplex 2015: 41-32% RICA, 4-40% LICA.   Marland Kitchen Cataract   . Coronary artery calcification seen on CT scan   . Depression   . Detached retina   . Former consumption of alcohol   . Former tobacco use   . GERD (gastroesophageal reflux disease)    "I take heart burn medicine"  . Glaucoma   . Hyperlipemia   . Hypertension   . Low back pain 12/29/2016  . Mass of upper lobe of left lung 12/29/2016  . PVD (peripheral vascular disease) ([place])    a. Mild plaque of iliacs in 2013 on duplex; PET 2018:  PET also corroborated coronary, aortic arch, and branch vessel atherosclerotic vascular disease as well as aortoiliac atherosclerotic vascular disease.  . Sleep apnea    has not gotten CPAP yet    Past Surgical History:  Procedure Laterality Date  . APPENDECTOMY    . COLONOSCOPY    . EYE SURGERY    . FLEXIBLE BRONCHOSCOPY N/A 02/01/2017   Procedure: FLEXIBLE BRONCHOSCOPY;  Surgeon: Gaye Pollack, MD;  Location: [facility] OR;  Service: Thoracic;  Laterality: N/A;  . GANGLION CYST EXCISION     left hand  . HERNIA REPAIR     double hernia repair  . THORACOTOMY/LOBECTOMY Left 02/01/2017   Procedure: THORACOTOMY/LEFT UPPER LOBECTOMY;  Surgeon: Gaye Pollack, MD;  Location: [facility] OR;  Service: Thoracic;  Laterality: Left;  . TUMOR REMOVAL     non cancer tumor from neck    Current Medications: Outpatient Medications Prior to Visit  Medication Sig Dispense Refill  . cyanocobalamin (,VITAMIN B-12,) 1000 MCG/ML injection Inject 1 mL (1,000 mcg total) into the skin every 30 (thirty) days. Please go to  your [facility]im[facility] MD's office for this injection 10 mL 0  . gabapentin (NEURONTIN) 300 MG capsule Take 2 capsules (600 mg total) by mouth 3 (three) times daily. 180 capsule 0  . hydrocortisone (ANUSOL-HC) 2.5 % rectal cream Place rectally 3 (three) times daily. For 5 days only. 30 g 0  . omeprazole (PRILOSEC) 20 MG capsule Take 1 capsule (20 mg total) by mouth every evening. 30 capsule 0  . sertraline (ZOLOFT) 100 MG tablet Take 1 tablet (100 mg total) by mouth daily. 30 tablet 0  . tamsulosin (FLOMAX) 0.4 MG CAPS capsule Take 1 capsule (0.4 mg total) by mouth daily after supper. 30 capsule 0  . Vitamin D, Ergocalciferol, (DRISDOL) 50000 units CAPS capsule Take 1 capsule (50,000 Units total) by mouth every 7 (seven) days. Every Friday 30 capsule 0  . atorvastatin (LIPITOR) 40 MG tablet Take 1 tablet (40 mg total) by mouth every evening. 30 tablet 0  . diltiazem (CARTIA XT) 120 MG 24 hr capsule Take 120 mg by mouth daily.    . furosemide (LASIX) 40 MG tablet Take 1 tablet (40 mg total) by mouth daily. 30 tablet 0  . lisinopril (PRINIVIL,ZESTRIL) 40 MG tablet Take 40 mg by mouth daily.    Marland Kitchen apixaban (ELIQUIS) 5 MG TABS tablet Take 1 tablet (5 mg total) by mouth 2 (two) times daily. (Patient not taking: Reported on 03/01/2017) 60 tablet 0  . potassium chloride 20 MEQ TBCR Take 20 mEq by mouth daily. (Patient not taking: Reported on 03/01/2017) 30 tablet 0   No facility-administered medications prior to visit.      Allergies:   Colchicine   Social History   Social History  . Marital status: Single    Spouse name: N/A  . Number of children: N/A  . Years of education: N/A   Social History Main Topics  . Smoking status: Former Research scientist (life sciences)  . Smokeless tobacco: Never Used  . Alcohol use No  . Drug use: No  . Sexual activity: Not Asked   Other Topics Concern  . None   Social History Narrative  . None     Family History:  The patient's family history includes Colon cancer in his brother, father, and  sister; Hypertension in his father and mother.   ROS:   Please see the history of present illness.    Review of Systems  Constitution: Positive for weakness and malaise/fatigue.  HENT: Negative.   Cardiovascular: Positive for dyspnea on exertion and irregular heartbeat.  Respiratory: Negative.   Endocrine: Negative.   Hematologic/Lymphatic: Negative.   Musculoskeletal: Positive for back pain and muscle weakness.  Gastrointestinal: Negative.   Genitourinary: Negative.   Psychiatric/Behavioral: The patient is nervous/anxious.    All other systems reviewed and are negative.   PHYSICAL EXAM:   VS:  BP 126/80   Pulse 88  Ht _0  (1.727 m)   Wt 196 lb 12.8 oz (89.3 kg)   SpO2 90%   BMI 29.92 kg/m   Physical Exam  GEN: Well nourished, well developed, in no acute distress, On oxygen  Neck: no JVD, carotid bruits, or masses Cardiac: Irregular irregular; no murmurs, rubs, or gallops  Respiratory: Decreased breath sounds without rales GI: soft, nontender, nondistended, + BS Ext: without cyanosis, clubbing, or edema, Good distal pulses bilaterally Psych: Anxious, full affect  Wt Readings from Last 3 Encounters:  03/01/17 196 lb 12.8 oz (89.3 kg)  02/24/17 188 lb (85.3 kg)  02/17/17 189 lb 1.6 oz (85.8 kg)      Studies/Labs Reviewed:   EKG:  EKG is ordered today.  The ekg ordered today demonstrates Atrial fibrillation at 102 bpm  Recent Labs: 02/09/2017: TSH 0.959 02/15/2017: B Natriuretic Peptide 984.2 02/17/2017: ALT 90; BUN 10; Creatinine, Ser 0.97; Hemoglobin 9.8; Magnesium 1.7; Platelets 365; Potassium 3.6; Sodium 137   Lipid Panel No results found for: CHOL, TRIG, HDL, CHOLHDL, VLDL, LDLCALC, LDLDIRECT  Additional studies/ records that were reviewed today include:  2-D echo 02/16/17 Study Conclusions   - Left ventricle: The cavity size was normal. Wall thickness was   increased in a pattern of mild LVH. Systolic function was normal.   The estimated ejection fraction  was in the range of 60% to 65%.   Indeterminant diastolic function. Wall motion was normal; there   were no regional wall motion abnormalities. - Aortic valve: There was no stenosis. - Mitral valve: There was trivial regurgitation. - Left atrium: The atrium was moderately dilated. - Right ventricle: The cavity size was normal. Systolic function   was normal. - Right atrium: The atrium was mildly dilated. - Tricuspid valve: Peak RV-RA gradient (S): 30 mm Hg. - Pulmonary arteries: PA peak pressure: 33 mm Hg (S). - Inferior vena cava: The vessel was normal in size. The   respirophasic diameter changes were in the normal range (>= 50%),   consistent with normal central venous pressure. - Pericardium, extracardiac: A trivial pericardial effusion was   identified posterior to the heart.   Impressions:   - Normal LV size with mild LV hypertrophy. EF 60-65%. Normal RV   size and systolic function. Biatrial enlargement. No significant   valvular abnormalities.    ASSESSMENT:    1. Paroxysmal atrial fibrillation (HCC)   2. Essential hypertension   3. Chronic diastolic CHF (congestive heart failure) ([place])   4. Squamous cell carcinoma lung, left (HCC)      PLAN:  In order of problems listed above:  PAF rate is not controlled. Will increase diltiazem to 180 mg once daily. Patient never started Eliquis. He will get it filled today. Follow-up with Dr.Varanasi in one month.  Essential hypertension blood pressure controlled today. We'll continue to hold lisinopril and increase diltiazem  Chronic diastolic CHF compensated today. Continue Lasix. Patient has not been taking potassium. He will get it filled today. He had lab work done last week at [facility] that we will try to obtain results of.  Squamous cell carcinoma status post surgery meeting the oncologist next week.    Medication Adjustments/Labs and Tests Ordered: Current medicines are reviewed at length with the patient today.   Concerns regarding medicines are outlined above.  Medication changes, Labs and Tests ordered today are listed in the Patient Instructions below. Patient Instructions  Medication Instructions:  Your physician has recommended you make the following change in your medication:  1.  INCREASE the Cardizem to 180 mg taking 1 tablet daily  Go to [facility] and pick up the following  Medications: Eliquis Potassium Cardizem  We have also sent in refills of all your medications to [facility] for all of your medications.   Labwork: None ordered  Testing/Procedures: None ordered  Follow-Up: Your physician recommends that you schedule a follow-up appointment in: [place] DR. VARANASI   Any Other Special Instructions Will Be Listed Below (If Applicable).   If you need a refill on your cardiac medications before your next appointment, please call your pharmacy.      Sumner Boast, PA-C  03/01/2017 9:58 AM    [facility] [address], [place], [place]  [address] Phone: [phone]; Fax: [phone]

## 2017-03-01 NOTE — Patient Instructions (Addendum)
Medication Instructions:  Your physician has recommended you make the following change in your medication:  1.  INCREASE the Cardizem to 180 mg taking 1 tablet daily  Go to [facility] and pick up the following  Medications: Eliquis Potassium Cardizem  We have also sent in refills of all your medications to [facility] for all of your medications.   Labwork: None ordered  Testing/Procedures: None ordered  Follow-Up: Your physician recommends that you schedule a follow-up appointment in: [place] DR. VARANASI   Any Other Special Instructions Will Be Listed Below (If Applicable).   If you need a refill on your cardiac medications before your next appointment, please call your pharmacy.

## 2017-03-03 ENCOUNTER — Encounter: Payer: Self-pay | Admitting: Interventional Cardiology

## 2017-03-08 ENCOUNTER — Other Ambulatory Visit: Payer: Self-pay | Admitting: *Deleted

## 2017-03-08 DIAGNOSIS — C3492 Malignant neoplasm of unspecified part of left bronchus or lung: Secondary | ICD-10-CM

## 2017-03-09 ENCOUNTER — Telehealth: Payer: Self-pay | Admitting: Internal Medicine

## 2017-03-09 ENCOUNTER — Other Ambulatory Visit (HOSPITAL_BASED_OUTPATIENT_CLINIC_OR_DEPARTMENT_OTHER): Payer: Medicare Other

## 2017-03-09 ENCOUNTER — Ambulatory Visit (HOSPITAL_BASED_OUTPATIENT_CLINIC_OR_DEPARTMENT_OTHER): Payer: Medicare Other | Admitting: Internal Medicine

## 2017-03-09 ENCOUNTER — Encounter: Payer: Self-pay | Admitting: Internal Medicine

## 2017-03-09 VITALS — BP 104/81 | HR 75 | Temp 98.4°F | Resp 19 | Ht 68.0 in | Wt 194.8 lb

## 2017-03-09 DIAGNOSIS — C3412 Malignant neoplasm of upper lobe, left bronchus or lung: Secondary | ICD-10-CM | POA: Diagnosis not present

## 2017-03-09 DIAGNOSIS — C3492 Malignant neoplasm of unspecified part of left bronchus or lung: Secondary | ICD-10-CM

## 2017-03-09 DIAGNOSIS — K649 Unspecified hemorrhoids: Secondary | ICD-10-CM | POA: Diagnosis not present

## 2017-03-09 HISTORY — DX: Unspecified hemorrhoids: K64.9

## 2017-03-09 LAB — COMPREHENSIVE METABOLIC PANEL
ALBUMIN: 2.9 g/dL — AB (ref 3.5–5.0)
ALK PHOS: 90 U/L (ref 40–150)
ALT: 13 U/L (ref 0–55)
ANION GAP: 11 meq/L (ref 3–11)
AST: 15 U/L (ref 5–34)
BILIRUBIN TOTAL: 0.86 mg/dL (ref 0.20–1.20)
BUN: 12.5 mg/dL (ref 7.0–26.0)
CALCIUM: 9.6 mg/dL (ref 8.4–10.4)
CO2: 29 mEq/L (ref 22–29)
Chloride: 98 mEq/L (ref 98–109)
Creatinine: 1 mg/dL (ref 0.7–1.3)
EGFR: 74 mL/min/{1.73_m2} — AB (ref >90)
Glucose: 111 mg/dl (ref 70–140)
POTASSIUM: 3.9 meq/L (ref 3.5–5.1)
Sodium: 138 mEq/L (ref 136–145)
TOTAL PROTEIN: 7.3 g/dL (ref 6.4–8.3)

## 2017-03-09 LAB — CBC WITH DIFFERENTIAL/PLATELET
BASO%: 0.5 % (ref 0.0–2.0)
BASOS ABS: 0.1 10*3/uL (ref 0.0–0.1)
EOS ABS: 0.4 10*3/uL (ref 0.0–0.5)
EOS%: 4.4 % (ref 0.0–7.0)
HEMATOCRIT: 34.1 % — AB (ref 38.4–49.9)
HEMOGLOBIN: 11.1 g/dL — AB (ref 13.0–17.1)
LYMPH#: 1.3 10*3/uL (ref 0.9–3.3)
LYMPH%: 13.5 % — ABNORMAL LOW (ref 14.0–49.0)
MCH: 29.1 pg (ref 27.2–33.4)
MCHC: 32.6 g/dL (ref 32.0–36.0)
MCV: 89.5 fL (ref 79.3–98.0)
MONO#: 1.1 10*3/uL — AB (ref 0.1–0.9)
MONO%: 11.5 % (ref 0.0–14.0)
NEUT#: 6.6 10*3/uL — ABNORMAL HIGH (ref 1.5–6.5)
NEUT%: 70.1 % (ref 39.0–75.0)
NRBC: 0 % (ref 0–0)
PLATELETS: 228 10*3/uL (ref 140–400)
RBC: 3.81 10*6/uL — ABNORMAL LOW (ref 4.20–5.82)
RDW: 15.2 % — ABNORMAL HIGH (ref 11.0–14.6)
WBC: 9.4 10*3/uL (ref 4.0–10.3)

## 2017-03-09 NOTE — Progress Notes (Signed)
[place] [phone]   [phone]  OFFICE PROGRESS NOTE  [place]roner, [place] [address]  DIAGNOSIS: Stage IB (T2a, N0, M0) non-small cell lung cancer, invasive squamous cell carcinoma diagnosed in January 2018.  PRIOR THERAPY: Status post left upper lobectomy with lymph node dissection under the care of Dr. Cyndia Bent on 02/01/2017.  CURRENT THERAPY: None  INTERVAL HISTORY: Paul Valdez 71 y.o. male returns to the clinic today for follow-up visit accompanied by his friend. The patient recently underwent left upper lobectomy with lymph node dissection under the care of Dr. Cyndia Bent. He is recovering from his surgery well except for mild soreness on the left side of the chest. He also has shortness of breath at baseline and currently on home oxygen. He denied having any cough or hemoptysis. He has no fever or chills. He denied having any significant weight loss or night sweats. He has no nausea, vomiting, diarrhea or constipation but has hemorrhoids. He is here today for evaluation and discussion of his adjuvant treatment options.  MEDICAL HISTORY: Past Medical History:  Diagnosis Date  . Arthritis   . Asthma   . Atrial fibrillation ([place])   . Atypical chest pain    a. Normal nuc 2014.  . Carotid artery disease ([place])    a. Carotid duplex 2015: 74-12% RICA, 8-78% LICA.   Marland Kitchen Cataract   . Coronary artery calcification seen on CT scan   . Depression   . Detached retina   . Former consumption of alcohol   . Former tobacco use   . GERD (gastroesophageal reflux disease)    "I take heart burn medicine"  . Glaucoma   . Hyperlipemia   . Hypertension   . Low back pain 12/29/2016  . Mass of upper lobe of left lung 12/29/2016  . PVD (peripheral vascular disease) ([place])    a. Mild plaque of iliacs in 2013 on duplex; PET 2018:  PET also corroborated coronary, aortic arch, and branch vessel atherosclerotic vascular  disease as well as aortoiliac atherosclerotic vascular disease.  . Sleep apnea    has not gotten CPAP yet    ALLERGIES:  is allergic to colchicine.  MEDICATIONS:  Current Outpatient Prescriptions  Medication Sig Dispense Refill  . apixaban (ELIQUIS) 5 MG TABS tablet Take 1 tablet (5 mg total) by mouth 2 (two) times daily. 60 tablet 1  . atorvastatin (LIPITOR) 40 MG tablet Take 1 tablet (40 mg total) by mouth every evening. 90 tablet 1  . cyanocobalamin (,VITAMIN B-12,) 1000 MCG/ML injection Inject 1 mL (1,000 mcg total) into the skin every 30 (thirty) days. Please go to your [facility]'s office for this injection 10 mL 0  . diltiazem (CARDIZEM CD) 180 MG 24 hr capsule Take 1 capsule (180 mg total) by mouth daily. 90 capsule 0  . furosemide (LASIX) 40 MG tablet Take 1 tablet (40 mg total) by mouth daily. 90 tablet 1  . gabapentin (NEURONTIN) 300 MG capsule Take 2 capsules (600 mg total) by mouth 3 (three) times daily. 180 capsule 0  . hydrocortisone (ANUSOL-HC) 2.5 % rectal cream Place rectally 3 (three) times daily. For 5 days only. 30 g 0  . omeprazole (PRILOSEC) 20 MG capsule Take 1 capsule (20 mg total) by mouth every evening. 30 capsule 0  . Potassium Chloride ER 20 MEQ TBCR Take 20 mEq by mouth daily. 30 tablet 1  . sertraline (ZOLOFT) 100 MG tablet  Take 1 tablet (100 mg total) by mouth daily. 30 tablet 0  . tamsulosin (FLOMAX) 0.4 MG CAPS capsule Take 1 capsule (0.4 mg total) by mouth daily after supper. 30 capsule 0  . Vitamin D, Ergocalciferol, (DRISDOL) 50000 units CAPS capsule Take 1 capsule (50,000 Units total) by mouth every 7 (seven) days. Every Friday 30 capsule 0   No current facility-administered medications for this visit.     SURGICAL HISTORY:  Past Surgical History:  Procedure Laterality Date  . APPENDECTOMY    . COLONOSCOPY    . EYE SURGERY    . FLEXIBLE BRONCHOSCOPY N/A 02/01/2017   Procedure: FLEXIBLE BRONCHOSCOPY;  Surgeon: Gaye Pollack, MD;  Location: [facility] OR;   Service: Thoracic;  Laterality: N/A;  . GANGLION CYST EXCISION     left hand  . HERNIA REPAIR     double hernia repair  . THORACOTOMY/LOBECTOMY Left 02/01/2017   Procedure: THORACOTOMY/LEFT UPPER LOBECTOMY;  Surgeon: Gaye Pollack, MD;  Location: [facility] OR;  Service: Thoracic;  Laterality: Left;  . TUMOR REMOVAL     non cancer tumor from neck    REVIEW OF SYSTEMS:  Constitutional: positive for fatigue Eyes: negative Ears, nose, mouth, throat, and face: negative Respiratory: positive for dyspnea on exertion Cardiovascular: negative Gastrointestinal: positive for Hemorrhoids Genitourinary:negative Integument/breast: negative Hematologic/lymphatic: negative Musculoskeletal:negative Neurological: negative Behavioral/Psych: negative Endocrine: negative Allergic/Immunologic: negative   PHYSICAL EXAMINATION: General appearance: alert, cooperative, fatigued and no distress Head: Normocephalic, without obvious abnormality, atraumatic Neck: no adenopathy, no JVD, supple, symmetrical, trachea midline and thyroid not enlarged, symmetric, no tenderness/mass/nodules Lymph nodes: Cervical, supraclavicular, and axillary nodes normal. Resp: clear to auscultation bilaterally Back: symmetric, no curvature. ROM normal. No CVA tenderness. Cardio: regular rate and rhythm, S1, S2 normal, no murmur, click, rub or gallop GI: soft, non-tender; bowel sounds normal; no masses,  no organomegaly Extremities: extremities normal, atraumatic, no cyanosis or edema Neurologic: Alert and oriented X 3, normal strength and tone. Normal symmetric reflexes. Normal coordination and gait  ECOG PERFORMANCE STATUS: 1 - Symptomatic but completely ambulatory  Blood pressure 104/81, pulse 75, temperature 98.4 F (36.9 C), temperature source Oral, resp. rate 19, height _0  (1.727 m), weight 194 lb 12.8 oz (88.4 kg), SpO2 93 %.  LABORATORY DATA: Lab Results  Component Value Date   WBC 9.4 03/09/2017   HGB 11.1 (L)  03/09/2017   HCT 34.1 (L) 03/09/2017   MCV 89.5 03/09/2017   PLT 228 03/09/2017      Chemistry      Component Value Date/Time   NA 137 02/17/2017 0417   K 3.6 02/17/2017 0417   CL 95 (L) 02/17/2017 0417   CO2 33 (H) 02/17/2017 0417   BUN 10 02/17/2017 0417   CREATININE 0.97 02/17/2017 0417      Component Value Date/Time   CALCIUM 8.5 (L) 02/17/2017 0417   ALKPHOS 78 02/17/2017 0417   AST 80 (H) 02/17/2017 0417   ALT 90 (H) 02/17/2017 0417   BILITOT 0.4 02/17/2017 0417       RADIOGRAPHIC STUDIES: Dg Chest 2 View  Result Date: 02/24/2017 CLINICAL DATA:  Status post left upper lobectomy on February 01, 2017 for squamous cell malignancy. No current chest complaints. History of asthma and atrial fibrillation, coronary artery disease. No current complaints. EXAM: CHEST  2 VIEW COMPARISON:  Chest x-ray of February 15, 2017 and CT scan of the chest of the same day. FINDINGS: The right lung is adequately inflated. Prominent elevation of a portion of the right  hemidiaphragm is stable. On the left there are post upper lobectomy changes. There is no alveolar infiltrate. There is no significant pleural effusion and no pneumothorax. The heart is normal in size. There is calcification in the wall of the aortic arch. The pulmonary vascularity is nearly normal. There is a moderate-sized hiatal hernia. IMPRESSION: Further interval improvement in the appearance of the chest with improved aeration and decreased pulmonary interstitial edema. Persistent large hiatal hernia. Thoracic aortic atherosclerosis. Electronically Signed   By: David  Martinique M.D.   On: 02/24/2017 15:47   Dg Chest 2 View  Result Date: 02/09/2017 CLINICAL DATA:  Lung surgery. EXAM: CHEST  2 VIEW COMPARISON:  02/08/2017 FINDINGS: Mediastinum stable. Heart size stable Postsurgical changes left lung. Interim partial clearing of left lower lobe atelectasis and infiltrates. Persistent low lung volumes. Sliding hiatal hernia. Stable  elevation right hemidiaphragm . Diffuse osteopenia degenerative change thoracic spine. IMPRESSION: Postsurgical changes left lung. Partial clearing of left lower lobe atelectasis and infiltrate. Persistent low lung volumes. Electronically Signed   By: [name]  Register   On: 02/09/2017 08:22   Ct Angio Chest Pe W Or Wo Contrast  Result Date: 02/15/2017 CLINICAL DATA:  Lung cancer, CHF, possible health care associated pneumonia, LEFT lower extremity swelling acute onset of shortness of breath/dyspnea question pulmonary embolism ; history hypertension, coronary artery disease post PTCA, asthma, GERD, former smoker, atrial fibrillation EXAM: CT ANGIOGRAPHY CHEST WITH CONTRAST TECHNIQUE: Multidetector CT imaging of the chest was performed using the standard protocol during bolus administration of intravenous contrast. Multiplanar CT image reconstructions and MIPs were obtained to evaluate the vascular anatomy. CONTRAST:  100 cc Isovue 370 IV COMPARISON:  PET-CT 01/04/2017 FINDINGS: Cardiovascular: Atherosclerotic calcifications aorta and minimally in coronary arteries. Aneurysmal dilatation ascending thoracic aorta 4.3 cm transverse image 45. Scattered beam hardening artifacts from dense contrast in the SVC. Pulmonary arteries well opacified and patent. No evidence of pulmonary embolism. No pericardial effusion. Enlargement of cardiac chambers. Mediastinum/Nodes: Large hiatal hernia. Base of cervical region normal appearance. No thoracic adenopathy. Lungs/Pleura: Post LEFT upper lobectomy LEFT pleural thickening/fluid in the LEFT hemithorax. Partial atelectasis of LEFT lower lobe. Peripheral atelectasis and a few subpleural blebs in RIGHT lung. No definite infiltrate or pneumothorax. Significant elevation of RIGHT diaphragm. Upper Abdomen: Unremarkable Musculoskeletal: Diffuse osseous demineralization. Review of the MIP images confirms the above findings. IMPRESSION: No evidence of pulmonary embolism. Scattered  atelectasis RIGHT lung with postsurgical changes in LEFT lung post LEFT upper lobectomy. Hiatal hernia. Aortic atherosclerosis and coronary arterial calcification with aneurysmal dilatation of the ascending thoracic aorta, 4.3 cm transverse, recommendation below. Recommend annual imaging followup by CTA or MRA. This recommendation follows 2010 ACCF/AHA/AATS/ACR/ASA/SCA/SCAI/SIR/STS/SVM Guidelines for the Diagnosis and Management of Patients with Thoracic Aortic Disease. Circulation. 2010; 121: A355-D322 Electronically Signed   By: Lavonia Dana M.D.   On: 02/15/2017 11:37   Dg Chest Port 1 View  Result Date: 02/15/2017 CLINICAL DATA:  71 year old male with fever and shortness of breath. EXAM: PORTABLE CHEST 1 VIEW COMPARISON:  Chest radiograph dated 02/09/2017 FINDINGS: There is low lung volumes. There progression of airspace density involving the left mid to lower lung fields concerning for developing infiltrate. Right mid and lower lung field interstitial prominence and streaky densities noted. There is no significant pleural effusion. No pneumothorax. There is silhouetting of the cardiac borders. Multiple surgical clips noted in the left hilar and suprahilar region. There is minimally displaced fracture of the lateral left fifth rib which appears new compared to prior study. Correlation  with point tenderness recommended. IMPRESSION: 1. Increased left mid for lower lung field airspace opacity concerning for developing pneumonia versus pulmonary contusion. Right mid to lower lung field interstitial prominence and streaky densities may represent atelectasis versus infiltrate. 2. Minimally displaced fracture of the lateral aspect of the left fifth rib, new from prior study. No pneumothorax. Electronically Signed   By: Anner Crete M.D.   On: 02/15/2017 04:10   Dg Chest Port 1 View  Result Date: 02/08/2017 CLINICAL DATA:  Shortness of breath. History of left upper lobectomy on February 01, 2017 EXAM: PORTABLE  CHEST 1 VIEW COMPARISON:  PET-CT study of January 14, 2017 and chest x-ray of February 07, 2017 FINDINGS: The lung volumes remain low with elevation of the right hemidiaphragm. There is persistent increased interstitial density in the mid and lower left lung and at the right lung base. There are probable posterior layering pleural effusions bilaterally. The cardiac silhouette remains enlarged. The central pulmonary vascularity is prominent. There surgical clips in the left axillary region. The observed bony thorax exhibits no acute abnormality. IMPRESSION: Persistent elevation of the right hemidiaphragm. Persistent bilateral interstitial and mild alveolar opacities compatible with pneumonia. Stable cardiomegaly without definite pulmonary edema. Electronically Signed   By: David  Martinique M.D.   On: 02/08/2017 07:19    ASSESSMENT AND PLAN:  This is a very pleasant 71 years old white male recently diagnosed with a stage IB (T2a, N0, M0) non-small cell lung cancer, squamous cell carcinoma status post left upper lobectomy with lymph node dissection with final tumor size of 4.0 cm and involvement of the visceral pleura. There was no evidence for metastatic disease to the lymph nodes. I had a lengthy discussion with the patient and his friend about his current disease is stage, prognosis and treatment options as well as the role of adjuvant systemic chemotherapy. I explained to the patient that the 5 years median overall survival for stage IB is around 60% and adjuvant systemic chemotherapy would have absolute benefit of survival in the range of 5-10 %. I discussed with the patient adverse effect of the chemotherapy including but not limited to alopecia, myelosuppression, nausea and vomiting, peripheral neuropathy, liver or renal dysfunction. I gave the patient the option of continuous observation and close monitoring versus proceeding with 4 cycles of adjuvant chemotherapy likely with carboplatin and paclitaxel. I  don't think the patient would be good candidate for treatment with cisplatin because of the significant toxicity. After a detailed discussion of these treatment options, the patient decided not to proceed with the adjuvant systemic chemotherapy and he preferred to continue on observation for now. I would see him back for follow-up visit in 6 months for evaluation with repeat CT scan of the chest for restaging of his disease. For the hemorrhoids, I strongly recommended for the patient to see his gastroenterologist Dr. Amedeo Plenty for evaluation and management of this condition. He was advised to call immediately if he has any concerning symptoms in the interval. The patient voices understanding of current disease status and treatment options and is in agreement with the current care plan.  All questions were answered. The patient knows to call the clinic with any problems, questions or concerns. We can certainly see the patient much sooner if necessary.  Disclaimer: This note was dictated with voice recognition software. Similar sounding words can inadvertently be transcribed and may not be corrected upon review.

## 2017-03-09 NOTE — Telephone Encounter (Signed)
Appointments scheduled per 03/09/17 los. Patient was given a copy of the AVS report and appointment schedule per 03/09/17 los.

## 2017-03-12 ENCOUNTER — Encounter (HOSPITAL_COMMUNITY): Payer: Self-pay | Admitting: Neurology

## 2017-03-12 ENCOUNTER — Inpatient Hospital Stay (HOSPITAL_COMMUNITY)
Admission: EM | Admit: 2017-03-12 | Discharge: 2017-03-18 | DRG: 291 | Disposition: A | Discharge status: Home Health | Payer: Medicare Other | Attending: Internal Medicine | Admitting: Internal Medicine

## 2017-03-12 ENCOUNTER — Emergency Department (HOSPITAL_COMMUNITY): Payer: Medicare Other

## 2017-03-12 ENCOUNTER — Telehealth: Payer: Self-pay | Admitting: Interventional Cardiology

## 2017-03-12 DIAGNOSIS — Z902 Acquired absence of lung [part of]: Secondary | ICD-10-CM

## 2017-03-12 DIAGNOSIS — Z79899 Other long term (current) drug therapy: Secondary | ICD-10-CM

## 2017-03-12 DIAGNOSIS — R0600 Dyspnea, unspecified: Secondary | ICD-10-CM

## 2017-03-12 DIAGNOSIS — T502X5A Adverse effect of carbonic-anhydrase inhibitors, benzothiadiazides and other diuretics, initial encounter: Secondary | ICD-10-CM | POA: Diagnosis not present

## 2017-03-12 DIAGNOSIS — R7989 Other specified abnormal findings of blood chemistry: Secondary | ICD-10-CM | POA: Diagnosis present

## 2017-03-12 DIAGNOSIS — H409 Unspecified glaucoma: Secondary | ICD-10-CM | POA: Diagnosis present

## 2017-03-12 DIAGNOSIS — Z7901 Long term (current) use of anticoagulants: Secondary | ICD-10-CM

## 2017-03-12 DIAGNOSIS — R0603 Acute respiratory distress: Secondary | ICD-10-CM | POA: Diagnosis present

## 2017-03-12 DIAGNOSIS — J9600 Acute respiratory failure, unspecified whether with hypoxia or hypercapnia: Secondary | ICD-10-CM | POA: Diagnosis present

## 2017-03-12 DIAGNOSIS — J9621 Acute and chronic respiratory failure with hypoxia: Secondary | ICD-10-CM | POA: Diagnosis present

## 2017-03-12 DIAGNOSIS — E876 Hypokalemia: Secondary | ICD-10-CM | POA: Diagnosis present

## 2017-03-12 DIAGNOSIS — K219 Gastro-esophageal reflux disease without esophagitis: Secondary | ICD-10-CM | POA: Diagnosis present

## 2017-03-12 DIAGNOSIS — C3490 Malignant neoplasm of unspecified part of unspecified bronchus or lung: Secondary | ICD-10-CM | POA: Diagnosis present

## 2017-03-12 DIAGNOSIS — R0602 Shortness of breath: Secondary | ICD-10-CM

## 2017-03-12 DIAGNOSIS — Z87891 Personal history of nicotine dependence: Secondary | ICD-10-CM

## 2017-03-12 DIAGNOSIS — D638 Anemia in other chronic diseases classified elsewhere: Secondary | ICD-10-CM | POA: Diagnosis present

## 2017-03-12 DIAGNOSIS — I4819 Other persistent atrial fibrillation: Secondary | ICD-10-CM | POA: Diagnosis present

## 2017-03-12 DIAGNOSIS — I779 Disorder of arteries and arterioles, unspecified: Secondary | ICD-10-CM | POA: Diagnosis present

## 2017-03-12 DIAGNOSIS — I2510 Atherosclerotic heart disease of native coronary artery without angina pectoris: Secondary | ICD-10-CM | POA: Diagnosis present

## 2017-03-12 DIAGNOSIS — I110 Hypertensive heart disease with heart failure: Secondary | ICD-10-CM | POA: Diagnosis not present

## 2017-03-12 DIAGNOSIS — R0902 Hypoxemia: Secondary | ICD-10-CM

## 2017-03-12 DIAGNOSIS — I739 Peripheral vascular disease, unspecified: Secondary | ICD-10-CM

## 2017-03-12 DIAGNOSIS — J811 Chronic pulmonary edema: Secondary | ICD-10-CM

## 2017-03-12 DIAGNOSIS — Z85118 Personal history of other malignant neoplasm of bronchus and lung: Secondary | ICD-10-CM

## 2017-03-12 DIAGNOSIS — J440 Chronic obstructive pulmonary disease with acute lower respiratory infection: Secondary | ICD-10-CM | POA: Diagnosis present

## 2017-03-12 DIAGNOSIS — D649 Anemia, unspecified: Secondary | ICD-10-CM | POA: Diagnosis present

## 2017-03-12 DIAGNOSIS — I5031 Acute diastolic (congestive) heart failure: Secondary | ICD-10-CM | POA: Diagnosis present

## 2017-03-12 DIAGNOSIS — E785 Hyperlipidemia, unspecified: Secondary | ICD-10-CM | POA: Diagnosis present

## 2017-03-12 DIAGNOSIS — G4733 Obstructive sleep apnea (adult) (pediatric): Secondary | ICD-10-CM | POA: Diagnosis present

## 2017-03-12 DIAGNOSIS — I5043 Acute on chronic combined systolic (congestive) and diastolic (congestive) heart failure: Secondary | ICD-10-CM | POA: Diagnosis present

## 2017-03-12 DIAGNOSIS — Z9981 Dependence on supplemental oxygen: Secondary | ICD-10-CM

## 2017-03-12 DIAGNOSIS — J189 Pneumonia, unspecified organism: Secondary | ICD-10-CM | POA: Diagnosis present

## 2017-03-12 DIAGNOSIS — I5021 Acute systolic (congestive) heart failure: Secondary | ICD-10-CM

## 2017-03-12 DIAGNOSIS — F329 Major depressive disorder, single episode, unspecified: Secondary | ICD-10-CM | POA: Diagnosis present

## 2017-03-12 DIAGNOSIS — I481 Persistent atrial fibrillation: Secondary | ICD-10-CM | POA: Diagnosis present

## 2017-03-12 DIAGNOSIS — I10 Essential (primary) hypertension: Secondary | ICD-10-CM | POA: Diagnosis present

## 2017-03-12 HISTORY — DX: Heart failure, unspecified: I50.9

## 2017-03-12 HISTORY — DX: Acute respiratory failure, unspecified whether with hypoxia or hypercapnia: J96.00

## 2017-03-12 LAB — BASIC METABOLIC PANEL
ANION GAP: 12 (ref 5–15)
BUN: 12 mg/dL (ref 6–20)
CALCIUM: 8.6 mg/dL — AB (ref 8.9–10.3)
CO2: 25 mmol/L (ref 22–32)
Chloride: 99 mmol/L — ABNORMAL LOW (ref 101–111)
Creatinine, Ser: 1.03 mg/dL (ref 0.61–1.24)
GFR calc non Af Amer: >60 mL/min (ref >60)
GLUCOSE: 131 mg/dL — AB (ref 65–99)
POTASSIUM: 3.4 mmol/L — AB (ref 3.5–5.1)
Sodium: 136 mmol/L (ref 135–145)

## 2017-03-12 LAB — CBC WITH DIFFERENTIAL/PLATELET
BASOS ABS: 0 10*3/uL (ref 0.0–0.1)
BASOS PCT: 0 %
Eosinophils Absolute: 0.4 10*3/uL (ref 0.0–0.7)
Eosinophils Relative: 4 %
HEMATOCRIT: 31.2 % — AB (ref 39.0–52.0)
Hemoglobin: 9.9 g/dL — ABNORMAL LOW (ref 13.0–17.0)
Lymphocytes Relative: 15 %
Lymphs Abs: 1.5 10*3/uL (ref 0.7–4.0)
MCH: 28.5 pg (ref 26.0–34.0)
MCHC: 31.7 g/dL (ref 30.0–36.0)
MCV: 89.9 fL (ref 78.0–100.0)
MONO ABS: 1.2 10*3/uL — AB (ref 0.1–1.0)
Monocytes Relative: 12 %
NEUTROS ABS: 6.6 10*3/uL (ref 1.7–7.7)
Neutrophils Relative %: 69 %
PLATELETS: 321 10*3/uL (ref 150–400)
RBC: 3.47 MIL/uL — AB (ref 4.22–5.81)
RDW: 15.7 % — AB (ref 11.5–15.5)
WBC: 9.7 10*3/uL (ref 4.0–10.5)

## 2017-03-12 LAB — MAGNESIUM: MAGNESIUM: 1.5 mg/dL — AB (ref 1.7–2.4)

## 2017-03-12 LAB — I-STAT TROPONIN, ED: Troponin i, poc: 0.01 ng/mL (ref 0.00–0.08)

## 2017-03-12 LAB — I-STAT CG4 LACTIC ACID, ED: LACTIC ACID, VENOUS: 1.35 mmol/L (ref 0.5–1.9)

## 2017-03-12 LAB — BRAIN NATRIURETIC PEPTIDE: B Natriuretic Peptide: 609.6 pg/mL — ABNORMAL HIGH (ref 0.0–100.0)

## 2017-03-12 MED ORDER — SODIUM CHLORIDE 0.9% FLUSH
3.0000 mL | Freq: Two times a day (BID) | INTRAVENOUS | Status: DC
  Administered 2017-03-12 – 2017-03-17 (×11): 3 mL via INTRAVENOUS

## 2017-03-12 MED ORDER — POTASSIUM CHLORIDE CRYS ER 10 MEQ PO TBCR
20.0000 meq | EXTENDED_RELEASE_TABLET | Freq: Every day | ORAL | Status: DC
  Administered 2017-03-13: 20 meq via ORAL
  Filled 2017-03-12: qty 2

## 2017-03-12 MED ORDER — SODIUM CHLORIDE 0.9 % IV SOLN: 250.0000 mL | INTRAVENOUS | Status: DC | PRN

## 2017-03-12 MED ORDER — MAGNESIUM SULFATE 2 GM/50ML IV SOLN
2.0000 g | Freq: Once | INTRAVENOUS | Status: AC
  Administered 2017-03-12: 2 g via INTRAVENOUS
  Filled 2017-03-12: qty 50

## 2017-03-12 MED ORDER — LORAZEPAM 0.5 MG PO TABS
0.5000 mg | ORAL_TABLET | Freq: Once | ORAL | Status: AC
  Administered 2017-03-12: 0.5 mg via ORAL
  Filled 2017-03-12: qty 1

## 2017-03-12 MED ORDER — ORAL CARE MOUTH RINSE
15.0000 mL | Freq: Two times a day (BID) | OROMUCOSAL | Status: DC
  Administered 2017-03-12 – 2017-03-18 (×9): 15 mL via OROMUCOSAL

## 2017-03-12 MED ORDER — VITAMIN D (ERGOCALCIFEROL) 1.25 MG (50000 UNIT) PO CAPS: 50000.0000 [IU] | ORAL_CAPSULE | ORAL | Status: DC

## 2017-03-12 MED ORDER — ATORVASTATIN CALCIUM 40 MG PO TABS
40.0000 mg | ORAL_TABLET | Freq: Every evening | ORAL | Status: DC
  Administered 2017-03-12 – 2017-03-17 (×6): 40 mg via ORAL
  Filled 2017-03-12 (×6): qty 1

## 2017-03-12 MED ORDER — TAMSULOSIN HCL 0.4 MG PO CAPS
0.4000 mg | ORAL_CAPSULE | Freq: Every day | ORAL | Status: DC
  Administered 2017-03-12 – 2017-03-17 (×6): 0.4 mg via ORAL
  Filled 2017-03-12 (×6): qty 1

## 2017-03-12 MED ORDER — PANTOPRAZOLE SODIUM 40 MG PO TBEC
40.0000 mg | DELAYED_RELEASE_TABLET | Freq: Every day | ORAL | Status: DC
  Administered 2017-03-12 – 2017-03-18 (×7): 40 mg via ORAL
  Filled 2017-03-12 (×7): qty 1

## 2017-03-12 MED ORDER — APIXABAN 5 MG PO TABS
5.0000 mg | ORAL_TABLET | Freq: Two times a day (BID) | ORAL | Status: DC
  Administered 2017-03-12 – 2017-03-18 (×12): 5 mg via ORAL
  Filled 2017-03-12 (×12): qty 1

## 2017-03-12 MED ORDER — METOPROLOL TARTRATE 5 MG/5ML IV SOLN
5.0000 mg | Freq: Once | INTRAVENOUS | Status: AC
  Administered 2017-03-12: 5 mg via INTRAVENOUS
  Filled 2017-03-12: qty 5

## 2017-03-12 MED ORDER — HYDROCORTISONE 2.5 % RE CREA
TOPICAL_CREAM | Freq: Three times a day (TID) | RECTAL | Status: DC
  Administered 2017-03-12: 22:00:00 via RECTAL
  Administered 2017-03-12: 1 via RECTAL
  Administered 2017-03-13: 22:00:00 via RECTAL
  Administered 2017-03-13: 1 via RECTAL
  Administered 2017-03-13 – 2017-03-15 (×5): via RECTAL
  Administered 2017-03-15: 1 via RECTAL
  Administered 2017-03-16 – 2017-03-18 (×5): via RECTAL
  Filled 2017-03-12 (×2): qty 28.35

## 2017-03-12 MED ORDER — FUROSEMIDE 10 MG/ML IJ SOLN
80.0000 mg | Freq: Once | INTRAMUSCULAR | Status: AC
  Administered 2017-03-12: 80 mg via INTRAVENOUS
  Filled 2017-03-12: qty 8

## 2017-03-12 MED ORDER — SODIUM CHLORIDE 0.9% FLUSH: 3.0000 mL | INTRAVENOUS | Status: DC | PRN

## 2017-03-12 MED ORDER — DILTIAZEM HCL ER COATED BEADS 180 MG PO CP24
180.0000 mg | ORAL_CAPSULE | Freq: Every day | ORAL | Status: DC
Start: 2017-03-13 — End: 2017-03-14
  Administered 2017-03-13 – 2017-03-14 (×2): 180 mg via ORAL
  Filled 2017-03-12 (×2): qty 1

## 2017-03-12 MED ORDER — HYDRALAZINE HCL 20 MG/ML IJ SOLN: 10.0000 mg | Freq: Three times a day (TID) | INTRAMUSCULAR | Status: DC | PRN

## 2017-03-12 MED ORDER — ONDANSETRON HCL 4 MG/2ML IJ SOLN: 4.0000 mg | Freq: Four times a day (QID) | INTRAMUSCULAR | Status: DC | PRN

## 2017-03-12 MED ORDER — FUROSEMIDE 10 MG/ML IJ SOLN
80.0000 mg | Freq: Two times a day (BID) | INTRAMUSCULAR | Status: DC
  Administered 2017-03-12 – 2017-03-17 (×10): 80 mg via INTRAVENOUS
  Filled 2017-03-12 (×10): qty 8

## 2017-03-12 MED ORDER — GABAPENTIN 600 MG PO TABS
600.0000 mg | ORAL_TABLET | Freq: Three times a day (TID) | ORAL | Status: DC
  Administered 2017-03-12 – 2017-03-18 (×18): 600 mg via ORAL
  Filled 2017-03-12 (×19): qty 1

## 2017-03-12 MED ORDER — ACETAMINOPHEN 325 MG PO TABS
650.0000 mg | ORAL_TABLET | ORAL | Status: DC | PRN
  Administered 2017-03-12: 650 mg via ORAL
  Filled 2017-03-12: qty 2

## 2017-03-12 MED ORDER — SERTRALINE HCL 100 MG PO TABS
100.0000 mg | ORAL_TABLET | Freq: Every day | ORAL | Status: DC
  Administered 2017-03-13 – 2017-03-18 (×6): 100 mg via ORAL
  Filled 2017-03-12 (×6): qty 1

## 2017-03-12 NOTE — Telephone Encounter (Signed)
Called patient about  his request. Informed patient that he would need to follow-up with his PCP about refilling his Flomax. Patient then proceeded with a multitude of questions about his Potassium, A. Fib, and possible cardioversion. Tried to answer all of patient's questions, but also informed patient to keep his follow-up appointments with Dr. Cyndia Bent and Dr. Irish Lack, and present his questions to them as needed. Patient thanked me for my time and will keep his follow up appointments.

## 2017-03-12 NOTE — ED Notes (Signed)
ED Provider at bedside. 

## 2017-03-12 NOTE — ED Provider Notes (Signed)
[place] DEPT Provider Note   CSN: [id number] Arrival date & time: 03/12/17  1142     History   Chief Complaint Chief Complaint  Patient presents with  . Shortness of Breath    HPI Paul Valdez is a 71 y.o. male.  Patient's a 71 year old male with a complicated past medical history. He had a left upper lobe lobectomy on February 5 of this year secondary to squamous cell carcinoma. He then was admitted later in February and discharged on February 21 with healthcare associated pneumonia. He is oxygen dependent on 3 L/m home. He has a history of COPD, CHF, paroxysmal atrial fibrillation on Eliquis, hypertension and hyperlipidemia. He presents with worsening shortness of breath. He reports that over the last several days he's noted increase in his shortness of breath. He walks to answer the door today and noticed that his oxygen saturations dropped to about 65% any became acutely short of breath. He's feeling better now. He was on his oxygen when he walked to the door earlier. He denies any increase in his leg edema. No fevers. He has little bit of a cough which is nonproductive. No nausea vomiting or diarrhea.      Past Medical History:  Diagnosis Date  . Arthritis   . Asthma   . Atrial fibrillation ([place])   . Atypical chest pain    a. Normal nuc 2014.  . Carotid artery disease ([place])    a. Carotid duplex 2015: 38-18% RICA, 2-99% LICA.   Marland Kitchen Cataract   . Coronary artery calcification seen on CT scan   . Depression   . Detached retina   . Former consumption of alcohol   . Former tobacco use   . GERD (gastroesophageal reflux disease)    "I take heart burn medicine"  . Glaucoma   . Hemorrhoids 03/09/2017  . Hyperlipemia   . Hypertension   . Low back pain 12/29/2016  . Mass of upper lobe of left lung 12/29/2016  . PVD (peripheral vascular disease) ([place])    a. Mild plaque of iliacs in 2013 on duplex; PET 2018:  PET also corroborated coronary, aortic arch, and branch vessel  atherosclerotic vascular disease as well as aortoiliac atherosclerotic vascular disease.  . Sleep apnea    has not gotten CPAP yet    Patient Active Problem List   Diagnosis Date Noted  . Hemorrhoids 03/09/2017  . Lung cancer ([place]) 02/15/2017  . Squamous cell carcinoma lung, left ([place]) 02/15/2017  . Acute on chronic diastolic congestive heart failure ([place]) 02/15/2017  . Complicated UTI (urinary tract infection) 02/15/2017  . Left rib fracture 02/15/2017  . Elevated brain natriuretic peptide (BNP) level   . HCAP (healthcare-associated pneumonia)   . Lobar pneumonia ([place])   . Atrial fibrillation ([place]) 02/12/2017  . Atelectasis   . OSA (obstructive sleep apnea)   . S/P lobectomy of lung 02/01/2017  . Coronary artery calcification seen on CT scan 01/13/2017  . PVD (peripheral vascular disease) ([place]) 01/13/2017  . Essential hypertension 01/13/2017  . Hyperlipidemia 01/13/2017  . Nodule of left lung 12/29/2016  . Low back pain 12/29/2016  . Carotid artery disease ([place]) 11/21/2014    Past Surgical History:  Procedure Laterality Date  . APPENDECTOMY    . COLONOSCOPY    . EYE SURGERY    . FLEXIBLE BRONCHOSCOPY N/A 02/01/2017   Procedure: FLEXIBLE BRONCHOSCOPY;  Surgeon: Gaye Pollack, MD;  Location: [facility] OR;  Service: Thoracic;  Laterality: N/A;  . GANGLION CYST EXCISION  left hand  . HERNIA REPAIR     double hernia repair  . THORACOTOMY/LOBECTOMY Left 02/01/2017   Procedure: THORACOTOMY/LEFT UPPER LOBECTOMY;  Surgeon: Gaye Pollack, MD;  Location: [facility] OR;  Service: Thoracic;  Laterality: Left;  . TUMOR REMOVAL     non cancer tumor from neck       Home Medications    Prior to Admission medications   Medication Sig Start Date End Date Taking? Authorizing Provider  apixaban (ELIQUIS) 5 MG TABS tablet Take 1 tablet (5 mg total) by mouth 2 (two) times daily. 03/01/17  Yes Imogene Burn, PA-C  atorvastatin (LIPITOR) 40 MG tablet Take 1 tablet (40 mg total) by mouth every  evening. 03/01/17  Yes Imogene Burn, PA-C  cyanocobalamin (,VITAMIN B-12,) 1000 MCG/ML injection Inject 1 mL (1,000 mcg total) into the skin every 30 (thirty) days. Please go to your Pimary MD's office for this injection 02/17/17  Yes Shanker Kristeen Mans, MD  diltiazem (CARDIZEM CD) 180 MG 24 hr capsule Take 1 capsule (180 mg total) by mouth daily. 03/01/17  Yes Imogene Burn, PA-C  furosemide (LASIX) 40 MG tablet Take 1 tablet (40 mg total) by mouth daily. 03/01/17  Yes Imogene Burn, PA-C  gabapentin (NEURONTIN) 300 MG capsule Take 2 capsules (600 mg total) by mouth 3 (three) times daily. 02/17/17  Yes Shanker Kristeen Mans, MD  hydrocortisone (ANUSOL-HC) 2.5 % rectal cream Place rectally 3 (three) times daily. For 5 days only. 02/17/17  Yes Shanker Kristeen Mans, MD  omeprazole (PRILOSEC) 20 MG capsule Take 1 capsule (20 mg total) by mouth every evening. 02/17/17  Yes Shanker Kristeen Mans, MD  Potassium Chloride ER 20 MEQ TBCR Take 20 mEq by mouth daily. 03/01/17  Yes Imogene Burn, PA-C  sertraline (ZOLOFT) 100 MG tablet Take 1 tablet (100 mg total) by mouth daily. 02/17/17  Yes Shanker Kristeen Mans, MD  tamsulosin (FLOMAX) 0.4 MG CAPS capsule Take 1 capsule (0.4 mg total) by mouth daily after supper. 02/17/17  Yes Shanker Kristeen Mans, MD  Vitamin D, Ergocalciferol, (DRISDOL) 50000 units CAPS capsule Take 1 capsule (50,000 Units total) by mouth every 7 (seven) days. Every Friday 02/17/17  Yes Shanker Kristeen Mans, MD    Family History Family History  Problem Relation Age of Onset  . Colon cancer Father   . Hypertension Father   . Hypertension Mother   . Colon cancer Sister   . Colon cancer Brother     Social History Social History  Substance Use Topics  . Smoking status: Former Research scientist (life sciences)  . Smokeless tobacco: Never Used  . Alcohol use No     Allergies   Colchicine   Review of Systems Review of Systems  Constitutional: Positive for fatigue. Negative for chills, diaphoresis and fever.  HENT: Negative for  congestion, rhinorrhea and sneezing.   Eyes: Negative.   Respiratory: Positive for cough and shortness of breath. Negative for chest tightness.   Cardiovascular: Negative for chest pain and leg swelling.  Gastrointestinal: Negative for abdominal pain, blood in stool, diarrhea, nausea and vomiting.  Genitourinary: Negative for difficulty urinating, flank pain, frequency and hematuria.  Musculoskeletal: Negative for arthralgias and back pain.  Skin: Negative for rash.  Neurological: Negative for dizziness, speech difficulty, weakness, numbness and headaches.     Physical Exam Updated Vital Signs BP 135/80 (BP Location: Right Arm)   Pulse (!) 122   Temp 99.4 F (37.4 C) (Oral)   Resp (!) 22   SpO2 Marland Kitchen)  88%   Physical Exam  Constitutional: He is oriented to person, place, and time. He appears well-developed and well-nourished.  HENT:  Head: Normocephalic and atraumatic.  Eyes: Pupils are equal, round, and reactive to light.  Neck: Normal range of motion. Neck supple.  Cardiovascular: Normal rate, regular rhythm and normal heart sounds.   Pulmonary/Chest: Effort normal. No respiratory distress. He has no wheezes. He has rales. He exhibits no tenderness.  Abdominal: Soft. Bowel sounds are normal. There is no tenderness. There is no rebound and no guarding.  Musculoskeletal: Normal range of motion. He exhibits edema (Trace edema bilaterally).  Lymphadenopathy:    He has no cervical adenopathy.  Neurological: He is alert and oriented to person, place, and time.  Skin: Skin is warm and dry. No rash noted.  Psychiatric: He has a normal mood and affect.     ED Treatments / Results  Labs (all labs ordered are listed, but only abnormal results are displayed) Labs Reviewed  CBC WITH DIFFERENTIAL/PLATELET - Abnormal; Notable for the following:       Result Value   RBC 3.47 (*)    Hemoglobin 9.9 (*)    HCT 31.2 (*)    RDW 15.7 (*)    Monocytes Absolute 1.2 (*)    All other components  within normal limits  BASIC METABOLIC PANEL - Abnormal; Notable for the following:    Potassium 3.4 (*)    Chloride 99 (*)    Glucose, Bld 131 (*)    Calcium 8.6 (*)    All other components within normal limits  BRAIN NATRIURETIC PEPTIDE - Abnormal; Notable for the following:    B Natriuretic Peptide 609.6 (*)    All other components within normal limits  I-STAT CG4 LACTIC ACID, ED  I-STAT TROPOININ, ED    EKG  EKG Interpretation  Date/Time:  Friday March 12 2017 11:48:49 EDT Ventricular Rate:  117 PR Interval:    QRS Duration: 88 QT Interval:  346 QTC Calculation: 482 R Axis:   81 Text Interpretation:  Atrial fibrillation with rapid ventricular response Abnormal ECG since last tracing no significant change Confirmed by Ibrahem Volkman  MD, Janey Petron ([id number]) on 03/12/2017 1:16:17 PM       Radiology Dg Chest 2 View  Result Date: 03/12/2017 CLINICAL DATA:  Low O2 sats. Prior left upper lobectomy. Shortness of breath. EXAM: CHEST  2 VIEW COMPARISON:  02/24/2017 FINDINGS: Postoperative changes in the left upper lung. Diffuse airspace disease and interstitial prominence throughout both lungs. This could represent edema or infection. Mild cardiomegaly. Large hiatal hernia. No effusions. IMPRESSION: Diffuse interstitial and alveolar opacities throughout the lungs which could reflect edema or infection. Large hiatal hernia. Electronically Signed   By: Rolm Baptise M.D.   On: 03/12/2017 12:27    Procedures Procedures (including critical care time)  Medications Ordered in ED Medications  furosemide (LASIX) injection 80 mg (not administered)     Initial Impression / Assessment and Plan / ED Course  I have reviewed the triage vital signs and the nursing notes.  Pertinent labs & imaging results that were available during my care of the patient were reviewed by me and considered in my medical decision making (see chart for details).     Patients presents with shortness of breath. His oxygen  saturations had dropped while he is in the ED and now is requiring 4 L/m to maintain oxygen saturation of 90%. His chest x-ray shows increasing edema versus infection. His BNP is elevated. He doesn't have  any other suggestions of pneumonia. We'll give him a dose of Lasix. I will consult the hospitalist for admission.  He is in atrial fibrillation when he has been in the past. His rate is between 90 and 100.  I spoke with the hospitalist APP who will admit the pt.  She will see pt and decide whether she wants to start abx.  Final Clinical Impressions(s) / ED Diagnoses   Final diagnoses:  Hypoxia  Acute systolic congestive heart failure [facility])    New Prescriptions New Prescriptions   No medications on file     Malvin Johns, MD 03/12/17 1458

## 2017-03-12 NOTE — Telephone Encounter (Signed)
New Message   Per pt nurse was supposed to refill tamsulosin (FLOMAX) 0.4 MG CAPS capsule, and it didn't get filled. He is now questioning if he needs to continue to take it. Requesting call back from the nurse.

## 2017-03-12 NOTE — ED Notes (Signed)
Attempted report 

## 2017-03-12 NOTE — Progress Notes (Signed)
Paul Valdez admitted to room 3E10 @ 1650.  Denies pain.  Oriented to room.

## 2017-03-12 NOTE — Telephone Encounter (Signed)
New Message    Per [facility] nurse with [facility] called to state patient's oxygen dropped to 75 with activity and after resting for 5 minutes went back up to 80's. Requesting call back

## 2017-03-12 NOTE — ED Triage Notes (Signed)
Pt had left upper lobe removed in February for cancer. Wears 3 L oxygen. Today he got up to answer the door for his home health nurse and when he returned to check his oxygen saturation was 65%. Pt is a x 4. Denies any cp. SOB is worse than normal.

## 2017-03-12 NOTE — Telephone Encounter (Signed)
Called Surveyor, quantity at [facility]. She stated that patient was SOB and O2 was at 85% on 3L [place]. Patient is normally on 3L, asked nurse if she has called PCP. She stated that she was waiting for them to call back. Elmyra Ricks thinks patient should go to the hospital. Informed Elmyra Ricks if patient is having SOB, O2 85%, and patient is having trouble breathing that going to the hospital is a good idea. Elmyra Ricks verbalized understanding.

## 2017-03-12 NOTE — H&P (Signed)
History and Physical    Paul Valdez:[id number] DOB: Jun 08, 1946 DOA: 03/12/2017  PCP: Gara Kroner, MD Patient coming from: home  Chief Complaint: sob  HPI: Paul Valdez is a very pleasant  71 y.o. male with medical history significant for hypertension, hyperlipidemia, CAD, COPD, A. fib, mass in his upper lobe of left lung status post lobectomy this emergency Department chief complaint of worsening shortness of breath. Initial evaluation reveals acute on chronic respiratory failure with hypoxia likely related to acute on chronic diastolic heart failure.  Information is obtained from the patient. He reports early February he had an upper lobe lobectomy secondary to squama cell carcinoma. He was discharged and then later admitted with healthcare associated pneumonia. He was discharged from  hospitalization on 3 L of oxygen. He reports this morning his home health nurse came over he got up went to the door went back to his chair when the RN checked his oxygen saturation level she reported it was 65%. He denied headache dizziness syncope or near-syncope. He denied chest pain palpitations cough fever chills lower extremity edema. He reports compliance with his oxygen at home but at the time of admission is wondering if perhaps the machine is "malfunctioning". He reports mild intermittent nonproductive cough. He denies abdominal pain nausea vomiting diarrhea constipation. He denies dysuria hematuria frequency or urgency   ED Course: In the emergency department his temperature is 99.4 hemodynamically stable oxygen saturation level is 88% on 3 L nasal cannula. He is provided with Lasix intravenously and oxygen supplementation is increased to 4 L.  Review of Systems: As per HPI otherwise 10 point review of systems negative.   Ambulatory Status: Ambulates independently  Past Medical History:  Diagnosis Date  . Acute respiratory failure ([place])   . Arthritis   . Asthma   . Atrial  fibrillation ([place])   . Atypical chest pain    a. Normal nuc 2014.  . Carotid artery disease ([place])    a. Carotid duplex 2015: 29-79% RICA, 8-92% LICA.   Marland Kitchen Cataract   . Coronary artery calcification seen on CT scan   . Depression   . Detached retina   . Former consumption of alcohol   . Former tobacco use   . GERD (gastroesophageal reflux disease)    "I take heart burn medicine"  . Glaucoma   . Hemorrhoids 03/09/2017  . Hyperlipemia   . Hypertension   . Low back pain 12/29/2016  . Mass of upper lobe of left lung 12/29/2016  . PVD (peripheral vascular disease) ([place])    a. Mild plaque of iliacs in 2013 on duplex; PET 2018:  PET also corroborated coronary, aortic arch, and branch vessel atherosclerotic vascular disease as well as aortoiliac atherosclerotic vascular disease.  . Sleep apnea    has not gotten CPAP yet    Past Surgical History:  Procedure Laterality Date  . APPENDECTOMY    . COLONOSCOPY    . EYE SURGERY    . FLEXIBLE BRONCHOSCOPY N/A 02/01/2017   Procedure: FLEXIBLE BRONCHOSCOPY;  Surgeon: Gaye Pollack, MD;  Location: [facility] OR;  Service: Thoracic;  Laterality: N/A;  . GANGLION CYST EXCISION     left hand  . HERNIA REPAIR     double hernia repair  . THORACOTOMY/LOBECTOMY Left 02/01/2017   Procedure: THORACOTOMY/LEFT UPPER LOBECTOMY;  Surgeon: Gaye Pollack, MD;  Location: [facility] OR;  Service: Thoracic;  Laterality: Left;  . TUMOR REMOVAL     non cancer tumor from neck  Social History   Social History  . Marital status: Single    Spouse name: N/A  . Number of children: N/A  . Years of education: N/A   Occupational History  . Not on file.   Social History Main Topics  . Smoking status: Former Research scientist (life sciences)  . Smokeless tobacco: Never Used  . Alcohol use No  . Drug use: No  . Sexual activity: Not on file   Other Topics Concern  . Not on file   Social History Narrative  . No narrative on file    Allergies  Allergen Reactions  . Colchicine Diarrhea    Family  History  Problem Relation Age of Onset  . Colon cancer Father   . Hypertension Father   . Hypertension Mother   . Colon cancer Sister   . Colon cancer Brother     Prior to Admission medications   Medication Sig Start Date End Date Taking? Authorizing Provider  apixaban (ELIQUIS) 5 MG TABS tablet Take 1 tablet (5 mg total) by mouth 2 (two) times daily. 03/01/17  Yes Imogene Burn, PA-C  atorvastatin (LIPITOR) 40 MG tablet Take 1 tablet (40 mg total) by mouth every evening. 03/01/17  Yes Imogene Burn, PA-C  cyanocobalamin (,VITAMIN B-12,) 1000 MCG/ML injection Inject 1 mL (1,000 mcg total) into the skin every 30 (thirty) days. Please go to your Pimary MD's office for this injection 02/17/17  Yes Shanker Kristeen Mans, MD  diltiazem (CARDIZEM CD) 180 MG 24 hr capsule Take 1 capsule (180 mg total) by mouth daily. 03/01/17  Yes Imogene Burn, PA-C  furosemide (LASIX) 40 MG tablet Take 1 tablet (40 mg total) by mouth daily. 03/01/17  Yes Imogene Burn, PA-C  gabapentin (NEURONTIN) 300 MG capsule Take 2 capsules (600 mg total) by mouth 3 (three) times daily. 02/17/17  Yes Shanker Kristeen Mans, MD  hydrocortisone (ANUSOL-HC) 2.5 % rectal cream Place rectally 3 (three) times daily. For 5 days only. 02/17/17  Yes Shanker Kristeen Mans, MD  omeprazole (PRILOSEC) 20 MG capsule Take 1 capsule (20 mg total) by mouth every evening. 02/17/17  Yes Shanker Kristeen Mans, MD  Potassium Chloride ER 20 Valdez TBCR Take 20 Valdez by mouth daily. 03/01/17  Yes Imogene Burn, PA-C  sertraline (ZOLOFT) 100 MG tablet Take 1 tablet (100 mg total) by mouth daily. 02/17/17  Yes Shanker Kristeen Mans, MD  tamsulosin (FLOMAX) 0.4 MG CAPS capsule Take 1 capsule (0.4 mg total) by mouth daily after supper. 02/17/17  Yes Shanker Kristeen Mans, MD  Vitamin D, Ergocalciferol, (DRISDOL) 50000 units CAPS capsule Take 1 capsule (50,000 Units total) by mouth every 7 (seven) days. Every Friday 02/17/17  Yes Shanker Kristeen Mans, MD    Physical Exam: Vitals:   03/12/17  1515 03/12/17 1530 03/12/17 1543 03/12/17 1615  BP: 133/83 117/87  134/82  Pulse: (!) 105 (!) 106  (!) 107  Resp: _0 Temp:   99.2 F (37.3 C)   TempSrc:   Oral   SpO2: 92% 91%  92%     General:  Appears calm Sitting up in bed slight increased work of breathing no acute distress Eyes:  PERRL, EOMI, normal lids, iris ENT:  grossly normal hearing, lips & tongue, his membranes of his mouth are pink and moist Neck:  no LAD, masses or thyromegaly Cardiovascular:  Irregularly irregular, no m/r/g. No LE edema.  Respiratory:  Mild increased work of breathing with conversation. Breath sounds with fine crackles bilateral  bases and on right upper lobes no wheeze no rhonchi Abdomen:  soft, ntnd, positive bowel sounds Skin:  no rash or induration seen on limited exam Musculoskeletal:  grossly normal tone BUE/BLE, good ROM, no bony abnormality Psychiatric:  grossly normal mood and affect, speech fluent and appropriate, AOx3 Neurologic:  CN 2-12 grossly intact, moves all extremities in coordinated fashion, sensation intact  Labs on Admission: I have personally reviewed following labs and imaging studies  CBC:  Recent Labs Lab 03/09/17 1207 03/12/17 1149  WBC 9.4 9.7  NEUTROABS 6.6* 6.6  HGB 11.1* 9.9*  HCT 34.1* 31.2*  MCV 89.5 89.9  PLT 228 081   Basic Metabolic Panel:  Recent Labs Lab 03/09/17 1207 03/12/17 1149  NA 138 136  K 3.9 3.4*  CL  --  99*  CO2 29 25  GLUCOSE 111 131*  BUN 12.5 12  CREATININE 1.0 1.03  CALCIUM 9.6 8.6*  MG  --  1.5*   GFR: Estimated Creatinine Clearance: 71.1 mL/min (by C-G formula based on SCr of 1.03 mg/dL). Liver Function Tests:  Recent Labs Lab 03/09/17 1207  AST 15  ALT 13  ALKPHOS 90  BILITOT 0.86  PROT 7.3  ALBUMIN 2.9*   No results for input(s): LIPASE, AMYLASE in the last 168 hours. No results for input(s): AMMONIA in the last 168 hours. Coagulation Profile: No results for input(s): INR, PROTIME in the last 168  hours. Cardiac Enzymes: No results for input(s): CKTOTAL, CKMB, CKMBINDEX, TROPONINI in the last 168 hours. BNP (last 3 results) No results for input(s): PROBNP in the last 8760 hours. HbA1C: No results for input(s): HGBA1C in the last 72 hours. CBG: No results for input(s): GLUCAP in the last 168 hours. Lipid Profile: No results for input(s): CHOL, HDL, LDLCALC, TRIG, CHOLHDL, LDLDIRECT in the last 72 hours. Thyroid Function Tests: No results for input(s): TSH, T4TOTAL, FREET4, T3FREE, THYROIDAB in the last 72 hours. Anemia Panel: No results for input(s): VITAMINB12, FOLATE, FERRITIN, TIBC, IRON, RETICCTPCT in the last 72 hours. Urine analysis:    Component Value Date/Time   COLORURINE AMBER (A) 02/15/2017 0344   APPEARANCEUR HAZY (A) 02/15/2017 0344   LABSPEC 1.016 02/15/2017 0344   PHURINE 5.0 02/15/2017 0344   GLUCOSEU NEGATIVE 02/15/2017 0344   HGBUR MODERATE (A) 02/15/2017 0344   BILIRUBINUR NEGATIVE 02/15/2017 0344   KETONESUR NEGATIVE 02/15/2017 0344   PROTEINUR 30 (A) 02/15/2017 0344   NITRITE POSITIVE (A) 02/15/2017 0344   LEUKOCYTESUR SMALL (A) 02/15/2017 0344    Creatinine Clearance: Estimated Creatinine Clearance: 71.1 mL/min (by C-G formula based on SCr of 1.03 mg/dL).  Sepsis Labs: _0 (procalcitonin:4,lacticidven:4) )No results found for this or any previous visit (from the past 240 hour(s)).   Radiological Exams on Admission: Dg Chest 2 View  Result Date: 03/12/2017 CLINICAL DATA:  Low O2 sats. Prior left upper lobectomy. Shortness of breath. EXAM: CHEST  2 VIEW COMPARISON:  02/24/2017 FINDINGS: Postoperative changes in the left upper lung. Diffuse airspace disease and interstitial prominence throughout both lungs. This could represent edema or infection. Mild cardiomegaly. Large hiatal hernia. No effusions. IMPRESSION: Diffuse interstitial and alveolar opacities throughout the lungs which could reflect edema or infection. Large hiatal hernia.  Electronically Signed   By: Rolm Baptise M.D.   On: 03/12/2017 12:27    EKG: Independently reviewed. Atrial fibrillation with rapid ventricular response Abnormal ECG  Assessment/Plan Principal Problem:   Acute respiratory failure [facility]) Active Problems:   Carotid artery disease (H[facility])   Essential hypertension  Hyperlipidemia   OSA (obstructive sleep apnea)   Atrial fibrillation ([facility])   Lung cancer (H[facility])   Acute on chronic diastolic congestive heart failure ([facility])   Elevated brain natriuretic peptide (BNP) level   Anemia   #1. Acute respiratory failure with hypoxia likely related to acute on chronic diastolic heart failure in the setting of recent left upper lobe lobectomy. Oxygen saturation level 88% on 3 L in the emergency department. Chest x-ray with diffuse interstitial and alvolar opacities throughout the lungs which could reflect edema or infection per report. Temperature 99.4 no leukocytosis lactic acid within the limits of normal is hemodynamically stable. BnP slightly elevated -Admit to telemetry -Continue oxygen at 4l -Continue IV Lasix -Monitor intake and output -Obtain daily weights -hold home lasix for now -monitor kidney function -no indication for antibiotic at this time.   #2. Acute on chronic diastolic heart failure. Echo done in February 2018 reveals EF of 60% mild LVH. Medications include Lasix -IV Lasix as noted above -Monitor intake and output -Obtain daily weights  #3 atrial fibrillation. chadvasc score 5. Rate fairly well controlled. Home medications include diltiazem. Patient is on eliquis and diltiazem. He reports he has an appointment with his cardiologist next week to discuss "procedure" -Continue home Cardizem -Continue eliquis -monitor  4. Hypertension.fair control in the emergency department. -Continue home meds  #5. Anemia. Likely related to chronic disease. Hemoglobin 9.9 on admission. Chart review indicates this is close to his baseline.   noSigns symptoms of active bleeding -Monitor -OP follow up    DVT prophylaxis: scd  Code Status: full  Family Communication: none present  Disposition Plan: home  Consults called: none  Admission status: obs    Dyanne Carrel M MD [facility]  If 7PM-7AM, please contact night-coverage [email or web address] Password [facility]  03/12/2017, 4:45 PM

## 2017-03-12 NOTE — Progress Notes (Addendum)
Metoprolol 5 mg IV ordered per MD. Will continue to monitor.  Soriyah Osberg, RN

## 2017-03-12 NOTE — ED Notes (Signed)
Pt cleaned after condom catheter spilled urine.  A new condom catheter was applied and is functioning properly.

## 2017-03-12 NOTE — ED Notes (Signed)
Condom catheter placed on the patient with a drainage bag attached.

## 2017-03-12 NOTE — Progress Notes (Signed)
Pt requested something for sleep. Paged MD on call. Medication ordered and given. Ativan 0.5 mg. Will continue to monitor.  Cassadi Purdie, RN

## 2017-03-13 ENCOUNTER — Observation Stay (HOSPITAL_COMMUNITY): Payer: Medicare Other

## 2017-03-13 DIAGNOSIS — Z902 Acquired absence of lung [part of]: Secondary | ICD-10-CM | POA: Diagnosis not present

## 2017-03-13 DIAGNOSIS — Z9981 Dependence on supplemental oxygen: Secondary | ICD-10-CM | POA: Diagnosis not present

## 2017-03-13 DIAGNOSIS — D638 Anemia in other chronic diseases classified elsewhere: Secondary | ICD-10-CM | POA: Diagnosis present

## 2017-03-13 DIAGNOSIS — I110 Hypertensive heart disease with heart failure: Secondary | ICD-10-CM | POA: Diagnosis present

## 2017-03-13 DIAGNOSIS — J9621 Acute and chronic respiratory failure with hypoxia: Secondary | ICD-10-CM | POA: Diagnosis present

## 2017-03-13 DIAGNOSIS — I5021 Acute systolic (congestive) heart failure: Secondary | ICD-10-CM | POA: Diagnosis not present

## 2017-03-13 DIAGNOSIS — Z87891 Personal history of nicotine dependence: Secondary | ICD-10-CM | POA: Diagnosis not present

## 2017-03-13 DIAGNOSIS — Z7901 Long term (current) use of anticoagulants: Secondary | ICD-10-CM | POA: Diagnosis not present

## 2017-03-13 DIAGNOSIS — I480 Paroxysmal atrial fibrillation: Secondary | ICD-10-CM

## 2017-03-13 DIAGNOSIS — I481 Persistent atrial fibrillation: Secondary | ICD-10-CM | POA: Diagnosis present

## 2017-03-13 DIAGNOSIS — I5043 Acute on chronic combined systolic (congestive) and diastolic (congestive) heart failure: Secondary | ICD-10-CM | POA: Diagnosis present

## 2017-03-13 DIAGNOSIS — I4891 Unspecified atrial fibrillation: Secondary | ICD-10-CM | POA: Diagnosis not present

## 2017-03-13 DIAGNOSIS — H409 Unspecified glaucoma: Secondary | ICD-10-CM | POA: Diagnosis present

## 2017-03-13 DIAGNOSIS — R0603 Acute respiratory distress: Secondary | ICD-10-CM | POA: Diagnosis present

## 2017-03-13 DIAGNOSIS — T502X5A Adverse effect of carbonic-anhydrase inhibitors, benzothiadiazides and other diuretics, initial encounter: Secondary | ICD-10-CM | POA: Diagnosis not present

## 2017-03-13 DIAGNOSIS — G4733 Obstructive sleep apnea (adult) (pediatric): Secondary | ICD-10-CM | POA: Diagnosis present

## 2017-03-13 DIAGNOSIS — J440 Chronic obstructive pulmonary disease with acute lower respiratory infection: Secondary | ICD-10-CM | POA: Diagnosis present

## 2017-03-13 DIAGNOSIS — R0602 Shortness of breath: Secondary | ICD-10-CM | POA: Diagnosis present

## 2017-03-13 DIAGNOSIS — J189 Pneumonia, unspecified organism: Secondary | ICD-10-CM | POA: Diagnosis present

## 2017-03-13 DIAGNOSIS — E785 Hyperlipidemia, unspecified: Secondary | ICD-10-CM | POA: Diagnosis present

## 2017-03-13 DIAGNOSIS — F329 Major depressive disorder, single episode, unspecified: Secondary | ICD-10-CM | POA: Diagnosis present

## 2017-03-13 DIAGNOSIS — E876 Hypokalemia: Secondary | ICD-10-CM | POA: Diagnosis not present

## 2017-03-13 DIAGNOSIS — J9600 Acute respiratory failure, unspecified whether with hypoxia or hypercapnia: Secondary | ICD-10-CM | POA: Diagnosis not present

## 2017-03-13 DIAGNOSIS — I2510 Atherosclerotic heart disease of native coronary artery without angina pectoris: Secondary | ICD-10-CM | POA: Diagnosis present

## 2017-03-13 DIAGNOSIS — I5033 Acute on chronic diastolic (congestive) heart failure: Secondary | ICD-10-CM | POA: Diagnosis not present

## 2017-03-13 DIAGNOSIS — Z79899 Other long term (current) drug therapy: Secondary | ICD-10-CM | POA: Diagnosis not present

## 2017-03-13 DIAGNOSIS — K219 Gastro-esophageal reflux disease without esophagitis: Secondary | ICD-10-CM | POA: Diagnosis present

## 2017-03-13 DIAGNOSIS — J9601 Acute respiratory failure with hypoxia: Secondary | ICD-10-CM | POA: Diagnosis not present

## 2017-03-13 DIAGNOSIS — Z85118 Personal history of other malignant neoplasm of bronchus and lung: Secondary | ICD-10-CM | POA: Diagnosis not present

## 2017-03-13 DIAGNOSIS — R0609 Other forms of dyspnea: Secondary | ICD-10-CM | POA: Diagnosis not present

## 2017-03-13 LAB — BASIC METABOLIC PANEL
Anion gap: 9 (ref 5–15)
BUN: 14 mg/dL (ref 6–20)
CO2: 35 mmol/L — ABNORMAL HIGH (ref 22–32)
CREATININE: 1.05 mg/dL (ref 0.61–1.24)
Calcium: 8.7 mg/dL — ABNORMAL LOW (ref 8.9–10.3)
Chloride: 93 mmol/L — ABNORMAL LOW (ref 101–111)
GFR calc Af Amer: >60 mL/min (ref >60)
Glucose, Bld: 112 mg/dL — ABNORMAL HIGH (ref 65–99)
Potassium: 3.4 mmol/L — ABNORMAL LOW (ref 3.5–5.1)
SODIUM: 137 mmol/L (ref 135–145)

## 2017-03-13 LAB — MAGNESIUM: Magnesium: 2.2 mg/dL (ref 1.7–2.4)

## 2017-03-13 LAB — CBC
HEMATOCRIT: 31.3 % — AB (ref 39.0–52.0)
Hemoglobin: 10.1 g/dL — ABNORMAL LOW (ref 13.0–17.0)
MCH: 28.8 pg (ref 26.0–34.0)
MCHC: 32.3 g/dL (ref 30.0–36.0)
MCV: 89.2 fL (ref 78.0–100.0)
Platelets: 300 10*3/uL (ref 150–400)
RBC: 3.51 MIL/uL — ABNORMAL LOW (ref 4.22–5.81)
RDW: 15.7 % — ABNORMAL HIGH (ref 11.5–15.5)
WBC: 6.9 10*3/uL (ref 4.0–10.5)

## 2017-03-13 MED ORDER — LORAZEPAM 0.5 MG PO TABS
0.5000 mg | ORAL_TABLET | Freq: Every day | ORAL | Status: DC
  Administered 2017-03-13 – 2017-03-17 (×5): 0.5 mg via ORAL
  Filled 2017-03-13 (×5): qty 1

## 2017-03-13 NOTE — Progress Notes (Signed)
Pt educated about safety and importance of bed alarm during the night however pt refuses to be on bed alarm. Will continue to round on patient.   Delance Weide, RN    

## 2017-03-13 NOTE — Progress Notes (Signed)
[facility]                                                                              Patient Demographics  Paul Valdez, is a 71 y.o. male, DOB - 1946-12-18, FIE:[id number]  Admit date - 03/12/2017   Admitting Physician Elwin Mocha, MD  Outpatient Primary MD for the patient is Gara Kroner, MD  Outpatient specialists:   LOS - 0  days    Chief Complaint  Patient presents with  . Shortness of Breath       Brief summary   Patient is a 71 year old male with hypertension, hyperlipidemia, CAD, COPD, A. fib, mass in his upper lobe of left lung status post lobectomy presented with shortness of breath. Patient had upper lobe lobectomy in 2/18 secondary to squamous cell carcinoma, subsequently was readmitted again with HCAP  was DC'd on 3 L of O2. He was also discharged home on Lasix 40 mg daily. Per patient he was noticed to have hypoxia with O2 sats of 65% by his home health nurse and was recommended to come to the ER. Otherwise he denied any chest pain, palpitations, coughing or any lower extremity edema.He reports compliance with his oxygen at home but at the time of admission is wondering if perhaps the machine is "malfunctioning". He reports mild intermittent nonproductive cough In the ER, O2 sats were 88% on 3 L and was given Lasix intravenously and admitted for further workup  Assessment & Plan    Principal Problem: Acute respiratory failure with hypoxia likely related to acute on chronic diastolic heart failure, recent left upper lobe lobectomy. -  Chest x-ray with diffuse interstitial and alvolar opacities throughout the lungs which could reflect edema or infection. BNP was elevated 609. Temperature 99.4 no leukocytosis, lactic acid within the limits of normal is hemodynamically stable.  - Patient was admitted and placed on IV diuresis.  Active problems  Acute on chronic diastolic heart failure. Echo done in February 2018 reveals EF of 60% mild LVH.  Patient was discharged on Lasix 40 mg daily  - Continue IV diuresis currently on 80 mg IV BID,  - Continue strict I's and O's and daily weights, negative balance of 3.8 L - Weight down from 194 on admission to 185 today (was 189 lbs on the day of discharge on 2/19) - Likely will need higher dose of Lasix at the time of discharge.   Atrial fibrillation - Rate controlled, continue Cardizem -  chadvasc score 5. continue eliquis  Hypertension. -Continue home meds  Anemia. Likely related to chronic disease. Hemoglobin 9.9 on admission. Chart review indicates this is close to his baseline.    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  - Patient following Dr. Julien Nordmann and Dr Cyndia Bent   Dyslipidemia  continue statin   Code Status: full  DVT Prophylaxis:  SCD's Family Communication: Discussed in detail with the patient, all imaging results, lab results explained to the patient    Disposition Plan: Possible DC home in a.m. Will need home O2 evaluation prior to DC  Time Spent in minutes   25 minutes  Procedures:  Consultants:   None  Antimicrobials:      Medications  Scheduled Meds: . apixaban  5 mg Oral BID  . atorvastatin  40 mg Oral QPM  . diltiazem  180 mg Oral Daily  . furosemide  80 mg Intravenous BID  . gabapentin  600 mg Oral TID  . hydrocortisone   Rectal TID  . LORazepam  0.5 mg Oral QHS  . mouth rinse  15 mL Mouth Rinse BID  . pantoprazole  40 mg Oral Daily  . potassium chloride  20 mEq Oral Daily  . sertraline  100 mg Oral Daily  . sodium chloride flush  3 mL Intravenous Q12H  . tamsulosin  0.4 mg Oral QPC supper  . [START ON 03/12/2017] Vitamin D (Ergocalciferol)  50,000 Units Oral Q7 days   Continuous Infusions: PRN Meds:.sodium chloride, acetaminophen, hydrALAZINE, ondansetron (ZOFRAN) IV, sodium chloride flush   Antibiotics   Anti-infectives    None        Subjective:   Paul Valdez was seen and examined  today. Feeling a lot better today, shortness of breath is improving however has not ambulated yet.  Patient denies dizziness, abdominal pain, N/V/D/C, new weakness, numbess, tingling. No acute events overnight.    Objective:   Vitals:   03/12/17 2122 03/13/17 0112 03/13/17 0648 03/13/17 1000  BP: 110/72 135/81 122/72 121/71  Pulse: (!) 112 98 (!) 103 (!) 102  Resp: 20 (!) 22 (!) 22   Temp: 99.2 F (37.3 C)  98 F (36.7 C)   TempSrc: Oral  Oral   SpO2: 92% 92% 92%   Weight:   84.2 kg (185 lb 9.6 oz)   Height:        Intake/Output Summary (Last 24 hours) at 03/13/17 1110 Last data filed at 03/13/17 1052  Gross per 24 hour  Intake              363 ml  Output             4170 ml  Net            -3807 ml     Wt Readings from Last 3 Encounters:  03/13/17 84.2 kg (185 lb 9.6 oz)  03/09/17 88.4 kg (194 lb 12.8 oz)  03/01/17 89.3 kg (196 lb 12.8 oz)     Exam  General: Alert and oriented x 3, NAD  HEENT:     Neck: Supple, no JVD, no masses  Cardiovascular: S1 S2 auscultated, no rubs, murmurs or gallops. Regular rate and rhythm.  Respiratory:Decreased breath sounds at the bases   Gastrointestinal: Soft, nontender, nondistended, + bowel sounds  Ext: no cyanosis clubbing or edema  Neuro: AAOx3, Cr N's II- XII. Strength 5/5 upper and lower extremities bilaterally  Skin: No rashes  Psych: Normal affect and demeanor, alert and oriented x3    Data Reviewed:  I have personally reviewed following labs and imaging studies  Micro Results No results found for this or any previous visit (from the past 240 hour(s)).  Radiology Reports Dg Chest 2 View  Result Date: 03/13/2017 CLINICAL DATA:  Shortness of breath, improved.  Ex-smoker. EXAM: CHEST  2 VIEW COMPARISON:  03/12/2017. FINDINGS: Increased airspace opacity throughout the left lung and in the right lower lung zone. Mildly progressive prominence of the interstitial markings. Moderately large hiatal hernia. Mediastinal  surgical clips. The airspace opacity on the left is obscuring the heart borders. Right diaphragmatic eventration. Diffuse osteopenia. IMPRESSION: 1. Progressive changes of congestive heart failure. 2.  Moderately large hiatal hernia. Electronically Signed   By: Claudie Revering M.D.   On: 03/13/2017 10:51   Dg Chest 2 View  Result Date: 03/12/2017 CLINICAL DATA:  Low O2 sats. Prior left upper lobectomy. Shortness of breath. EXAM: CHEST  2 VIEW COMPARISON:  02/24/2017 FINDINGS: Postoperative changes in the left upper lung. Diffuse airspace disease and interstitial prominence throughout both lungs. This could represent edema or infection. Mild cardiomegaly. Large hiatal hernia. No effusions. IMPRESSION: Diffuse interstitial and alveolar opacities throughout the lungs which could reflect edema or infection. Large hiatal hernia. Electronically Signed   By: Rolm Baptise M.D.   On: 03/12/2017 12:27   Dg Chest 2 View  Result Date: 02/24/2017 CLINICAL DATA:  Status post left upper lobectomy on February 01, 2017 for squamous cell malignancy. No current chest complaints. History of asthma and atrial fibrillation, coronary artery disease. No current complaints. EXAM: CHEST  2 VIEW COMPARISON:  Chest x-ray of February 15, 2017 and CT scan of the chest of the same day. FINDINGS: The right lung is adequately inflated. Prominent elevation of a portion of the right hemidiaphragm is stable. On the left there are post upper lobectomy changes. There is no alveolar infiltrate. There is no significant pleural effusion and no pneumothorax. The heart is normal in size. There is calcification in the wall of the aortic arch. The pulmonary vascularity is nearly normal. There is a moderate-sized hiatal hernia. IMPRESSION: Further interval improvement in the appearance of the chest with improved aeration and decreased pulmonary interstitial edema. Persistent large hiatal hernia. Thoracic aortic atherosclerosis. Electronically Signed   By:  David  Martinique M.D.   On: 02/24/2017 15:47   Ct Angio Chest Pe W Or Wo Contrast  Result Date: 02/15/2017 CLINICAL DATA:  Lung cancer, CHF, possible health care associated pneumonia, LEFT lower extremity swelling acute onset of shortness of breath/dyspnea question pulmonary embolism ; history hypertension, coronary artery disease post PTCA, asthma, GERD, former smoker, atrial fibrillation EXAM: CT ANGIOGRAPHY CHEST WITH CONTRAST TECHNIQUE: Multidetector CT imaging of the chest was performed using the standard protocol during bolus administration of intravenous contrast. Multiplanar CT image reconstructions and MIPs were obtained to evaluate the vascular anatomy. CONTRAST:  100 cc Isovue 370 IV COMPARISON:  PET-CT 01/04/2017 FINDINGS: Cardiovascular: Atherosclerotic calcifications aorta and minimally in coronary arteries. Aneurysmal dilatation ascending thoracic aorta 4.3 cm transverse image 45. Scattered beam hardening artifacts from dense contrast in the SVC. Pulmonary arteries well opacified and patent. No evidence of pulmonary embolism. No pericardial effusion. Enlargement of cardiac chambers. Mediastinum/Nodes: Large hiatal hernia. Base of cervical region normal appearance. No thoracic adenopathy. Lungs/Pleura: Post LEFT upper lobectomy LEFT pleural thickening/fluid in the LEFT hemithorax. Partial atelectasis of LEFT lower lobe. Peripheral atelectasis and a few subpleural blebs in RIGHT lung. No definite infiltrate or pneumothorax. Significant elevation of RIGHT diaphragm. Upper Abdomen: Unremarkable Musculoskeletal: Diffuse osseous demineralization. Review of the MIP images confirms the above findings. IMPRESSION: No evidence of pulmonary embolism. Scattered atelectasis RIGHT lung with postsurgical changes in LEFT lung post LEFT upper lobectomy. Hiatal hernia. Aortic atherosclerosis and coronary arterial calcification with aneurysmal dilatation of the ascending thoracic aorta, 4.3 cm transverse,  recommendation below. Recommend annual imaging followup by CTA or MRA. This recommendation follows 2010 ACCF/AHA/AATS/ACR/ASA/SCA/SCAI/SIR/STS/SVM Guidelines for the Diagnosis and Management of Patients with Thoracic Aortic Disease. Circulation. 2010; 121: U542-H062 Electronically Signed   By: Lavonia Dana M.D.   On: 02/15/2017 11:37   Dg Chest Port 1 View  Result Date: 02/15/2017 CLINICAL DATA:  71 year old male with fever and shortness of breath. EXAM: PORTABLE CHEST 1 VIEW COMPARISON:  Chest radiograph dated 02/09/2017 FINDINGS: There is low lung volumes. There progression of airspace density involving the left mid to lower lung fields concerning for developing infiltrate. Right mid and lower lung field interstitial prominence and streaky densities noted. There is no significant pleural effusion. No pneumothorax. There is silhouetting of the cardiac borders. Multiple surgical clips noted in the left hilar and suprahilar region. There is minimally displaced fracture of the lateral left fifth rib which appears new compared to prior study. Correlation with point tenderness recommended. IMPRESSION: 1. Increased left mid for lower lung field airspace opacity concerning for developing pneumonia versus pulmonary contusion. Right mid to lower lung field interstitial prominence and streaky densities may represent atelectasis versus infiltrate. 2. Minimally displaced fracture of the lateral aspect of the left fifth rib, new from prior study. No pneumothorax. Electronically Signed   By: Anner Crete M.D.   On: 02/15/2017 04:10    Lab Data:  CBC:  Recent Labs Lab 03/09/17 1207 03/12/17 1149 03/13/17 0503  WBC 9.4 9.7 6.9  NEUTROABS 6.6* 6.6  --   HGB 11.1* 9.9* 10.1*  HCT 34.1* 31.2* 31.3*  MCV 89.5 89.9 89.2  PLT 228 321 937   Basic Metabolic Panel:  Recent Labs Lab 03/09/17 1207 03/12/17 1149 03/13/17 0503  NA 138 136 137  K 3.9 3.4* 3.4*  CL  --  99* 93*  CO2 29 25 35*  GLUCOSE 111 131*  112*  BUN 12.'5 12 14  '$ CREATININE 1.0 1.03 1.05  CALCIUM 9.6 8.6* 8.7*  MG  --  1.5* 2.2   GFR: Estimated Creatinine Clearance: 66.9 mL/min (by C-G formula based on SCr of 1.05 mg/dL). Liver Function Tests:  Recent Labs Lab 03/09/17 1207  AST 15  ALT 13  ALKPHOS 90  BILITOT 0.86  PROT 7.3  ALBUMIN 2.9*   No results for input(s): LIPASE, AMYLASE in the last 168 hours. No results for input(s): AMMONIA in the last 168 hours. Coagulation Profile: No results for input(s): INR, PROTIME in the last 168 hours. Cardiac Enzymes: No results for input(s): CKTOTAL, CKMB, CKMBINDEX, TROPONINI in the last 168 hours. BNP (last 3 results) No results for input(s): PROBNP in the last 8760 hours. HbA1C: No results for input(s): HGBA1C in the last 72 hours. CBG: No results for input(s): GLUCAP in the last 168 hours. Lipid Profile: No results for input(s): CHOL, HDL, LDLCALC, TRIG, CHOLHDL, LDLDIRECT in the last 72 hours. Thyroid Function Tests: No results for input(s): TSH, T4TOTAL, FREET4, T3FREE, THYROIDAB in the last 72 hours. Anemia Panel: No results for input(s): VITAMINB12, FOLATE, FERRITIN, TIBC, IRON, RETICCTPCT in the last 72 hours. Urine analysis:    Component Value Date/Time   COLORURINE AMBER (A) 02/15/2017 0344   APPEARANCEUR HAZY (A) 02/15/2017 0344   LABSPEC 1.016 02/15/2017 0344   PHURINE 5.0 02/15/2017 0344   GLUCOSEU NEGATIVE 02/15/2017 0344   HGBUR MODERATE (A) 02/15/2017 0344   BILIRUBINUR NEGATIVE 02/15/2017 0344   KETONESUR NEGATIVE 02/15/2017 0344   PROTEINUR 30 (A) 02/15/2017 0344   NITRITE POSITIVE (A) 02/15/2017 0344   LEUKOCYTESUR SMALL (A) 02/15/2017 0344     Mattilynn Forrer M.D. [facility] 03/13/2017, 11:10 AM  Pager: [phone] Between 7am to 7pm - call Pager - [phone]  After 7pm go to [email or web address] - password [email or web address]  Call night coverage person covering after 7pm

## 2017-03-13 NOTE — Evaluation (Addendum)
Physical Therapy Evaluation Patient Details Name: Paul Valdez MRN: [id number] DOB: 1946/12/18 Today's Date: 03/13/2017   History of Present Illness  Patient is a 71 yo male admitted 03/12/17 with SOB, resp failure with hypoxia, anemia.     PMH:  Lung CA s/p LUL lobectomy 01/2017, HTN, HLD, CAD, Afib, CHF, home O2 at 3L, arthritis, sleep apnea.  Clinical Impression  Patient presents with problems listed below.  Will benefit from acute PT to maximize functional mobility prior to discharge home.  Patient with general weakness impacting functional mobility. Patient was receiving HHPT pta - recommend patient continue this therapy.  Instructed patient to use his RW at all times when ambulating.    Follow Up Recommendations Home health PT;Supervision - Intermittent    Equipment Recommendations  None recommended by PT    Recommendations for Other Services       Precautions / Restrictions Precautions Precautions: Fall Precaution Comments: On O2 at 4L Restrictions Weight Bearing Restrictions: No      Mobility  Bed Mobility Overal bed mobility: Modified Independent             General bed mobility comments: Increased time and use of bed rail.  Transfers Overall transfer level: Needs assistance Equipment used: Rolling walker (2 wheeled);None Transfers: Sit to/from [employer] to Stand: Min guard Stand pivot transfers: Min assist       General transfer comment: Verbal cues for hand placement.  Assist for safety/balance.  Patient transferred bed > chair with min assist for balance and safety, with no assistive device.    Ambulation/Gait Ambulation/Gait assistance: Min guard Ambulation Distance (Feet): 160 Feet Assistive device: Rolling walker (2 wheeled) Gait Pattern/deviations: Step-through pattern;Decreased stride length;Drifts right/left;Trunk flexed Gait velocity: Decreased Gait velocity interpretation: Below normal speed for age/gender General Gait  Details: Verbal cues to stand upright and stay closer to RW.  Patient drifts to Lt and Rt while inside RW.  No loss of balance during gait.  O2 at 4L during gait. Required 2 standing rest breaks.  Patient's HR increased with activity as high as 153.  RN present and checked on patient.  Pt reports no chest pain or SOB.  Once sitting at EOB, HR began decreasing.  Stairs            Wheelchair Mobility    Modified Rankin (Stroke Patients Only)       Balance Overall balance assessment: Needs assistance         Standing balance support: Bilateral upper extremity supported Standing balance-Leahy Scale: Poor                               Pertinent Vitals/Pain Pain Assessment: No/denies pain    Home Living Family/patient expects to be discharged to:: Private residence Living Arrangements: Alone Available Help at Discharge: Friend(s);Available PRN/intermittently Type of Home: House Home Access: Level entry     Home Layout: One level Home Equipment: Cane - single point;Grab bars - tub/shower;Walker - 2 wheels Additional Comments: Patient reports neighbors/friends help him with transportation.    Prior Function Level of Independence: Independent with assistive device(s)         Comments: Patient uses cane in house occasionally and always outside.     Hand Dominance   Dominant Hand: Right    Extremity/Trunk Assessment   Upper Extremity Assessment Upper Extremity Assessment: Generalized weakness    Lower Extremity Assessment Lower Extremity Assessment: Generalized weakness  Communication   Communication: HOH (Hearing aid)  Cognition Arousal/Alertness: Awake/alert Behavior During Therapy: WFL for tasks assessed/performed Overall Cognitive Status: Within Functional Limits for tasks assessed  Patient is very talkative.                      General Comments      Exercises     Assessment/Plan    PT Assessment Patient needs  continued PT services  PT Problem List Decreased strength;Decreased balance;Decreased mobility;Decreased knowledge of use of DME;Cardiopulmonary status limiting activity       PT Treatment Interventions DME instruction;Gait training;Functional mobility training;Therapeutic activities;Therapeutic exercise;Patient/family education    PT Goals (Current goals can be found in the Care Plan section)  Acute Rehab PT Goals Patient Stated Goal: To get stronger and return home. PT Goal Formulation: With patient Time For Goal Achievement: 03/20/17 Potential to Achieve Goals: Good    Frequency Min 3X/week   Barriers to discharge Decreased caregiver support Patient lives alone.    Co-evaluation               End of Session Equipment Utilized During Treatment: Gait belt;Oxygen Activity Tolerance: Patient tolerated treatment well Patient left: in chair;with call bell/phone within reach;with nursing/sitter in room Nurse Communication: Mobility status (HR increase with activity) PT Visit Diagnosis: Unsteadiness on feet (R26.81);Muscle weakness (generalized) (M62.81);Difficulty in walking, not elsewhere classified (R26.2)    Functional Assessment Tool Used: Clinical judgement;AM-PAC 6 Clicks Basic Mobility Functional Limitation: Mobility: Walking and moving around Mobility: Walking and Moving Around Current Status (Z0017): At least 20 percent but less than 40 percent impaired, limited or restricted Mobility: Walking and Moving Around Goal Status [phone]): At least 1 percent but less than 20 percent impaired, limited or restricted    Time: 1824-1909 PT Time Calculation (min) (ACUTE ONLY): 45 min - 10 min for conversation during session = 35 min total   Charges:   PT Evaluation $PT Eval Moderate Complexity: 1 Procedure PT Treatments $Gait Training: 8-22 mins   PT G Codes:   PT G-Codes **NOT FOR INPATIENT CLASS** Functional Assessment Tool Used: Clinical judgement;AM-PAC 6 Clicks Basic  Mobility Functional Limitation: Mobility: Walking and moving around Mobility: Walking and Moving Around Current Status (Q7591): At least 20 percent but less than 40 percent impaired, limited or restricted Mobility: Walking and Moving Around Goal Status [phone]): At least 1 percent but less than 20 percent impaired, limited or restricted     Despina Pole 03/13/2017, 7:44 PM Carita Pian. Sanjuana Kava, [place] Pager [phone]

## 2017-03-13 NOTE — Progress Notes (Signed)
Patient's fall risk is 14. He declined bed alarm and stated he will call for assistance before getting out of bed.

## 2017-03-13 NOTE — Progress Notes (Signed)
Patient slept during the night, without any problems or issues. HR remained around 90-100 bpm. Will continue to monitor.  Timera Windt, RN

## 2017-03-13 NOTE — Plan of Care (Signed)
Problem: Tissue Perfusion: Goal: Risk factors for ineffective tissue perfusion will decrease Outcome: Progressing Diuresing well   Problem: Activity: Goal: Risk for activity intolerance will decrease Outcome: Progressing PT Eval and Treat ordered today

## 2017-03-14 ENCOUNTER — Encounter (HOSPITAL_COMMUNITY): Payer: Self-pay | Admitting: *Deleted

## 2017-03-14 ENCOUNTER — Inpatient Hospital Stay (HOSPITAL_COMMUNITY): Payer: Medicare Other

## 2017-03-14 DIAGNOSIS — J9600 Acute respiratory failure, unspecified whether with hypoxia or hypercapnia: Secondary | ICD-10-CM

## 2017-03-14 DIAGNOSIS — I5033 Acute on chronic diastolic (congestive) heart failure: Secondary | ICD-10-CM

## 2017-03-14 DIAGNOSIS — I481 Persistent atrial fibrillation: Secondary | ICD-10-CM

## 2017-03-14 DIAGNOSIS — E876 Hypokalemia: Secondary | ICD-10-CM

## 2017-03-14 LAB — BASIC METABOLIC PANEL
ANION GAP: 11 (ref 5–15)
BUN: 16 mg/dL (ref 6–20)
CHLORIDE: 87 mmol/L — AB (ref 101–111)
CO2: 35 mmol/L — ABNORMAL HIGH (ref 22–32)
CREATININE: 1.02 mg/dL (ref 0.61–1.24)
Calcium: 8.8 mg/dL — ABNORMAL LOW (ref 8.9–10.3)
GFR calc non Af Amer: >60 mL/min (ref >60)
Glucose, Bld: 110 mg/dL — ABNORMAL HIGH (ref 65–99)
Potassium: 2.8 mmol/L — ABNORMAL LOW (ref 3.5–5.1)
SODIUM: 133 mmol/L — AB (ref 135–145)

## 2017-03-14 LAB — D-DIMER, QUANTITATIVE (NOT AT ARMC): D DIMER QUANT: 3.48 ug{FEU}/mL — AB (ref 0.00–0.50)

## 2017-03-14 LAB — MRSA PCR SCREENING: MRSA BY PCR: NEGATIVE

## 2017-03-14 LAB — MAGNESIUM: Magnesium: 1.8 mg/dL (ref 1.7–2.4)

## 2017-03-14 LAB — POTASSIUM: Potassium: 3.3 mmol/L — ABNORMAL LOW (ref 3.5–5.1)

## 2017-03-14 LAB — TROPONIN I: Troponin I: 0.04 ng/mL (ref <0.03)

## 2017-03-14 MED ORDER — IOPAMIDOL (ISOVUE-370) INJECTION 76%
INTRAVENOUS | Status: AC
  Administered 2017-03-14: 100 mL
  Filled 2017-03-14: qty 100

## 2017-03-14 MED ORDER — DILTIAZEM HCL 100 MG IV SOLR
5.0000 mg/h | INTRAVENOUS | Status: DC
  Administered 2017-03-14 – 2017-03-16 (×3): 5 mg/h via INTRAVENOUS
  Filled 2017-03-14 (×4): qty 100

## 2017-03-14 MED ORDER — POTASSIUM CHLORIDE CRYS ER 20 MEQ PO TBCR
40.0000 meq | EXTENDED_RELEASE_TABLET | Freq: Once | ORAL | Status: AC
  Administered 2017-03-14: 40 meq via ORAL
  Filled 2017-03-14: qty 2

## 2017-03-14 MED ORDER — POTASSIUM CHLORIDE CRYS ER 20 MEQ PO TBCR
40.0000 meq | EXTENDED_RELEASE_TABLET | Freq: Every day | ORAL | Status: DC
  Administered 2017-03-14: 40 meq via ORAL
  Filled 2017-03-14: qty 2

## 2017-03-14 MED ORDER — DILTIAZEM HCL 25 MG/5ML IV SOLN
10.0000 mg | Freq: Once | INTRAVENOUS | Status: AC
  Administered 2017-03-14: 10 mg via INTRAVENOUS
  Filled 2017-03-14: qty 5

## 2017-03-14 MED ORDER — GUAIFENESIN-DM 100-10 MG/5ML PO SYRP
5.0000 mL | ORAL_SOLUTION | ORAL | Status: DC | PRN
  Administered 2017-03-14 – 2017-03-15 (×2): 5 mL via ORAL
  Filled 2017-03-14 (×2): qty 5

## 2017-03-14 MED ORDER — VANCOMYCIN HCL 10 G IV SOLR
1750.0000 mg | Freq: Once | INTRAVENOUS | Status: AC
  Administered 2017-03-14: 1750 mg via INTRAVENOUS
  Filled 2017-03-14: qty 1750

## 2017-03-14 MED ORDER — SODIUM CHLORIDE 0.9 % IV SOLN
30.0000 meq | Freq: Once | INTRAVENOUS | Status: DC
  Filled 2017-03-14: qty 15

## 2017-03-14 MED ORDER — POTASSIUM CHLORIDE CRYS ER 20 MEQ PO TBCR
40.0000 meq | EXTENDED_RELEASE_TABLET | Freq: Two times a day (BID) | ORAL | Status: DC
  Administered 2017-03-14 – 2017-03-18 (×8): 40 meq via ORAL
  Filled 2017-03-14 (×8): qty 2

## 2017-03-14 MED ORDER — VANCOMYCIN HCL IN DEXTROSE 1-5 GM/200ML-% IV SOLN
1000.0000 mg | Freq: Two times a day (BID) | INTRAVENOUS | Status: DC
  Administered 2017-03-14 – 2017-03-16 (×5): 1000 mg via INTRAVENOUS
  Filled 2017-03-14 (×6): qty 200

## 2017-03-14 MED ORDER — DEXTROSE 5 % IV SOLN
1.0000 g | Freq: Three times a day (TID) | INTRAVENOUS | Status: DC
  Administered 2017-03-14 – 2017-03-17 (×9): 1 g via INTRAVENOUS
  Filled 2017-03-14 (×11): qty 1

## 2017-03-14 NOTE — Progress Notes (Signed)
Patient HR 127-140's. Patient asymptomatic. MD notified.

## 2017-03-14 NOTE — Progress Notes (Signed)
EKF shows Afib with RVR, MD made aware.  Diltiazem IV 10 mg given. Patient resting quietly at this time

## 2017-03-14 NOTE — Progress Notes (Signed)
Refused bed alarm.  

## 2017-03-14 NOTE — Progress Notes (Signed)
Dr. Tana Coast at bedside.Vital sings taken and recorded.  New orders noted.

## 2017-03-14 NOTE — Progress Notes (Signed)
Patient alert and oriented. Oxigen saturation is fluctuating from 89-91% on 6 l/min. Call respiratory for consult. Will continue to monitor.  Pegi Milazzo, RN

## 2017-03-14 NOTE — Consult Note (Signed)
CARDIOLOGY CONSULT NOTE   Patient ID: Paul Valdez MRN: [id number] DOB/AGE: 1946/06/30 71 y.o.  Admit date: 03/12/2017  Primary Physician   Gara Kroner, MD Primary Cardiologist   Dr Irish Lack Reason for Consultation   CHF & A Fib Requesting MD: Dr Tana Coast  JHE:[id number] Paul Valdez is a 71 y.o. year old male with a history of HTN, COPD on home O2 at 3 lpm, asthma, prior tob use, recent dx lung CA. Nl MV 2014, nl Echo 2015.  01/22/2017 Seen by cards preop>>no testing needed. 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   Post-op afib seen>>Elqiuis and Cardizem added, metop d/c'd. Chadsvasc=2 (age x 1, HTN). 02/15/2017 admit for PNA, Echo w/ nl EF, ?diast dysf, mod LA dilatation. 03/01/2017 office visit, confusion over meds, Dilt increased, K+ added, 1 mo f/u, volume ok, wt 196  Admit 03/16 with CHF, wt 194, diuresed to 182. D/c planned. However, when pt evaluated 03/18, he was having increased SOB, and was in rapid afib, cards asked to see.    He was initially diuresed and feeling better. However,yesterday pm, he walked with PT. The walk did not go well. He got very SOB. The SOB did not improve. That is the time he felt his heart start jumping around, feels that is when he went into afib. The PT eval was at 7 pm, His heart rate has been variable since then, is fairly rapid.  He gradually got worse overnight, his O2 is up to 6 lpm.  He is concerned that he will not be able to get stronger. He cannot exert himself 2nd SOB, feels he is getting weaker.   He wants to know what kind of recovery he can expect and how long that will take.   Past Medical History:  Diagnosis Date  . Acute respiratory failure ([place])   . Arthritis   . Asthma   . Atrial fibrillation ([place])   . Atypical chest pain    a. Normal nuc 2014.  . Carotid artery disease ([place])    a. Carotid duplex 2015: 48-18% RICA, 5-63% LICA.   Marland Kitchen Cataract   . CHF (congestive heart failure)  ([place])   . Coronary artery calcification seen on CT scan   . Depression   . Detached retina   . Former consumption of alcohol   . Former tobacco use   . GERD (gastroesophageal reflux disease)    "I take heart burn medicine"  . Glaucoma   . Hemorrhoids 03/09/2017  . Hyperlipemia   . Hypertension   . Low back pain 12/29/2016  . Mass of upper lobe of left lung 12/29/2016  . PVD (peripheral vascular disease) ([place])    a. Mild plaque of iliacs in 2013 on duplex; PET 2018:  PET also corroborated coronary, aortic arch, and branch vessel atherosclerotic vascular disease as well as aortoiliac atherosclerotic vascular disease.  . Sleep apnea    has not gotten CPAP yet     Past Surgical History:  Procedure Laterality Date  . APPENDECTOMY    . COLONOSCOPY    . EYE SURGERY    . FLEXIBLE BRONCHOSCOPY N/A 02/01/2017   Procedure: FLEXIBLE BRONCHOSCOPY;  Surgeon: Gaye Pollack, MD;  Location: [facility] OR;  Service: Thoracic;  Laterality: N/A;  . GANGLION CYST EXCISION     left hand  . HERNIA REPAIR     double hernia repair  . THORACOTOMY/LOBECTOMY Left 02/01/2017   Procedure: THORACOTOMY/LEFT UPPER LOBECTOMY;  Surgeon: Gaye Pollack, MD;  Location: [facility] OR;  Service: Thoracic;  Laterality: Left;  . TUMOR REMOVAL     non cancer tumor from neck    Allergies  Allergen Reactions  . Colchicine Diarrhea    I have reviewed the patient's current medications . apixaban  5 mg Oral BID  . atorvastatin  40 mg Oral QPM  . diltiazem  180 mg Oral Daily  . furosemide  80 mg Intravenous BID  . gabapentin  600 mg Oral TID  . hydrocortisone   Rectal TID  . LORazepam  0.5 mg Oral QHS  . mouth rinse  15 mL Mouth Rinse BID  . pantoprazole  40 mg Oral Daily  . potassium chloride (KCL MULTIRUN) 30 mEq in 265 mL IVPB  30 mEq Intravenous Once  . potassium chloride  40 mEq Oral Daily  . sertraline  100 mg Oral Daily  . sodium chloride flush  3 mL Intravenous Q12H  . tamsulosin  0.4 mg Oral QPC supper  . [START ON  03/02/2017] Vitamin D (Ergocalciferol)  50,000 Units Oral Q7 days    sodium chloride, acetaminophen, hydrALAZINE, ondansetron (ZOFRAN) IV, sodium chloride flush  Prior to Admission medications   Medication Sig Start Date End Date Taking? Authorizing Provider  apixaban (ELIQUIS) 5 MG TABS tablet Take 1 tablet (5 mg total) by mouth 2 (two) times daily. 03/01/17  Yes Imogene Burn, PA-C  atorvastatin (LIPITOR) 40 MG tablet Take 1 tablet (40 mg total) by mouth every evening. 03/01/17  Yes Imogene Burn, PA-C  cyanocobalamin (,VITAMIN B-12,) 1000 MCG/ML injection Inject 1 mL (1,000 mcg total) into the skin every 30 (thirty) days. Please go to your [facility] MD's office for this injection 02/17/17  Yes Shanker Kristeen Mans, MD  diltiazem (CARDIZEM CD) 180 MG 24 hr capsule Take 1 capsule (180 mg total) by mouth daily. 03/01/17  Yes Imogene Burn, PA-C  furosemide (LASIX) 40 MG tablet Take 1 tablet (40 mg total) by mouth daily. 03/01/17  Yes Imogene Burn, PA-C  gabapentin (NEURONTIN) 300 MG capsule Take 2 capsules (600 mg total) by mouth 3 (three) times daily. 02/17/17  Yes Shanker Kristeen Mans, MD  hydrocortisone (ANUSOL-HC) 2.5 % rectal cream Place rectally 3 (three) times daily. For 5 days only. 02/17/17  Yes Shanker Kristeen Mans, MD  omeprazole (PRILOSEC) 20 MG capsule Take 1 capsule (20 mg total) by mouth every evening. 02/17/17  Yes Shanker Kristeen Mans, MD  Potassium Chloride ER 20 MEQ TBCR Take 20 mEq by mouth daily. 03/01/17  Yes Imogene Burn, PA-C  sertraline (ZOLOFT) 100 MG tablet Take 1 tablet (100 mg total) by mouth daily. 02/17/17  Yes Shanker Kristeen Mans, MD  tamsulosin (FLOMAX) 0.4 MG CAPS capsule Take 1 capsule (0.4 mg total) by mouth daily after supper. 02/17/17  Yes Shanker Kristeen Mans, MD  Vitamin D, Ergocalciferol, (DRISDOL) 50000 units CAPS capsule Take 1 capsule (50,000 Units total) by mouth every 7 (seven) days. Every Friday 02/17/17  Yes Shanker Kristeen Mans, MD     Social History   Social History  .  Marital status: Single    Spouse name: N/A  . Number of children: N/A  . Years of education: N/A   Occupational History  . Not on file.   Social History Main Topics  . Smoking status: Former Research scientist (life sciences)  . Smokeless tobacco: Never Used     Comment: QUIT SMOKING IN THE LATE 90'S  . Alcohol use No  . Drug  use: No  . Sexual activity: Not on file   Other Topics Concern  . Not on file   Social History Narrative  . No narrative on file    Family Status  Relation Status  . Father Deceased  . Mother Deceased  . Sister Deceased  . Maternal Grandmother Deceased  . Maternal Grandfather Deceased  . Paternal Grandmother Deceased  . Paternal Grandfather Deceased  . Sister Alive  . Brother    Family History  Problem Relation Age of Onset  . Colon cancer Father   . Hypertension Father   . Hypertension Mother   . Colon cancer Sister   . Colon cancer Brother      ROS:  Full 14 point review of systems complete and found to be negative unless listed above.  Physical Exam: Blood pressure 132/87, pulse (!) 112, temperature 97.9 F (36.6 C), temperature source Oral, resp. rate (!) 28, height _0  (1.702 m), weight 182 lb 8 oz (82.8 kg), SpO2 93 %.  General: Well developed, well nourished, male in no acute distress Head: Eyes PERRLA, No xanthomas.   Normocephalic and atraumatic, oropharynx without edema or exudate. Dentition: Poor Lungs:  Heart: HRRR S1 S2, no rub/gallop, Heart irregular rate and rhythm with S1, S2  murmur. pulses are 2+ all 4 extrem.   Neck: No carotid bruits. No lymphadenopathy.  JVP 12+ cm Abdomen: Bowel sounds present, abdomen soft and non-tender without masses or hernias noted. Msk:  No spine or cva tenderness. No weakness, no joint deformities or effusions. Extremities: No clubbing or cyanosis. No edema.  Neuro: Alert and oriented X 3. No focal deficits noted. Psych:  Good affect, responds appropriately Skin: No rashes or lesions noted.  Labs:   Lab Results    Component Value Date   WBC 6.9 03/13/2017   HGB 10.1 (L) 03/13/2017   HCT 31.3 (L) 03/13/2017   MCV 89.2 03/13/2017   PLT 300 03/13/2017   No results for input(s): INR in the last 72 hours.  Recent Labs Lab 03/09/17 1207  03/14/17 0510  NA 138  < > 133*  K 3.9  < > 2.8*  CL  --   < > 87*  CO2 29  < > 35*  BUN 12.5  < > 16  CREATININE 1.0  < > 1.02  CALCIUM 9.6  < > 8.8*  PROT 7.3  --   --   BILITOT 0.86  --   --   ALKPHOS 90  --   --   ALT 13  --   --   AST 15  --   --   GLUCOSE 111  < > 110*  ALBUMIN 2.9*  --   --   < > = values in this interval not displayed. Magnesium  Date Value Ref Range Status  03/13/2017 2.2 1.7 - 2.4 mg/dL Final   No results for input(s): CKTOTAL, CKMB, TROPONINI in the last 72 hours.  Recent Labs  03/12/17 1345  TROPIPOC 0.01   B Natriuretic Peptide  Date/Time Value Ref Range Status  03/12/2017 01:17 PM 609.6 (H) 0.0 - 100.0 pg/mL Final  02/15/2017 03:45 AM 984.2 (H) 0.0 - 100.0 pg/mL Final   TSH  Date/Time Value Ref Range Status  02/09/2017 12:16 PM 0.959 0.350 - 4.500 uIU/mL Final    Comment:    Performed by a 3rd Generation assay with a functional sensitivity of <=0.01 uIU/mL.    Echo:  02/16/17 Study Conclusions - Left ventricle: The cavity size  was normal. Wall thickness was increased in a pattern of mild LVH. Systolic function was normal. The estimated ejection fraction was in the range of 60% to 65%. Indeterminant diastolic function. Wall motion was normal; there were no regional wall motion abnormalities. - Aortic valve: There was no stenosis. - Mitral valve: There was trivial regurgitation. - Left atrium: The atrium was moderately dilated. - Right ventricle: The cavity size was normal. Systolic function was normal. - Right atrium: The atrium was mildly dilated. - Tricuspid valve: Peak RV-RA gradient (S): 30 mm Hg. - Pulmonary arteries: PA peak pressure: 33 mm Hg (S). - Inferior vena cava: The vessel was  normal in size. The respirophasic diameter changes were in the normal range (>= 50%), consistent with normal central venous pressure. - Pericardium, extracardiac: A trivial pericardial effusion was identified posterior to the heart. Impressions: - Normal LV size with mild LV hypertrophy. EF 60-65%. Normal RV size and systolic function. Biatrial enlargement. No significant valvular abnormalities.   ECG:  03/18 Coarse atrial fib with rapid ventricular response   Cath:   Radiology:  Dg Chest 2 View Result Date: 03/13/2017 CLINICAL DATA:  Shortness of breath, improved.  Ex-smoker. EXAM: CHEST  2 VIEW COMPARISON:  03/12/2017. FINDINGS: Increased airspace opacity throughout the left lung and in the right lower lung zone. Mildly progressive prominence of the interstitial markings. Moderately large hiatal hernia. Mediastinal surgical clips. The airspace opacity on the left is obscuring the heart borders. Right diaphragmatic eventration. Diffuse osteopenia. IMPRESSION: 1. Progressive changes of congestive heart failure. 2. Moderately large hiatal hernia. Electronically Signed   By: Claudie Revering M.D.   On: 03/13/2017 10:51   Dg Chest 2 View Result Date: 03/12/2017 CLINICAL DATA:  Low O2 sats. Prior left upper lobectomy. Shortness of breath. EXAM: CHEST  2 VIEW COMPARISON:  02/24/2017 FINDINGS: Postoperative changes in the left upper lung. Diffuse airspace disease and interstitial prominence throughout both lungs. This could represent edema or infection. Mild cardiomegaly. Large hiatal hernia. No effusions. IMPRESSION: Diffuse interstitial and alveolar opacities throughout the lungs which could reflect edema or infection. Large hiatal hernia. Electronically Signed   By: Rolm Baptise M.D.   On: 03/12/2017 12:27   Dg Chest Port 1v Same Day Result Date: 03/14/2017 CLINICAL DATA:  Dyspnea.  Previous left lobectomy for lung cancer. EXAM: PORTABLE CHEST 1 VIEW COMPARISON:  03/13/2017 and  03/12/2017 as well as 02/15/2017 FINDINGS: Lungs are somewhat hypoinflated and demonstrate postsurgical changes of the left lung/ hemithorax compatible previous lobectomy for lung cancer. There is mild overall worsening a path hazy opacification over the left lung as well as slight worsening mixed interstitial airspace density over the right upper lobe as cannot exclude developing infection. No definite effusion. Remainder the exam is unchanged. IMPRESSION: Slight interval worsening opacification over the left lung on background of postsurgical change as well as mild worsen mixed interstitial airspace density over the right upper lobe as cannot exclude developing infection. Electronically Signed   By: Marin Olp M.D.   On: 03/14/2017 08:38    ASSESSMENT AND PLAN:   The patient was seen today by Dr. Aundra Dubin, the patient evaluated and the data reviewed.  Principal Problem: 1.  Acute respiratory failure ([place]) - He has not yet fully recovered from lung surgery on 02/01/2017. - He was in heart failure on admission, his chest x-rays show progression of this over his stay.  - The chest x-ray reports also mention that they cannot exclude developing infection. - Continue diuresis, management of  other issues per IM - Consider antibiotics, not sure if this would be CAP or HCAP  2.  Acute on chronic diastolic congestive heart failure ([facility]) - Dry weight is unclear. He obviously needs to be diuresed more. - Will review chest x-ray with M.D. to see if decubitus film would help. - Will recheck BNP  3. Persistent atrial fibrillation with rapid ventricular response ([place]) - Patient has not had palpitations despite being in atrial fibrillation. - His last ECG that was sinus rhythm was 01/22/2017. All others since then have been atrial fib or atrial flutter. - His rhythm now seems to be atrial fib.  - He has been anticoagulated with Eliquis, but only since 03/01/2017 - It may be that the atrial fib/flutter is  contributing to his CHF. - We may need to consider restoring sinus rhythm. - His left atrium was moderately dilated, his right atrium was mildly dilated at his echo 02/16/2017  - M.D. advise if repeat is needed  4. Hypokalemia - Secondary to diuresis - Supplement aggressively - Discussed with M.D. if IV supplement is needed.  Otherwise, per IM Active Problems:   Carotid artery disease ([place])   Essential hypertension   Hyperlipidemia   OSA (obstructive sleep apnea)   Lung cancer ([facility])   Elevated brain natriuretic peptide (BNP) level   Anemia   Acute respiratory distress   Signed: Lenoard Aden 03/14/2017 9:17 AM Beeper [phone]  Co-Sign MD  Patient seen with PA, agree with the above note.  1. Atrial fibrillation: Persistent, with RVR.  This seems to date from just after his lung surgery (suspect triggering factor was lung surgery).  He has been in atrial fibrillation for over a month now.  Rate is still poorly controlled and he has had worsened diastolic CHF.  I think he needs to get back into NSR.  - Continue apixaban 5 mg bid.   - Needs DCCV.  He has only been on apixaban for about 10 days, so will need TEE prior to rule out thrombus.  Will arrange for tomorrow.  I discussed risks/benefits of procedure with patient and he agrees to proceed.  - rate still not well-controlled. I will hold po diltiazem and use diltiazem gtt for now.  2. Acute on chronic diastolic CHF: Echo in 7/01 with EF 60-65%, normal RV.  CHF exacerbation likely triggered by uncontrolled atrial fibrillation.  He is volume overloaded, needs further diuresis.  Seems to be diuresing well so far on Lasix 80 mg IV bid.  - Continue Lasix 80 mg po bid, replace K.  3. PNA: He is on vancomycin/cefepime per primary team for HCAP.  4. COPD: On 3 L home oxygen.  5. h/o lung cancer: S/p recent lobectomy.   Loralie Champagne 03/14/2017 2:12 PM

## 2017-03-14 NOTE — Progress Notes (Signed)
Patient on 6L [place].Last O2 92%.  Patient alert and oriented, no adventitious lung sounds. Respiratory came to assesses patient,  Confirmed no respiratory distress. Order received to keep O2 sats greater that 88% per MD order. Will contine to monitor. Patient stated that he feels good.  Danish Ruffins, RN

## 2017-03-14 NOTE — Progress Notes (Addendum)
Patient O2 saturation dropped to 88 on 4 l/min. Increased oxgen  to 6 l /min, repositioned patient in bed and placed patient on 6 l/min nasal cannula . MD aware. Will continue to monitor.   Meaghann Choo, RN

## 2017-03-14 NOTE — Discharge Instructions (Signed)

## 2017-03-14 NOTE — Progress Notes (Signed)
Called to assess patient for decreased O2 sats.  Patient in no distress, no increased WOB, BBS clear.  Sats 88-94% on 6L [place].  Contacted [facility]ad hospitalist on call person and orders received to keep sats greater than 88%.

## 2017-03-14 NOTE — Progress Notes (Signed)
Patient still on 6 L/min , O2 saturation 93%, patient reports that he feels good, no issues with breathing  no complaints of pain. Will continue to monitor.   Javad Salva, RN

## 2017-03-14 NOTE — Progress Notes (Signed)
CRITICAL VALUE ALERT  Critical value received:  Troponin 0.04  Date of notification:  03/14/17  Time of notification:  0954  Critical value read back:Yes.    Nurse who received alert: Rafael Bihari, RN  MD notified (1st page): Rai, MD  Time of first page: 09:55 am  MD notified (2nd page):  Time of second page:  Responding MD: Rai,MD  Time MD responded: 09:56 am

## 2017-03-14 NOTE — Progress Notes (Addendum)
[facility]                                                                              Patient Demographics  Paul Valdez, is a 71 y.o. male, DOB - 13-Jun-1946, KWI:[id number]  Admit date - 03/12/2017   Admitting Physician Elwin Mocha, MD  Outpatient Primary MD for the patient is Gara Kroner, MD  Outpatient specialists:   LOS - 1  days    Chief Complaint  Patient presents with  . Shortness of Breath       Brief summary   Patient is a 71 year old male with hypertension, hyperlipidemia, CAD, COPD, A. fib, mass in his upper lobe of left lung status post lobectomy presented with shortness of breath. Patient had upper lobe lobectomy in 2/18 secondary to squamous cell carcinoma, subsequently was readmitted again with HCAP  was DC'd on 3 L of O2. He was also discharged home on Lasix 40 mg daily. Per patient he was noticed to have hypoxia with O2 sats of 65% by his home health nurse and was recommended to come to the ER. Otherwise he denied any chest pain, palpitations, coughing or any lower extremity edema.He reports compliance with his oxygen at home but at the time of admission is wondering if perhaps the machine is "malfunctioning". He reports mild intermittent nonproductive cough In the ER, O2 sats were 88% on 3 L and was given Lasix intravenously and admitted for further workup  Assessment & Plan    Principal Problem: Acute respiratory failure with hypoxia likely related to acute on chronic diastolic heart failure, recent left upper lobe lobectomy. -  Chest x-ray on admission with diffuse interstitial and alvolar opacities throughout the lungs which could reflect edema or infection. BNP was elevated 609. Temperature 99.4 no leukocytosis, no lactic acidosis. Patient was admitted and placed on IV diuresis. - This morning, O2 sats in the 80s, was overnight placed on 6 L. Stat d-dimer, troponin, chest x-ray ordered. EKG with atrial fibrillation and RVR, 120, given  Cardizem 10 mg IV 1 - wean O2 as tolerated - Chest x-ray showed slight worsening opacification over the left lung on the background of postsurgical changes as well as mild worsened mixed interstitial airspace density over the right lobe cannot exclude developing infection, placed on IV vancomycin and cefepime to cover for HCAP, obtain MRSA PCR screening  - If d-dimer elevated, will need CT angiogram chest to rule out pulmonary embolism due to history of malignancy  Active problems  Acute on chronic diastolic heart failure. Echo done in February 2018 reveals EF of 60% mild LVH. Patient was discharged on Lasix 40 mg daily  - Continue IV diuresis currently on 80 mg IV BID,  - Continue strict I's and O's and daily weights, negative balance of 4.2 L - Weight down from 194 on admission to 182 today (was 189 lbs on the day of discharge on 2/19) - Likely will need higher dose of Lasix at the time of discharge. - Cardiology consulted  Atrial fibrillation with RVR - Heart rate 120s, stat EKG showed A. fib with RVR - Continue Cardizem, will give 1 dose of  Cardizem 10 mg IV 1 -  chadvasc score 5. continue eliquis  Hypokalemia - Replaced, ordered stat magnesium as well  Hypertension. -Continue home meds  Anemia. Likely related to chronic disease. Hemoglobin 9.9 on admission. Chart review indicates this is close to his baseline.    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  - Patient following Dr. Julien Nordmann and Dr Cyndia Bent   Dyslipidemia  continue statin   Code Status: full  DVT Prophylaxis:  SCD's Family Communication: Discussed in detail with the patient, all imaging results, lab results explained to the patient and HPOA    Disposition Plan:  Time Spent in minutes   35 minutes  Procedures:    Consultants:   Cardiology  Antimicrobials:   IV vancomycin 3/18  IV cefepime 3/18   Medications  Scheduled Meds: . apixaban  5 mg Oral BID  .  atorvastatin  40 mg Oral QPM  . diltiazem  180 mg Oral Daily  . furosemide  80 mg Intravenous BID  . gabapentin  600 mg Oral TID  . hydrocortisone   Rectal TID  . LORazepam  0.5 mg Oral QHS  . mouth rinse  15 mL Mouth Rinse BID  . pantoprazole  40 mg Oral Daily  . potassium chloride (KCL MULTIRUN) 30 mEq in 265 mL IVPB  30 mEq Intravenous Once  . potassium chloride  40 mEq Oral Daily  . sertraline  100 mg Oral Daily  . sodium chloride flush  3 mL Intravenous Q12H  . tamsulosin  0.4 mg Oral QPC supper  . [START ON 03/26/2017] Vitamin D (Ergocalciferol)  50,000 Units Oral Q7 days   Continuous Infusions: PRN Meds:.sodium chloride, acetaminophen, hydrALAZINE, ondansetron (ZOFRAN) IV, sodium chloride flush   Antibiotics   Anti-infectives    None        Subjective:   [name] was seen and examined today. Heart rate elevated, somewhat more short of breath, on 6 L O2 via nasal cannula. No chest pain.  Patient denies dizziness, abdominal pain, N/V/D/C, new weakness, numbess, tingling. No acute events overnight.    Objective:   Vitals:   03/14/17 0136 03/14/17 0437 03/14/17 0815 03/14/17 0851  BP:  139/82 132/87   Pulse:  78 (!) 101 (!) 112  Resp:  18 (!) 28   Temp:  98 F (36.7 C) 97.9 F (36.6 C)   TempSrc:  Oral Oral   SpO2: 92% 93% 93% 93%  Weight:  82.8 kg (182 lb 8 oz)    Height:        Intake/Output Summary (Last 24 hours) at 03/14/17 0915 Last data filed at 03/14/17 0515  Gross per 24 hour  Intake             1020 ml  Output             2475 ml  Net            -1455 ml     Wt Readings from Last 3 Encounters:  03/14/17 82.8 kg (182 lb 8 oz)  03/09/17 88.4 kg (194 lb 12.8 oz)  03/01/17 89.3 kg (196 lb 12.8 oz)     Exam  General: Alert and oriented x 3, NAD  HEENT:     Neck: Supple, no JVD  Cardiovascular: Tachycardiac, irregularly irregular,    Respiratory: Decreased breath sounds at the bases, scattered rhonchi   Gastrointestinal: Soft,  nontender, nondistended, + bowel sounds  Ext: no cyanosis clubbing, edema  Neuro: no  new deficits  Skin: No rashes  Psych: Normal affect and demeanor, alert and oriented x3    Data Reviewed:  I have personally reviewed following labs and imaging studies  Micro Results No results found for this or any previous visit (from the past 240 hour(s)).  Radiology Reports Dg Chest 2 View  Result Date: 03/13/2017 CLINICAL DATA:  Shortness of breath, improved.  Ex-smoker. EXAM: CHEST  2 VIEW COMPARISON:  03/12/2017. FINDINGS: Increased airspace opacity throughout the left lung and in the right lower lung zone. Mildly progressive prominence of the interstitial markings. Moderately large hiatal hernia. Mediastinal surgical clips. The airspace opacity on the left is obscuring the heart borders. Right diaphragmatic eventration. Diffuse osteopenia. IMPRESSION: 1. Progressive changes of congestive heart failure. 2. Moderately large hiatal hernia. Electronically Signed   By: Claudie Revering M.D.   On: 03/13/2017 10:51   Dg Chest 2 View  Result Date: 03/12/2017 CLINICAL DATA:  Low O2 sats. Prior left upper lobectomy. Shortness of breath. EXAM: CHEST  2 VIEW COMPARISON:  02/24/2017 FINDINGS: Postoperative changes in the left upper lung. Diffuse airspace disease and interstitial prominence throughout both lungs. This could represent edema or infection. Mild cardiomegaly. Large hiatal hernia. No effusions. IMPRESSION: Diffuse interstitial and alveolar opacities throughout the lungs which could reflect edema or infection. Large hiatal hernia. Electronically Signed   By: Rolm Baptise M.D.   On: 03/12/2017 12:27   Dg Chest 2 View  Result Date: 02/24/2017 CLINICAL DATA:  Status post left upper lobectomy on February 01, 2017 for squamous cell malignancy. No current chest complaints. History of asthma and atrial fibrillation, coronary artery disease. No current complaints. EXAM: CHEST  2 VIEW COMPARISON:  Chest x-ray of  February 15, 2017 and CT scan of the chest of the same day. FINDINGS: The right lung is adequately inflated. Prominent elevation of a portion of the right hemidiaphragm is stable. On the left there are post upper lobectomy changes. There is no alveolar infiltrate. There is no significant pleural effusion and no pneumothorax. The heart is normal in size. There is calcification in the wall of the aortic arch. The pulmonary vascularity is nearly normal. There is a moderate-sized hiatal hernia. IMPRESSION: Further interval improvement in the appearance of the chest with improved aeration and decreased pulmonary interstitial edema. Persistent large hiatal hernia. Thoracic aortic atherosclerosis. Electronically Signed   By: David  Martinique M.D.   On: 02/24/2017 15:47   Ct Angio Chest Pe W Or Wo Contrast  Result Date: 02/15/2017 CLINICAL DATA:  Lung cancer, CHF, possible health care associated pneumonia, LEFT lower extremity swelling acute onset of shortness of breath/dyspnea question pulmonary embolism ; history hypertension, coronary artery disease post PTCA, asthma, GERD, former smoker, atrial fibrillation EXAM: CT ANGIOGRAPHY CHEST WITH CONTRAST TECHNIQUE: Multidetector CT imaging of the chest was performed using the standard protocol during bolus administration of intravenous contrast. Multiplanar CT image reconstructions and MIPs were obtained to evaluate the vascular anatomy. CONTRAST:  100 cc Isovue 370 IV COMPARISON:  PET-CT 01/04/2017 FINDINGS: Cardiovascular: Atherosclerotic calcifications aorta and minimally in coronary arteries. Aneurysmal dilatation ascending thoracic aorta 4.3 cm transverse image 45. Scattered beam hardening artifacts from dense contrast in the SVC. Pulmonary arteries well opacified and patent. No evidence of pulmonary embolism. No pericardial effusion. Enlargement of cardiac chambers. Mediastinum/Nodes: Large hiatal hernia. Base of cervical region normal appearance. No thoracic  adenopathy. Lungs/Pleura: Post LEFT upper lobectomy LEFT pleural thickening/fluid in the LEFT hemithorax. Partial atelectasis of LEFT lower lobe. Peripheral atelectasis and a  few subpleural blebs in RIGHT lung. No definite infiltrate or pneumothorax. Significant elevation of RIGHT diaphragm. Upper Abdomen: Unremarkable Musculoskeletal: Diffuse osseous demineralization. Review of the MIP images confirms the above findings. IMPRESSION: No evidence of pulmonary embolism. Scattered atelectasis RIGHT lung with postsurgical changes in LEFT lung post LEFT upper lobectomy. Hiatal hernia. Aortic atherosclerosis and coronary arterial calcification with aneurysmal dilatation of the ascending thoracic aorta, 4.3 cm transverse, recommendation below. Recommend annual imaging followup by CTA or MRA. This recommendation follows 2010 ACCF/AHA/AATS/ACR/ASA/SCA/SCAI/SIR/STS/SVM Guidelines for the Diagnosis and Management of Patients with Thoracic Aortic Disease. Circulation. 2010; 121: X914-N829 Electronically Signed   By: Lavonia Dana M.D.   On: 02/15/2017 11:37   Dg Chest Port 1 View  Result Date: 02/15/2017 CLINICAL DATA:  71 year old male with fever and shortness of breath. EXAM: PORTABLE CHEST 1 VIEW COMPARISON:  Chest radiograph dated 02/09/2017 FINDINGS: There is low lung volumes. There progression of airspace density involving the left mid to lower lung fields concerning for developing infiltrate. Right mid and lower lung field interstitial prominence and streaky densities noted. There is no significant pleural effusion. No pneumothorax. There is silhouetting of the cardiac borders. Multiple surgical clips noted in the left hilar and suprahilar region. There is minimally displaced fracture of the lateral left fifth rib which appears new compared to prior study. Correlation with point tenderness recommended. IMPRESSION: 1. Increased left mid for lower lung field airspace opacity concerning for developing pneumonia versus  pulmonary contusion. Right mid to lower lung field interstitial prominence and streaky densities may represent atelectasis versus infiltrate. 2. Minimally displaced fracture of the lateral aspect of the left fifth rib, new from prior study. No pneumothorax. Electronically Signed   By: Anner Crete M.D.   On: 02/15/2017 04:10   Dg Chest Port 1v Same Day  Result Date: 03/14/2017 CLINICAL DATA:  Dyspnea.  Previous left lobectomy for lung cancer. EXAM: PORTABLE CHEST 1 VIEW COMPARISON:  03/13/2017 and 03/12/2017 as well as 02/15/2017 FINDINGS: Lungs are somewhat hypoinflated and demonstrate postsurgical changes of the left lung/ hemithorax compatible previous lobectomy for lung cancer. There is mild overall worsening a path hazy opacification over the left lung as well as slight worsening mixed interstitial airspace density over the right upper lobe as cannot exclude developing infection. No definite effusion. Remainder the exam is unchanged. IMPRESSION: Slight interval worsening opacification over the left lung on background of postsurgical change as well as mild worsen mixed interstitial airspace density over the right upper lobe as cannot exclude developing infection. Electronically Signed   By: Marin Olp M.D.   On: 03/14/2017 08:38    Lab Data:  CBC:  Recent Labs Lab 03/09/17 1207 03/12/17 1149 03/13/17 0503  WBC 9.4 9.7 6.9  NEUTROABS 6.6* 6.6  --   HGB 11.1* 9.9* 10.1*  HCT 34.1* 31.2* 31.3*  MCV 89.5 89.9 89.2  PLT 228 321 562   Basic Metabolic Panel:  Recent Labs Lab 03/09/17 1207 03/12/17 1149 03/13/17 0503 03/14/17 0510  NA 138 136 137 133*  K 3.9 3.4* 3.4* 2.8*  CL  --  99* 93* 87*  CO2 29 25 35* 35*  GLUCOSE 111 131* 112* 110*  BUN 12.'5 12 14 16  '$ CREATININE 1.0 1.03 1.05 1.02  CALCIUM 9.6 8.6* 8.7* 8.8*  MG  --  1.5* 2.2  --    GFR: Estimated Creatinine Clearance: 68.4 mL/min (by C-G formula based on SCr of 1.02 mg/dL). Liver Function Tests:  Recent  Labs Lab 03/09/17 1207  AST  15  ALT 13  ALKPHOS 90  BILITOT 0.86  PROT 7.3  ALBUMIN 2.9*   No results for input(s): LIPASE, AMYLASE in the last 168 hours. No results for input(s): AMMONIA in the last 168 hours. Coagulation Profile: No results for input(s): INR, PROTIME in the last 168 hours. Cardiac Enzymes: No results for input(s): CKTOTAL, CKMB, CKMBINDEX, TROPONINI in the last 168 hours. BNP (last 3 results) No results for input(s): PROBNP in the last 8760 hours. HbA1C: No results for input(s): HGBA1C in the last 72 hours. CBG: No results for input(s): GLUCAP in the last 168 hours. Lipid Profile: No results for input(s): CHOL, HDL, LDLCALC, TRIG, CHOLHDL, LDLDIRECT in the last 72 hours. Thyroid Function Tests: No results for input(s): TSH, T4TOTAL, FREET4, T3FREE, THYROIDAB in the last 72 hours. Anemia Panel: No results for input(s): VITAMINB12, FOLATE, FERRITIN, TIBC, IRON, RETICCTPCT in the last 72 hours. Urine analysis:    Component Value Date/Time   COLORURINE AMBER (A) 02/15/2017 0344   APPEARANCEUR HAZY (A) 02/15/2017 0344   LABSPEC 1.016 02/15/2017 0344   PHURINE 5.0 02/15/2017 0344   GLUCOSEU NEGATIVE 02/15/2017 0344   HGBUR MODERATE (A) 02/15/2017 0344   BILIRUBINUR NEGATIVE 02/15/2017 0344   KETONESUR NEGATIVE 02/15/2017 0344   PROTEINUR 30 (A) 02/15/2017 0344   NITRITE POSITIVE (A) 02/15/2017 0344   LEUKOCYTESUR SMALL (A) 02/15/2017 0344     Kalla Watson M.D. [facility] 03/14/2017, 9:15 AM  Pager: [phone] Between 7am to 7pm - call Pager - [phone]  After 7pm go to [email or web address] - password [email or web address]  Call night coverage person covering after 7pm

## 2017-03-14 NOTE — Progress Notes (Signed)
Pharmacy Antibiotic Note  Paul Valdez is a 71 y.o. male admitted on 03/12/2017 with CHF exacerbation, now with possible HCAP with worsening CXR. Pharmacy has been consulted for cefepime and vancomycin dosing. WBC wnl, afebrile, CrCl ~ 68 ml/min  Plan: Vancomycin 1750 mg x1, then 1000 mg IV every 12 hours.  Goal trough 15-20 mcg/mL. Cefepime 1g IV every 8 hours Monitor renal function, clinical status, vanc trough at steady state if needed F/u length of treatment, ability to de-escalate   Height: '5\' 7"'$  (170.2 cm) Weight: 182 lb 8 oz (82.8 kg) IBW/kg (Calculated) : 66.1  Temp (24hrs), Avg:98.1 F (36.7 C), Min:97.8 F (36.6 C), Max:98.9 F (37.2 C)   Recent Labs Lab 03/09/17 1207 03/09/17 1207 03/12/17 1149 03/12/17 1410 03/13/17 0503 03/14/17 0510  WBC 9.4  --  9.7  --  6.9  --   CREATININE  --  1.0 1.03  --  1.05 1.02  LATICACIDVEN  --   --   --  1.35  --   --     Estimated Creatinine Clearance: 68.4 mL/min (by C-G formula based on SCr of 1.02 mg/dL).    Allergies  Allergen Reactions  . Colchicine Diarrhea    Antimicrobials this admission: 3/18 vancomycin >>  3/18 cefepime >>   Dose adjustments this admission: n/a  Microbiology results: No recent cultures    Thank you for allowing pharmacy to be a part of this patient's care.  Gwenlyn Perking, PharmD PGY1 Pharmacy Resident Pager: [phone] 03/14/2017 9:57 AM

## 2017-03-15 DIAGNOSIS — R0600 Dyspnea, unspecified: Secondary | ICD-10-CM

## 2017-03-15 DIAGNOSIS — R0609 Other forms of dyspnea: Secondary | ICD-10-CM

## 2017-03-15 LAB — CBC
HCT: 33.1 % — ABNORMAL LOW (ref 39.0–52.0)
HEMOGLOBIN: 10.8 g/dL — AB (ref 13.0–17.0)
MCH: 28.8 pg (ref 26.0–34.0)
MCHC: 32.6 g/dL (ref 30.0–36.0)
MCV: 88.3 fL (ref 78.0–100.0)
Platelets: 388 10*3/uL (ref 150–400)
RBC: 3.75 MIL/uL — ABNORMAL LOW (ref 4.22–5.81)
RDW: 15.5 % (ref 11.5–15.5)
WBC: 8.3 10*3/uL (ref 4.0–10.5)

## 2017-03-15 LAB — BASIC METABOLIC PANEL
Anion gap: 11 (ref 5–15)
BUN: 18 mg/dL (ref 6–20)
CALCIUM: 8.7 mg/dL — AB (ref 8.9–10.3)
CO2: 32 mmol/L (ref 22–32)
Chloride: 90 mmol/L — ABNORMAL LOW (ref 101–111)
Creatinine, Ser: 1 mg/dL (ref 0.61–1.24)
Glucose, Bld: 105 mg/dL — ABNORMAL HIGH (ref 65–99)
Potassium: 3.8 mmol/L (ref 3.5–5.1)
Sodium: 133 mmol/L — ABNORMAL LOW (ref 135–145)

## 2017-03-15 MED ORDER — LEVALBUTEROL HCL 0.63 MG/3ML IN NEBU
0.6300 mg | INHALATION_SOLUTION | RESPIRATORY_TRACT | Status: DC
  Administered 2017-03-15 (×3): 0.63 mg via RESPIRATORY_TRACT
  Filled 2017-03-15 (×3): qty 3

## 2017-03-15 MED ORDER — SODIUM CHLORIDE 0.9 % IV SOLN
INTRAVENOUS | Status: DC
  Administered 2017-03-15 – 2017-03-16 (×2): via INTRAVENOUS

## 2017-03-15 NOTE — Progress Notes (Signed)
Patient's family member stopped multiple staff in the hallway, requesting more water for the patient. Per Pt's nurse, he is on a fluid restriction and should not have more water at this time.   Message relayed to patient and his family. Pt and his family become agitated with this nurse, pts assigned nurse notified that pt and his family have further concerns regarding this information and its inaccuracy.

## 2017-03-15 NOTE — Progress Notes (Signed)
Physical Therapy Treatment Patient Details Name: Paul Valdez MRN: [id number] DOB: Jan 09, 1946 Today's Date: 03/15/2017    History of Present Illness Patient is a 71 yo male admitted 03/12/17 with SOB, resp failure with hypoxia, anemia.     PMH:  Lung CA s/p LUL lobectomy 01/2017, HTN, HLD, CAD, Afib, CHF, home O2 at 3L, arthritis, sleep apnea.    PT Comments    Patient seen for mobility progression, tolerated increased ambulation with use of RW. Ambulated on 4 liters with O2 saturations dropping to 86% with activity. Encouraged pursed lip breathing and rest break, HR elevated upper 130s. Increased rest period required to rebound. Current POC remains appropriate. Will follow.   Follow Up Recommendations  Home health PT;Supervision - Intermittent     Equipment Recommendations  None recommended by PT    Recommendations for Other Services       Precautions / Restrictions Precautions Precautions: Fall Precaution Comments: On O2 at 4L Restrictions Weight Bearing Restrictions: No    Mobility  Bed Mobility Overal bed mobility: Modified Independent             General bed mobility comments: Increased time and use of bed rail.  Transfers Overall transfer level: Needs assistance Equipment used: Rolling walker (2 wheeled);None Transfers: Sit to/from Stand Sit to Stand: Min guard         General transfer comment: Vcs for hand placement min guard for safety  Ambulation/Gait Ambulation/Gait assistance: Min guard Ambulation Distance (Feet): 210 Feet Assistive device: Rolling walker (2 wheeled) Gait Pattern/deviations: Step-through pattern;Decreased stride length;Drifts right/left;Trunk flexed Gait velocity: Decreased   General Gait Details: Verbal cues to stand upright and stay closer to RW.  Patient drifts to Lt and Rt while inside RW.  No loss of balance during gait.  O2 at 4L during gait. Required 2 standing rest breaks.  Patient's HR increased with activity as high as  153.  RN present and checked on patient.  Pt reports no chest pain or SOB.  Once sitting at EOB, HR began decreasing.   Stairs            Wheelchair Mobility    Modified Rankin (Stroke Patients Only)       Balance Overall balance assessment: Needs assistance   Sitting balance-Leahy Scale: Good     Standing balance support: Bilateral upper extremity supported Standing balance-Leahy Scale: Fair                      Cognition Arousal/Alertness: Awake/alert Behavior During Therapy: WFL for tasks assessed/performed Overall Cognitive Status: No family/caregiver present to determine baseline cognitive functioning                      Exercises      General Comments        Pertinent Vitals/Pain Pain Assessment: No/denies pain    Home Living                      Prior Function            PT Goals (current goals can now be found in the care plan section) Acute Rehab PT Goals Patient Stated Goal: To get stronger and return home. PT Goal Formulation: With patient Time For Goal Achievement: 03/20/17 Potential to Achieve Goals: Good Progress towards PT goals: Progressing toward goals    Frequency    Min 3X/week      PT Plan Current plan remains appropriate  Co-evaluation             End of Session Equipment Utilized During Treatment: Gait belt;Oxygen Activity Tolerance: Patient tolerated treatment well Patient left: in chair;with call bell/phone within reach Nurse Communication: Mobility status (HR increase with activity, desaturation to 86%) PT Visit Diagnosis: Unsteadiness on feet (R26.81);Muscle weakness (generalized) (M62.81);Difficulty in walking, not elsewhere classified (R26.2)     Time: 1210-1229 PT Time Calculation (min) (ACUTE ONLY): 19 min  Charges:  $Gait Training: 8-22 mins                    G Codes:       Duncan Dull 04/13/2017, 1:23 PM Alben Deeds, [place] DPT  [phone]

## 2017-03-15 NOTE — Progress Notes (Signed)
Progress Note  Patient Name: Paul Valdez Date of Encounter: 03/15/2017  Primary Cardiologist: [place] [facility]. Unable to get on schedule for DCCV today.  Inpatient Medications    Scheduled Meds: . apixaban  5 mg Oral BID  . atorvastatin  40 mg Oral QPM  . ceFEPime (MAXIPIME) IV  1 g Intravenous Q8H  . furosemide  80 mg Intravenous BID  . gabapentin  600 mg Oral TID  . hydrocortisone   Rectal TID  . levalbuterol  0.63 mg Nebulization Q4H  . LORazepam  0.5 mg Oral QHS  . mouth rinse  15 mL Mouth Rinse BID  . pantoprazole  40 mg Oral Daily  . potassium chloride (KCL MULTIRUN) 30 mEq in 265 mL IVPB  30 mEq Intravenous Once  . potassium chloride  40 mEq Oral BID  . sertraline  100 mg Oral Daily  . sodium chloride flush  3 mL Intravenous Q12H  . tamsulosin  0.4 mg Oral QPC supper  . vancomycin  1,000 mg Intravenous Q12H  . [START ON 03/16/2017] Vitamin D (Ergocalciferol)  50,000 Units Oral Q7 days   Continuous Infusions: . diltiazem (CARDIZEM) infusion 5 mg/hr (03/15/17 0853)   PRN Meds: sodium chloride, acetaminophen, guaiFENesin-dextromethorphan, hydrALAZINE, ondansetron (ZOFRAN) IV, sodium chloride flush   Vital Signs    Vitals:   03/14/17 1707 03/14/17 2119 03/15/17 0534 03/15/17 0929  BP:  119/65 119/78   Pulse:  (!) 110 (!) 111   Resp:  18 18   Temp:  98.4 F (36.9 C) 98.1 F (36.7 C)   TempSrc:  Oral Oral   SpO2: 94% 94% 93% 94%  Weight:   184 lb 4.8 oz (83.6 kg)   Height:        Intake/Output Summary (Last 24 hours) at 03/15/17 1203 Last data filed at 03/15/17 0931  Gross per 24 hour  Intake             1033 ml  Output             2350 ml  Net            -1317 ml   Filed Weights   03/13/17 0648 03/14/17 0437 03/15/17 0534  Weight: 185 lb 9.6 oz (84.2 kg) 182 lb 8 oz (82.8 kg) 184 lb 4.8 oz (83.6 kg)    Telemetry    AFib, variable rate control - Personally Reviewed  ECG    AFib with RVR - Personally  Reviewed  Physical Exam   GEN: No acute distress.   Neck: No JVD Cardiac: irregularly irregular, no murmurs, rubs, or gallops.  Respiratory: Clear to auscultation bilaterally. GI: Soft, nontender, non-distended  MS: No edema; No deformity. Neuro:  Nonfocal  Psych: Normal affect   Labs    Chemistry Recent Labs Lab 03/09/17 1207  03/13/17 0503 03/14/17 0510 03/14/17 1127 03/15/17 0458  NA 138  < > 137 133*  --  133*  K 3.9  < > 3.4* 2.8* 3.3* 3.8  CL  --   < > 93* 87*  --  90*  CO2 29  < > 35* 35*  --  32  GLUCOSE 111  < > 112* 110*  --  105*  BUN 12.5  < > 14 16  --  18  CREATININE 1.0  < > 1.05 1.02  --  1.00  CALCIUM 9.6  < > 8.7* 8.8*  --  8.7*  PROT 7.3  --   --   --   --   --  ALBUMIN 2.9*  --   --   --   --   --   AST 15  --   --   --   --   --   ALT 13  --   --   --   --   --   ALKPHOS 90  --   --   --   --   --   BILITOT 0.86  --   --   --   --   --   GFRNONAA  --   < > >60 >60  --  >60  GFRAA  --   < > >60 >60  --  >60  ANIONGAP 11  < > 9 11  --  11  < > = values in this interval not displayed.   Hematology Recent Labs Lab 03/12/17 1149 03/13/17 0503 03/15/17 0458  WBC 9.7 6.9 8.3  RBC 3.47* 3.51* 3.75*  HGB 9.9* 10.1* 10.8*  HCT 31.2* 31.3* 33.1*  MCV 89.9 89.2 88.3  MCH 28.5 28.8 28.8  MCHC 31.7 32.3 32.6  RDW 15.7* 15.7* 15.5  PLT 321 300 388    Cardiac Enzymes Recent Labs Lab 03/14/17 0820  TROPONINI 0.04*    Recent Labs Lab 03/12/17 1345  TROPIPOC 0.01     BNP Recent Labs Lab 03/12/17 1317  BNP 609.6*     DDimer  Recent Labs Lab 03/14/17 0820  DDIMER 3.48*     Radiology    Ct Angio Chest Pe W Or Wo Contrast  Result Date: 03/14/2017 CLINICAL DATA:  71 year old male with hypoxia and shortness of breath. History of AFib. History of known cancer and left upper lobectomy. EXAM: CT ANGIOGRAPHY CHEST WITH CONTRAST TECHNIQUE: Multidetector CT imaging of the chest was performed using the standard protocol during bolus  administration of intravenous contrast. Multiplanar CT image reconstructions and MIPs were obtained to evaluate the vascular anatomy. CONTRAST:  100 cc Isovue 370 COMPARISON:  Chest CT dated 02/15/2017 FINDINGS: Cardiovascular: There is mild cardiomegaly. There is dilatation of the atria. There is reflux of contrast from the right atrium into the IVC consistent with a degree of right cardiac dysfunction. Correlation with echocardiogram recommended. There is no pericardial effusion. There is mild atherosclerotic calcification of the thoracic aorta. The aorta is otherwise unremarkable for the degree of enhancement. There is no CT evidence of pulmonary embolus. Mediastinum/Nodes: Mildly enlarged right hilar lymph nodes measuring 15 mm in short axis. There is no mediastinal adenopathy. There is a moderate size hiatal hernia containing portion of the stomach. The esophagus is grossly unremarkable. Lungs/Pleura: There is postsurgical changes are left upper lobectomy. Persistent small left pleural effusion versus pleural thickening similar to prior CT. There is background of chronic parenchymal disease with subpleural blebs primarily in the right upper and right lower lobe. There is diffuse ground-glass density throughout the lungs with patchy areas of airspace opacity in the right upper lobe and superior portion of the left lung as well as bilateral patchy airspace disease involving the lung bases which are new compared to prior study and most consistent with superimposed pneumonia. Bilateral peribronchial thickening noted. There is no pneumothorax. The central airways are patent. Upper Abdomen: Moderate size hiatal hernia. Nonobstructing bilateral renal calculi measuring up to 6 mm in the interpolar aspect of the left kidney. Colonic diverticulosis. Moderate size hiatal hernia. Musculoskeletal: No chest wall abnormality. No acute or significant osseous findings. Review of the MIP images confirms the above findings.  IMPRESSION: 1. No CT  evidence of pulmonary embolism. 2. Diffuse patchy airspace disease, new from prior study and most compatible with pneumonia. Clinical correlation and follow-up to resolution recommended. There is a background of chronic lung disease. 3. Postsurgical changes of left upper lobectomy with persistent small left pleural effusion versus pleural thickening. 4. Mild cardiomegaly with evidence of right cardiac dysfunction. Correlation with echocardiogram recommended. Electronically Signed   By: Anner Crete M.D.   On: 03/14/2017 18:10   Dg Chest Port 1v Same Day  Result Date: 03/14/2017 CLINICAL DATA:  Dyspnea.  Previous left lobectomy for lung cancer. EXAM: PORTABLE CHEST 1 VIEW COMPARISON:  03/13/2017 and 03/12/2017 as well as 02/15/2017 FINDINGS: Lungs are somewhat hypoinflated and demonstrate postsurgical changes of the left lung/ hemithorax compatible previous lobectomy for lung cancer. There is mild overall worsening a path hazy opacification over the left lung as well as slight worsening mixed interstitial airspace density over the right upper lobe as cannot exclude developing infection. No definite effusion. Remainder the exam is unchanged. IMPRESSION: Slight interval worsening opacification over the left lung on background of postsurgical change as well as mild worsen mixed interstitial airspace density over the right upper lobe as cannot exclude developing infection. Electronically Signed   By: Marin Olp M.D.   On: 03/14/2017 08:38    Cardiac Studies   EF 60-65% in 2/18  Patient Profile     71 y.o. male with AFib with RVR in the setting of pneumonia  Assessment & Plan    Plan for DCCV tomorrow.  COntinue anticoagulation.  Has diuresed well to treat acute on chronic diastolic heart failure. Anticoagulated.   DOE: Likely multifactorial.  s/p lung surgery.  ABx for pneumonia.  Liklely trigger for increased rates.   COPD: COntinue oxygen.   Signed, Larae Grooms, MD  03/15/2017, 12:03 PM

## 2017-03-15 NOTE — Progress Notes (Signed)
[facility]                                                                              Patient Demographics  Paul Valdez, is a 71 y.o. male, DOB - 01-Oct-1946, HMC:[id number]  Admit date - 03/12/2017   Admitting Physician Elwin Mocha, MD  Outpatient Primary MD for the patient is Gara Kroner, MD  Outpatient specialists:   LOS - 2  days    Chief Complaint  Patient presents with  . Shortness of Breath       Brief summary   Patient is a 71 year old male with hypertension, hyperlipidemia, CAD, COPD, A. fib, mass in his upper lobe of left lung status post lobectomy presented with shortness of breath. Patient had upper lobe lobectomy in 2/18 secondary to squamous cell carcinoma, subsequently was readmitted again with HCAP  was DC'd on 3 L of O2. He was also discharged home on Lasix 40 mg daily. Per patient he was noticed to have hypoxia with O2 sats of 65% by his home health nurse and was recommended to come to the ER. Otherwise he denied any chest pain, palpitations, coughing or any lower extremity edema.He reports compliance with his oxygen at home but at the time of admission is wondering if perhaps the machine is "malfunctioning". He reports mild intermittent nonproductive cough In the ER, O2 sats were 88% on 3 L and was given Lasix intravenously and admitted for further workup  Assessment & Plan    Principal Problem: Acute respiratory failure with hypoxia likely related to acute on chronic diastolic heart failure, recent left upper lobe lobectomy. -  Chest x-ray on admission with diffuse interstitial and alvolar opacities throughout the lungs which could reflect edema or infection. BNP was elevated 609. Temperature 99.4 no leukocytosis, no lactic acidosis. Patient was admitted and placed on IV diuresis. - On 3/18, patient was noticed to be desatting, was placed on 6 L, in A. fib with RVR. Chest x-ray also showed possible pneumonia. CT chest showed no PE but  diffuse patchy airspace disease, new, compatible with pneumonia, was placed on vancomycin and cefepime  - Placed on Xopenex nebs for wheezing   Active problems  Acute on chronic diastolic heart failure. Echo done in February 2018 reveals EF of 60% mild LVH. Patient was discharged on Lasix 40 mg daily  - Continue IV diuresis currently on 80 mg IV BID,  - Continue strict I's and O's and daily weights, negative balance of 5.8 L - Weight down from 194 on admission to 184 today (was 189 lbs on the day of discharge on 2/19) - Likely will need higher dose of Lasix at the time of discharge. - Cardiology consulted, per cardiology, likely atrial fibrillation is contributing to CHF and need to restore sinus rhythm. Planning TEE and cardioversion today 3/19  Atrial fibrillation with RVR - Rate still not well controlled  -  chadvasc score 5. continue eliquis - Planning TEE and cardioversion today 3/19  Hypertension. -Continue home meds  Anemia. Likely related to chronic disease. Hemoglobin 9.9 on admission. Chart review indicates this is close to his baseline.    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  - Patient following Dr. Julien Nordmann and Dr Cyndia Bent   Dyslipidemia  continue statin   Code Status: full  DVT Prophylaxis:  SCD's Family Communication: Discussed in detail with the patient, all imaging results, lab results explained to the patient and HPOA    Disposition Plan:  Time Spent in minutes   35 minutes  Procedures:    Consultants:   Cardiology  Antimicrobials:   IV vancomycin 3/18  IV cefepime 3/18   Medications  Scheduled Meds: . apixaban  5 mg Oral BID  . atorvastatin  40 mg Oral QPM  . ceFEPime (MAXIPIME) IV  1 g Intravenous Q8H  . furosemide  80 mg Intravenous BID  . gabapentin  600 mg Oral TID  . hydrocortisone   Rectal TID  . levalbuterol  0.63 mg Nebulization Q4H  . LORazepam  0.5 mg Oral QHS  . mouth rinse  15 mL Mouth  Rinse BID  . pantoprazole  40 mg Oral Daily  . potassium chloride (KCL MULTIRUN) 30 mEq in 265 mL IVPB  30 mEq Intravenous Once  . potassium chloride  40 mEq Oral BID  . sertraline  100 mg Oral Daily  . sodium chloride flush  3 mL Intravenous Q12H  . tamsulosin  0.4 mg Oral QPC supper  . vancomycin  1,000 mg Intravenous Q12H  . [START ON 02/28/2017] Vitamin D (Ergocalciferol)  50,000 Units Oral Q7 days   Continuous Infusions: . diltiazem (CARDIZEM) infusion 5 mg/hr (03/15/17 0853)   PRN Meds:.sodium chloride, acetaminophen, guaiFENesin-dextromethorphan, hydrALAZINE, ondansetron (ZOFRAN) IV, sodium chloride flush   Antibiotics   Anti-infectives    Start     Dose/Rate Route Frequency Ordered Stop   03/14/17 2200  vancomycin (VANCOCIN) IVPB 1000 mg/200 mL premix     1,000 mg 200 mL/hr over 60 Minutes Intravenous Every 12 hours 03/14/17 0928     03/14/17 0930  ceFEPIme (MAXIPIME) 1 g in dextrose 5 % 50 mL IVPB     1 g 100 mL/hr over 30 Minutes Intravenous Every 8 hours 03/14/17 0925     03/14/17 0930  vancomycin (VANCOCIN) 1,750 mg in sodium chloride 0.9 % 500 mL IVPB     1,750 mg 250 mL/hr over 120 Minutes Intravenous  Once 03/14/17 1448 03/14/17 1221        Subjective:   Paul Valdez was seen and examined today. Today feeling somewhat better with shortness of breath however wheezing. Heart rate still not well controlled. O2 sats 94% on 4 L. No chest pain.  Patient denies dizziness, abdominal pain, N/V/D/C, new weakness, numbess, tingling. No acute events overnight.    Objective:   Vitals:   03/14/17 1707 03/14/17 2119 03/15/17 0534 03/15/17 0929  BP:  119/65 119/78   Pulse:  (!) 110 (!) 111   Resp:  18 18   Temp:  98.4 F (36.9 C) 98.1 F (36.7 C)   TempSrc:  Oral Oral   SpO2: 94% 94% 93% 94%  Weight:   83.6 kg (184 lb 4.8 oz)   Height:        Intake/Output Summary (Last 24 hours) at 03/15/17 1040 Last data filed at 03/15/17 0931  Gross per 24 hour  Intake              1033 ml  Output             2350 ml  Net            -  1317 ml     Wt Readings from Last 3 Encounters:  03/15/17 83.6 kg (184 lb 4.8 oz)  03/09/17 88.4 kg (194 lb 12.8 oz)  03/01/17 89.3 kg (196 lb 12.8 oz)     Exam  General: Alert and oriented x 3, NAD  HEENT:     Neck: Supple, + JVD  Cardiovascular: Tachycardiac, irregularly irregular,    Respiratory: Bilateral expiratory wheezing, with bibasilar crackles  Gastrointestinal: Soft, nontender, nondistended, + bowel sounds  Ext: no cyanosis clubbing, edema  Neuro: no new deficits  Skin: No rashes  Psych: Normal affect and demeanor, alert and oriented x3    Data Reviewed:  I have personally reviewed following labs and imaging studies  Micro Results Recent Results (from the past 240 hour(s))  MRSA PCR Screening     Status: None   Collection Time: 03/14/17 10:37 AM  Result Value Ref Range Status   MRSA by PCR NEGATIVE NEGATIVE Final    Comment:        The GeneXpert MRSA Assay (FDA approved for NASAL specimens only), is one component of a comprehensive MRSA colonization surveillance program. It is not intended to diagnose MRSA infection nor to guide or monitor treatment for MRSA infections.     Radiology Reports Dg Chest 2 View  Result Date: 03/13/2017 CLINICAL DATA:  Shortness of breath, improved.  Ex-smoker. EXAM: CHEST  2 VIEW COMPARISON:  03/12/2017. FINDINGS: Increased airspace opacity throughout the left lung and in the right lower lung zone. Mildly progressive prominence of the interstitial markings. Moderately large hiatal hernia. Mediastinal surgical clips. The airspace opacity on the left is obscuring the heart borders. Right diaphragmatic eventration. Diffuse osteopenia. IMPRESSION: 1. Progressive changes of congestive heart failure. 2. Moderately large hiatal hernia. Electronically Signed   By: Claudie Revering M.D.   On: 03/13/2017 10:51   Dg Chest 2 View  Result Date: 03/12/2017 CLINICAL DATA:   Low O2 sats. Prior left upper lobectomy. Shortness of breath. EXAM: CHEST  2 VIEW COMPARISON:  02/24/2017 FINDINGS: Postoperative changes in the left upper lung. Diffuse airspace disease and interstitial prominence throughout both lungs. This could represent edema or infection. Mild cardiomegaly. Large hiatal hernia. No effusions. IMPRESSION: Diffuse interstitial and alveolar opacities throughout the lungs which could reflect edema or infection. Large hiatal hernia. Electronically Signed   By: Rolm Baptise M.D.   On: 03/12/2017 12:27   Dg Chest 2 View  Result Date: 02/24/2017 CLINICAL DATA:  Status post left upper lobectomy on February 01, 2017 for squamous cell malignancy. No current chest complaints. History of asthma and atrial fibrillation, coronary artery disease. No current complaints. EXAM: CHEST  2 VIEW COMPARISON:  Chest x-ray of February 15, 2017 and CT scan of the chest of the same day. FINDINGS: The right lung is adequately inflated. Prominent elevation of a portion of the right hemidiaphragm is stable. On the left there are post upper lobectomy changes. There is no alveolar infiltrate. There is no significant pleural effusion and no pneumothorax. The heart is normal in size. There is calcification in the wall of the aortic arch. The pulmonary vascularity is nearly normal. There is a moderate-sized hiatal hernia. IMPRESSION: Further interval improvement in the appearance of the chest with improved aeration and decreased pulmonary interstitial edema. Persistent large hiatal hernia. Thoracic aortic atherosclerosis. Electronically Signed   By: David  Martinique M.D.   On: 02/24/2017 15:47   Ct Angio Chest Pe W Or Wo Contrast  Result Date: 03/14/2017 CLINICAL DATA:  71 year old male  with hypoxia and shortness of breath. History of AFib. History of known cancer and left upper lobectomy. EXAM: CT ANGIOGRAPHY CHEST WITH CONTRAST TECHNIQUE: Multidetector CT imaging of the chest was performed using the  standard protocol during bolus administration of intravenous contrast. Multiplanar CT image reconstructions and MIPs were obtained to evaluate the vascular anatomy. CONTRAST:  100 cc Isovue 370 COMPARISON:  Chest CT dated 02/15/2017 FINDINGS: Cardiovascular: There is mild cardiomegaly. There is dilatation of the atria. There is reflux of contrast from the right atrium into the IVC consistent with a degree of right cardiac dysfunction. Correlation with echocardiogram recommended. There is no pericardial effusion. There is mild atherosclerotic calcification of the thoracic aorta. The aorta is otherwise unremarkable for the degree of enhancement. There is no CT evidence of pulmonary embolus. Mediastinum/Nodes: Mildly enlarged right hilar lymph nodes measuring 15 mm in short axis. There is no mediastinal adenopathy. There is a moderate size hiatal hernia containing portion of the stomach. The esophagus is grossly unremarkable. Lungs/Pleura: There is postsurgical changes are left upper lobectomy. Persistent small left pleural effusion versus pleural thickening similar to prior CT. There is background of chronic parenchymal disease with subpleural blebs primarily in the right upper and right lower lobe. There is diffuse ground-glass density throughout the lungs with patchy areas of airspace opacity in the right upper lobe and superior portion of the left lung as well as bilateral patchy airspace disease involving the lung bases which are new compared to prior study and most consistent with superimposed pneumonia. Bilateral peribronchial thickening noted. There is no pneumothorax. The central airways are patent. Upper Abdomen: Moderate size hiatal hernia. Nonobstructing bilateral renal calculi measuring up to 6 mm in the interpolar aspect of the left kidney. Colonic diverticulosis. Moderate size hiatal hernia. Musculoskeletal: No chest wall abnormality. No acute or significant osseous findings. Review of the MIP images  confirms the above findings. IMPRESSION: 1. No CT evidence of pulmonary embolism. 2. Diffuse patchy airspace disease, new from prior study and most compatible with pneumonia. Clinical correlation and follow-up to resolution recommended. There is a background of chronic lung disease. 3. Postsurgical changes of left upper lobectomy with persistent small left pleural effusion versus pleural thickening. 4. Mild cardiomegaly with evidence of right cardiac dysfunction. Correlation with echocardiogram recommended. Electronically Signed   By: Anner Crete M.D.   On: 03/14/2017 18:10   Ct Angio Chest Pe W Or Wo Contrast  Result Date: 02/15/2017 CLINICAL DATA:  Lung cancer, CHF, possible health care associated pneumonia, LEFT lower extremity swelling acute onset of shortness of breath/dyspnea question pulmonary embolism ; history hypertension, coronary artery disease post PTCA, asthma, GERD, former smoker, atrial fibrillation EXAM: CT ANGIOGRAPHY CHEST WITH CONTRAST TECHNIQUE: Multidetector CT imaging of the chest was performed using the standard protocol during bolus administration of intravenous contrast. Multiplanar CT image reconstructions and MIPs were obtained to evaluate the vascular anatomy. CONTRAST:  100 cc Isovue 370 IV COMPARISON:  PET-CT 01/04/2017 FINDINGS: Cardiovascular: Atherosclerotic calcifications aorta and minimally in coronary arteries. Aneurysmal dilatation ascending thoracic aorta 4.3 cm transverse image 45. Scattered beam hardening artifacts from dense contrast in the SVC. Pulmonary arteries well opacified and patent. No evidence of pulmonary embolism. No pericardial effusion. Enlargement of cardiac chambers. Mediastinum/Nodes: Large hiatal hernia. Base of cervical region normal appearance. No thoracic adenopathy. Lungs/Pleura: Post LEFT upper lobectomy LEFT pleural thickening/fluid in the LEFT hemithorax. Partial atelectasis of LEFT lower lobe. Peripheral atelectasis and a few subpleural  blebs in RIGHT lung. No definite infiltrate or pneumothorax.  Significant elevation of RIGHT diaphragm. Upper Abdomen: Unremarkable Musculoskeletal: Diffuse osseous demineralization. Review of the MIP images confirms the above findings. IMPRESSION: No evidence of pulmonary embolism. Scattered atelectasis RIGHT lung with postsurgical changes in LEFT lung post LEFT upper lobectomy. Hiatal hernia. Aortic atherosclerosis and coronary arterial calcification with aneurysmal dilatation of the ascending thoracic aorta, 4.3 cm transverse, recommendation below. Recommend annual imaging followup by CTA or MRA. This recommendation follows 2010 ACCF/AHA/AATS/ACR/ASA/SCA/SCAI/SIR/STS/SVM Guidelines for the Diagnosis and Management of Patients with Thoracic Aortic Disease. Circulation. 2010; 121: U440-H474 Electronically Signed   By: Lavonia Dana M.D.   On: 02/15/2017 11:37   Dg Chest Port 1 View  Result Date: 02/15/2017 CLINICAL DATA:  71 year old male with fever and shortness of breath. EXAM: PORTABLE CHEST 1 VIEW COMPARISON:  Chest radiograph dated 02/09/2017 FINDINGS: There is low lung volumes. There progression of airspace density involving the left mid to lower lung fields concerning for developing infiltrate. Right mid and lower lung field interstitial prominence and streaky densities noted. There is no significant pleural effusion. No pneumothorax. There is silhouetting of the cardiac borders. Multiple surgical clips noted in the left hilar and suprahilar region. There is minimally displaced fracture of the lateral left fifth rib which appears new compared to prior study. Correlation with point tenderness recommended. IMPRESSION: 1. Increased left mid for lower lung field airspace opacity concerning for developing pneumonia versus pulmonary contusion. Right mid to lower lung field interstitial prominence and streaky densities may represent atelectasis versus infiltrate. 2. Minimally displaced fracture of the lateral  aspect of the left fifth rib, new from prior study. No pneumothorax. Electronically Signed   By: Anner Crete M.D.   On: 02/15/2017 04:10   Dg Chest Port 1v Same Day  Result Date: 03/14/2017 CLINICAL DATA:  Dyspnea.  Previous left lobectomy for lung cancer. EXAM: PORTABLE CHEST 1 VIEW COMPARISON:  03/13/2017 and 03/12/2017 as well as 02/15/2017 FINDINGS: Lungs are somewhat hypoinflated and demonstrate postsurgical changes of the left lung/ hemithorax compatible previous lobectomy for lung cancer. There is mild overall worsening a path hazy opacification over the left lung as well as slight worsening mixed interstitial airspace density over the right upper lobe as cannot exclude developing infection. No definite effusion. Remainder the exam is unchanged. IMPRESSION: Slight interval worsening opacification over the left lung on background of postsurgical change as well as mild worsen mixed interstitial airspace density over the right upper lobe as cannot exclude developing infection. Electronically Signed   By: Marin Olp M.D.   On: 03/14/2017 08:38    Lab Data:  CBC:  Recent Labs Lab 03/09/17 1207 03/12/17 1149 03/13/17 0503 03/15/17 0458  WBC 9.4 9.7 6.9 8.3  NEUTROABS 6.6* 6.6  --   --   HGB 11.1* 9.9* 10.1* 10.8*  HCT 34.1* 31.2* 31.3* 33.1*  MCV 89.5 89.9 89.2 88.3  PLT 228 321 300 259   Basic Metabolic Panel:  Recent Labs Lab 03/09/17 1207 03/12/17 1149 03/13/17 0503 03/14/17 0510 03/14/17 0820 03/14/17 1127 03/15/17 0458  NA 138 136 137 133*  --   --  133*  K 3.9 3.4* 3.4* 2.8*  --  3.3* 3.8  CL  --  99* 93* 87*  --   --  90*  CO2 29 25 35* 35*  --   --  32  GLUCOSE 111 131* 112* 110*  --   --  105*  BUN 12.'5 12 14 16  '$ --   --  18  CREATININE 1.0 1.03 1.05 1.02  --   --  1.00  CALCIUM 9.6 8.6* 8.7* 8.8*  --   --  8.7*  MG  --  1.5* 2.2  --  1.8  --   --    GFR: Estimated Creatinine Clearance: 70.1 mL/min (by C-G formula based on SCr of 1 mg/dL). Liver  Function Tests:  Recent Labs Lab 03/09/17 1207  AST 15  ALT 13  ALKPHOS 90  BILITOT 0.86  PROT 7.3  ALBUMIN 2.9*   No results for input(s): LIPASE, AMYLASE in the last 168 hours. No results for input(s): AMMONIA in the last 168 hours. Coagulation Profile: No results for input(s): INR, PROTIME in the last 168 hours. Cardiac Enzymes:  Recent Labs Lab 03/14/17 0820  TROPONINI 0.04*   BNP (last 3 results) No results for input(s): PROBNP in the last 8760 hours. HbA1C: No results for input(s): HGBA1C in the last 72 hours. CBG: No results for input(s): GLUCAP in the last 168 hours. Lipid Profile: No results for input(s): CHOL, HDL, LDLCALC, TRIG, CHOLHDL, LDLDIRECT in the last 72 hours. Thyroid Function Tests: No results for input(s): TSH, T4TOTAL, FREET4, T3FREE, THYROIDAB in the last 72 hours. Anemia Panel: No results for input(s): VITAMINB12, FOLATE, FERRITIN, TIBC, IRON, RETICCTPCT in the last 72 hours. Urine analysis:    Component Value Date/Time   COLORURINE AMBER (A) 02/15/2017 0344   APPEARANCEUR HAZY (A) 02/15/2017 0344   LABSPEC 1.016 02/15/2017 0344   PHURINE 5.0 02/15/2017 0344   GLUCOSEU NEGATIVE 02/15/2017 0344   HGBUR MODERATE (A) 02/15/2017 0344   BILIRUBINUR NEGATIVE 02/15/2017 0344   KETONESUR NEGATIVE 02/15/2017 0344   PROTEINUR 30 (A) 02/15/2017 0344   NITRITE POSITIVE (A) 02/15/2017 0344   LEUKOCYTESUR SMALL (A) 02/15/2017 0344     Merrik Puebla M.D. [facility] 03/15/2017, 10:40 AM  Pager: [phone] Between 7am to 7pm - call Pager - [phone]  After 7pm go to [email or web address] - password [email or web address]  Call night coverage person covering after 7pm

## 2017-03-15 NOTE — Progress Notes (Signed)
Patient scheduled for TEE tomorrow at 10am. Cardiology NP notified for diet order. Patient made aware of procedure for tomorrow and has signed consent.

## 2017-03-16 ENCOUNTER — Inpatient Hospital Stay (HOSPITAL_COMMUNITY): Payer: Medicare Other

## 2017-03-16 ENCOUNTER — Encounter (HOSPITAL_COMMUNITY): Payer: Self-pay | Admitting: *Deleted

## 2017-03-16 ENCOUNTER — Ambulatory Visit: Payer: Medicare Other | Admitting: Internal Medicine

## 2017-03-16 ENCOUNTER — Inpatient Hospital Stay (HOSPITAL_COMMUNITY): Payer: Medicare Other | Admitting: Certified Registered Nurse Anesthetist

## 2017-03-16 ENCOUNTER — Encounter (HOSPITAL_COMMUNITY)
Admission: EM | Disposition: A | Discharge status: Home Health | Payer: Self-pay | Source: Home / Self Care | Attending: Internal Medicine

## 2017-03-16 DIAGNOSIS — I4891 Unspecified atrial fibrillation: Secondary | ICD-10-CM

## 2017-03-16 DIAGNOSIS — J9601 Acute respiratory failure with hypoxia: Secondary | ICD-10-CM

## 2017-03-16 DIAGNOSIS — R0603 Acute respiratory distress: Secondary | ICD-10-CM

## 2017-03-16 HISTORY — PX: CARDIOVERSION: SHX1299

## 2017-03-16 HISTORY — PX: TEE WITHOUT CARDIOVERSION: SHX5443

## 2017-03-16 LAB — CBC
HEMATOCRIT: 33.1 % — AB (ref 39.0–52.0)
Hemoglobin: 10.5 g/dL — ABNORMAL LOW (ref 13.0–17.0)
MCH: 27.9 pg (ref 26.0–34.0)
MCHC: 31.7 g/dL (ref 30.0–36.0)
MCV: 88 fL (ref 78.0–100.0)
PLATELETS: 422 10*3/uL — AB (ref 150–400)
RBC: 3.76 MIL/uL — ABNORMAL LOW (ref 4.22–5.81)
RDW: 15 % (ref 11.5–15.5)
WBC: 8.2 10*3/uL (ref 4.0–10.5)

## 2017-03-16 LAB — BASIC METABOLIC PANEL
Anion gap: 12 (ref 5–15)
BUN: 16 mg/dL (ref 6–20)
CO2: 35 mmol/L — ABNORMAL HIGH (ref 22–32)
Calcium: 8.7 mg/dL — ABNORMAL LOW (ref 8.9–10.3)
Chloride: 87 mmol/L — ABNORMAL LOW (ref 101–111)
Creatinine, Ser: 0.99 mg/dL (ref 0.61–1.24)
GFR calc Af Amer: >60 mL/min (ref >60)
GFR calc non Af Amer: >60 mL/min (ref >60)
Glucose, Bld: 115 mg/dL — ABNORMAL HIGH (ref 65–99)
Potassium: 3.4 mmol/L — ABNORMAL LOW (ref 3.5–5.1)
Sodium: 134 mmol/L — ABNORMAL LOW (ref 135–145)

## 2017-03-16 SURGERY — CARDIOVERSION
Anesthesia: General

## 2017-03-16 MED ORDER — LIDOCAINE 2% (20 MG/ML) 5 ML SYRINGE
INTRAMUSCULAR | Status: DC | PRN
Start: 2017-03-16 — End: 2017-03-16
  Administered 2017-03-16: 20 mg via INTRAVENOUS

## 2017-03-16 MED ORDER — SODIUM CHLORIDE 0.9 % IV SOLN
INTRAVENOUS | Status: DC | PRN
  Administered 2017-03-16: 10:00:00 via INTRAVENOUS

## 2017-03-16 MED ORDER — FLECAINIDE ACETATE 100 MG PO TABS
100.0000 mg | ORAL_TABLET | Freq: Two times a day (BID) | ORAL | Status: DC
  Administered 2017-03-16 – 2017-03-18 (×4): 100 mg via ORAL
  Filled 2017-03-16 (×4): qty 1

## 2017-03-16 MED ORDER — LEVALBUTEROL HCL 0.63 MG/3ML IN NEBU: 0.6300 mg | INHALATION_SOLUTION | RESPIRATORY_TRACT | Status: DC | PRN

## 2017-03-16 MED ORDER — PERFLUTREN LIPID MICROSPHERE
INTRAVENOUS | Status: DC | PRN
  Administered 2017-03-16: 1 mL via INTRAVENOUS

## 2017-03-16 MED ORDER — PROPOFOL 10 MG/ML IV BOLUS
INTRAVENOUS | Status: DC | PRN
  Administered 2017-03-16: 25 mg via INTRAVENOUS
  Administered 2017-03-16: 75 mg via INTRAVENOUS
  Administered 2017-03-16: 40 mg via INTRAVENOUS

## 2017-03-16 MED ORDER — PERFLUTREN LIPID MICROSPHERE
INTRAVENOUS | Status: AC
  Filled 2017-03-16: qty 10

## 2017-03-16 MED ORDER — PROPOFOL 500 MG/50ML IV EMUL
INTRAVENOUS | Status: DC | PRN
  Administered 2017-03-16: 100 ug/kg/min via INTRAVENOUS

## 2017-03-16 MED ORDER — LEVALBUTEROL HCL 0.63 MG/3ML IN NEBU
0.6300 mg | INHALATION_SOLUTION | Freq: Three times a day (TID) | RESPIRATORY_TRACT | Status: DC
  Administered 2017-03-16 – 2017-03-18 (×6): 0.63 mg via RESPIRATORY_TRACT
  Filled 2017-03-16 (×7): qty 3

## 2017-03-16 MED ORDER — DILTIAZEM HCL ER COATED BEADS 180 MG PO CP24
180.0000 mg | ORAL_CAPSULE | Freq: Every day | ORAL | Status: DC
  Administered 2017-03-16 – 2017-03-18 (×3): 180 mg via ORAL
  Filled 2017-03-16 (×3): qty 1

## 2017-03-16 NOTE — H&P (View-Only) (Signed)
CARDIOLOGY CONSULT NOTE   Patient ID: Paul Valdez MRN: [id number] DOB/AGE: May 28, 1946 71 y.o.  Admit date: 03/12/2017  Primary Physician   Gara Kroner, MD Primary Cardiologist   Dr Irish Lack Reason for Consultation   CHF & A Fib Requesting MD: Dr Tana Coast  Paul Valdez is a 71 y.o. year old male with a history of HTN, COPD on home O2 at 3 lpm, asthma, prior tob use, recent dx lung CA. Nl MV 2014, nl Echo 2015.  01/22/2017 Seen by cards preop>>no testing needed. 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   Post-op afib seen>>Elqiuis and Cardizem added, metop d/c'd. Chadsvasc=2 (age x 1, HTN). 02/15/2017 admit for PNA, Echo w/ nl EF, ?diast dysf, mod LA dilatation. 03/01/2017 office visit, confusion over meds, Dilt increased, K+ added, 1 mo f/u, volume ok, wt 196  Admit 03/16 with CHF, wt 194, diuresed to 182. D/c planned. However, when pt evaluated 03/18, he was having increased SOB, and was in rapid afib, cards asked to see.    He was initially diuresed and feeling better. However,yesterday pm, he walked with PT. The walk did not go well. He got very SOB. The SOB did not improve. That is the time he felt his heart start jumping around, feels that is when he went into afib. The PT eval was at 7 pm, His heart rate has been variable since then, is fairly rapid.  He gradually got worse overnight, his O2 is up to 6 lpm.  He is concerned that he will not be able to get stronger. He cannot exert himself 2nd SOB, feels he is getting weaker.   He wants to know what kind of recovery he can expect and how long that will take.   Past Medical History:  Diagnosis Date  . Acute respiratory failure ([place])   . Arthritis   . Asthma   . Atrial fibrillation ([place])   . Atypical chest pain    a. Normal nuc 2014.  . Carotid artery disease ([place])    a. Carotid duplex 2015: 93-90% RICA, 3-00% LICA.   Marland Kitchen Cataract   . CHF (congestive heart failure)  ([place])   . Coronary artery calcification seen on CT scan   . Depression   . Detached retina   . Former consumption of alcohol   . Former tobacco use   . GERD (gastroesophageal reflux disease)    "I take heart burn medicine"  . Glaucoma   . Hemorrhoids 03/09/2017  . Hyperlipemia   . Hypertension   . Low back pain 12/29/2016  . Mass of upper lobe of left lung 12/29/2016  . PVD (peripheral vascular disease) ([place])    a. Mild plaque of iliacs in 2013 on duplex; PET 2018:  PET also corroborated coronary, aortic arch, and branch vessel atherosclerotic vascular disease as well as aortoiliac atherosclerotic vascular disease.  . Sleep apnea    has not gotten CPAP yet     Past Surgical History:  Procedure Laterality Date  . APPENDECTOMY    . COLONOSCOPY    . EYE SURGERY    . FLEXIBLE BRONCHOSCOPY N/A 02/01/2017   Procedure: FLEXIBLE BRONCHOSCOPY;  Surgeon: Gaye Pollack, MD;  Location: [facility] OR;  Service: Thoracic;  Laterality: N/A;  . GANGLION CYST EXCISION     left hand  . HERNIA REPAIR     double hernia repair  . THORACOTOMY/LOBECTOMY Left 02/01/2017   Procedure: THORACOTOMY/LEFT UPPER LOBECTOMY;  Surgeon: Gaye Pollack, MD;  Location: [facility] OR;  Service: Thoracic;  Laterality: Left;  . TUMOR REMOVAL     non cancer tumor from neck    Allergies  Allergen Reactions  . Colchicine Diarrhea    I have reviewed the patient's current medications . apixaban  5 mg Oral BID  . atorvastatin  40 mg Oral QPM  . diltiazem  180 mg Oral Daily  . furosemide  80 mg Intravenous BID  . gabapentin  600 mg Oral TID  . hydrocortisone   Rectal TID  . LORazepam  0.5 mg Oral QHS  . mouth rinse  15 mL Mouth Rinse BID  . pantoprazole  40 mg Oral Daily  . potassium chloride (KCL MULTIRUN) 30 mEq in 265 mL IVPB  30 mEq Intravenous Once  . potassium chloride  40 mEq Oral Daily  . sertraline  100 mg Oral Daily  . sodium chloride flush  3 mL Intravenous Q12H  . tamsulosin  0.4 mg Oral QPC supper  . [START ON  03/02/2017] Vitamin D (Ergocalciferol)  50,000 Units Oral Q7 days    sodium chloride, acetaminophen, hydrALAZINE, ondansetron (ZOFRAN) IV, sodium chloride flush  Prior to Admission medications   Medication Sig Start Date End Date Taking? Authorizing Provider  apixaban (ELIQUIS) 5 MG TABS tablet Take 1 tablet (5 mg total) by mouth 2 (two) times daily. 03/01/17  Yes Imogene Burn, PA-C  atorvastatin (LIPITOR) 40 MG tablet Take 1 tablet (40 mg total) by mouth every evening. 03/01/17  Yes Imogene Burn, PA-C  cyanocobalamin (,VITAMIN B-12,) 1000 MCG/ML injection Inject 1 mL (1,000 mcg total) into the skin every 30 (thirty) days. Please go to your [place] MD's office for this injection 02/17/17  Yes Shanker Kristeen Mans, MD  diltiazem (CARDIZEM CD) 180 MG 24 hr capsule Take 1 capsule (180 mg total) by mouth daily. 03/01/17  Yes Imogene Burn, PA-C  furosemide (LASIX) 40 MG tablet Take 1 tablet (40 mg total) by mouth daily. 03/01/17  Yes Imogene Burn, PA-C  gabapentin (NEURONTIN) 300 MG capsule Take 2 capsules (600 mg total) by mouth 3 (three) times daily. 02/17/17  Yes Shanker Kristeen Mans, MD  hydrocortisone (ANUSOL-HC) 2.5 % rectal cream Place rectally 3 (three) times daily. For 5 days only. 02/17/17  Yes Shanker Kristeen Mans, MD  omeprazole (PRILOSEC) 20 MG capsule Take 1 capsule (20 mg total) by mouth every evening. 02/17/17  Yes Shanker Kristeen Mans, MD  Potassium Chloride ER 20 MEQ TBCR Take 20 mEq by mouth daily. 03/01/17  Yes Imogene Burn, PA-C  sertraline (ZOLOFT) 100 MG tablet Take 1 tablet (100 mg total) by mouth daily. 02/17/17  Yes Shanker Kristeen Mans, MD  tamsulosin (FLOMAX) 0.4 MG CAPS capsule Take 1 capsule (0.4 mg total) by mouth daily after supper. 02/17/17  Yes Shanker Kristeen Mans, MD  Vitamin D, Ergocalciferol, (DRISDOL) 50000 units CAPS capsule Take 1 capsule (50,000 Units total) by mouth every 7 (seven) days. Every Friday 02/17/17  Yes Shanker Kristeen Mans, MD     Social History   Social History  .  Marital status: Single    Spouse name: N/A  . Number of children: N/A  . Years of education: N/A   Occupational History  . Not on file.   Social History Main Topics  . Smoking status: Former Research scientist (life sciences)  . Smokeless tobacco: Never Used     Comment: QUIT SMOKING IN THE LATE 90'S  . Alcohol use No  . Drug  use: No  . Sexual activity: Not on file   Other Topics Concern  . Not on file   Social History Narrative  . No narrative on file    Family Status  Relation Status  . Father Deceased  . Mother Deceased  . Sister Deceased  . Maternal Grandmother Deceased  . Maternal Grandfather Deceased  . Paternal Grandmother Deceased  . Paternal Grandfather Deceased  . Sister Alive  . Brother    Family History  Problem Relation Age of Onset  . Colon cancer Father   . Hypertension Father   . Hypertension Mother   . Colon cancer Sister   . Colon cancer Brother      ROS:  Full 14 point review of systems complete and found to be negative unless listed above.  Physical Exam: Blood pressure 132/87, pulse (!) 112, temperature 97.9 F (36.6 C), temperature source Oral, resp. rate (!) 28, height _0  (1.702 m), weight 182 lb 8 oz (82.8 kg), SpO2 93 %.  General: Well developed, well nourished, male in no acute distress Head: Eyes PERRLA, No xanthomas.   Normocephalic and atraumatic, oropharynx without edema or exudate. Dentition: Poor Lungs:  Heart: HRRR S1 S2, no rub/gallop, Heart irregular rate and rhythm with S1, S2  murmur. pulses are 2+ all 4 extrem.   Neck: No carotid bruits. No lymphadenopathy.  JVP 12+ cm Abdomen: Bowel sounds present, abdomen soft and non-tender without masses or hernias noted. Msk:  No spine or cva tenderness. No weakness, no joint deformities or effusions. Extremities: No clubbing or cyanosis. No edema.  Neuro: Alert and oriented X 3. No focal deficits noted. Psych:  Good affect, responds appropriately Skin: No rashes or lesions noted.  Labs:   Lab Results    Component Value Date   WBC 6.9 03/13/2017   HGB 10.1 (L) 03/13/2017   HCT 31.3 (L) 03/13/2017   MCV 89.2 03/13/2017   PLT 300 03/13/2017   No results for input(s): INR in the last 72 hours.  Recent Labs Lab 03/09/17 1207  03/14/17 0510  NA 138  < > 133*  K 3.9  < > 2.8*  CL  --   < > 87*  CO2 29  < > 35*  BUN 12.5  < > 16  CREATININE 1.0  < > 1.02  CALCIUM 9.6  < > 8.8*  PROT 7.3  --   --   BILITOT 0.86  --   --   ALKPHOS 90  --   --   ALT 13  --   --   AST 15  --   --   GLUCOSE 111  < > 110*  ALBUMIN 2.9*  --   --   < > = values in this interval not displayed. Magnesium  Date Value Ref Range Status  03/13/2017 2.2 1.7 - 2.4 mg/dL Final   No results for input(s): CKTOTAL, CKMB, TROPONINI in the last 72 hours.  Recent Labs  03/12/17 1345  TROPIPOC 0.01   B Natriuretic Peptide  Date/Time Value Ref Range Status  03/12/2017 01:17 PM 609.6 (H) 0.0 - 100.0 pg/mL Final  02/15/2017 03:45 AM 984.2 (H) 0.0 - 100.0 pg/mL Final   TSH  Date/Time Value Ref Range Status  02/09/2017 12:16 PM 0.959 0.350 - 4.500 uIU/mL Final    Comment:    Performed by a 3rd Generation assay with a functional sensitivity of <=0.01 uIU/mL.    Echo:  02/16/17 Study Conclusions - Left ventricle: The cavity size  was normal. Wall thickness was increased in a pattern of mild LVH. Systolic function was normal. The estimated ejection fraction was in the range of 60% to 65%. Indeterminant diastolic function. Wall motion was normal; there were no regional wall motion abnormalities. - Aortic valve: There was no stenosis. - Mitral valve: There was trivial regurgitation. - Left atrium: The atrium was moderately dilated. - Right ventricle: The cavity size was normal. Systolic function was normal. - Right atrium: The atrium was mildly dilated. - Tricuspid valve: Peak RV-RA gradient (S): 30 mm Hg. - Pulmonary arteries: PA peak pressure: 33 mm Hg (S). - Inferior vena cava: The vessel was  normal in size. The respirophasic diameter changes were in the normal range (>= 50%), consistent with normal central venous pressure. - Pericardium, extracardiac: A trivial pericardial effusion was identified posterior to the heart. Impressions: - Normal LV size with mild LV hypertrophy. EF 60-65%. Normal RV size and systolic function. Biatrial enlargement. No significant valvular abnormalities.   ECG:  03/18 Coarse atrial fib with rapid ventricular response   Cath:   Radiology:  Dg Chest 2 View Result Date: 03/13/2017 CLINICAL DATA:  Shortness of breath, improved.  Ex-smoker. EXAM: CHEST  2 VIEW COMPARISON:  03/12/2017. FINDINGS: Increased airspace opacity throughout the left lung and in the right lower lung zone. Mildly progressive prominence of the interstitial markings. Moderately large hiatal hernia. Mediastinal surgical clips. The airspace opacity on the left is obscuring the heart borders. Right diaphragmatic eventration. Diffuse osteopenia. IMPRESSION: 1. Progressive changes of congestive heart failure. 2. Moderately large hiatal hernia. Electronically Signed   By: Claudie Revering M.D.   On: 03/13/2017 10:51   Dg Chest 2 View Result Date: 03/12/2017 CLINICAL DATA:  Low O2 sats. Prior left upper lobectomy. Shortness of breath. EXAM: CHEST  2 VIEW COMPARISON:  02/24/2017 FINDINGS: Postoperative changes in the left upper lung. Diffuse airspace disease and interstitial prominence throughout both lungs. This could represent edema or infection. Mild cardiomegaly. Large hiatal hernia. No effusions. IMPRESSION: Diffuse interstitial and alveolar opacities throughout the lungs which could reflect edema or infection. Large hiatal hernia. Electronically Signed   By: Rolm Baptise M.D.   On: 03/12/2017 12:27   Dg Chest Port 1v Same Day Result Date: 03/14/2017 CLINICAL DATA:  Dyspnea.  Previous left lobectomy for lung cancer. EXAM: PORTABLE CHEST 1 VIEW COMPARISON:  03/13/2017 and  03/12/2017 as well as 02/15/2017 FINDINGS: Lungs are somewhat hypoinflated and demonstrate postsurgical changes of the left lung/ hemithorax compatible previous lobectomy for lung cancer. There is mild overall worsening a path hazy opacification over the left lung as well as slight worsening mixed interstitial airspace density over the right upper lobe as cannot exclude developing infection. No definite effusion. Remainder the exam is unchanged. IMPRESSION: Slight interval worsening opacification over the left lung on background of postsurgical change as well as mild worsen mixed interstitial airspace density over the right upper lobe as cannot exclude developing infection. Electronically Signed   By: Marin Olp M.D.   On: 03/14/2017 08:38    ASSESSMENT AND PLAN:   The patient was seen today by Dr. Aundra Dubin, the patient evaluated and the data reviewed.  Principal Problem: 1.  Acute respiratory failure ([place]) - He has not yet fully recovered from lung surgery on 02/01/2017. - He was in heart failure on admission, his chest x-rays show progression of this over his stay.  - The chest x-ray reports also mention that they cannot exclude developing infection. - Continue diuresis, management of  other issues per IM - Consider antibiotics, not sure if this would be CAP or HCAP  2.  Acute on chronic diastolic congestive heart failure ([facility]) - Dry weight is unclear. He obviously needs to be diuresed more. - Will review chest x-ray with M.D. to see if decubitus film would help. - Will recheck BNP  3. Persistent atrial fibrillation with rapid ventricular response ([place]) - Patient has not had palpitations despite being in atrial fibrillation. - His last ECG that was sinus rhythm was 01/22/2017. All others since then have been atrial fib or atrial flutter. - His rhythm now seems to be atrial fib.  - He has been anticoagulated with Eliquis, but only since 03/01/2017 - It may be that the atrial fib/flutter is  contributing to his CHF. - We may need to consider restoring sinus rhythm. - His left atrium was moderately dilated, his right atrium was mildly dilated at his echo 02/16/2017  - M.D. advise if repeat is needed  4. Hypokalemia - Secondary to diuresis - Supplement aggressively - Discussed with M.D. if IV supplement is needed.  Otherwise, per IM Active Problems:   Carotid artery disease ([place])   Essential hypertension   Hyperlipidemia   OSA (obstructive sleep apnea)   Lung cancer ([facility])   Elevated brain natriuretic peptide (BNP) level   Anemia   Acute respiratory distress   Signed: Lenoard Aden 03/14/2017 9:17 AM Beeper [phone]  Co-Sign MD  Patient seen with PA, agree with the above note.  1. Atrial fibrillation: Persistent, with RVR.  This seems to date from just after his lung surgery (suspect triggering factor was lung surgery).  He has been in atrial fibrillation for over a month now.  Rate is still poorly controlled and he has had worsened diastolic CHF.  I think he needs to get back into NSR.  - Continue apixaban 5 mg bid.   - Needs DCCV.  He has only been on apixaban for about 10 days, so will need TEE prior to rule out thrombus.  Will arrange for tomorrow.  I discussed risks/benefits of procedure with patient and he agrees to proceed.  - rate still not well-controlled. I will hold po diltiazem and use diltiazem gtt for now.  2. Acute on chronic diastolic CHF: Echo in 4/54 with EF 60-65%, normal RV.  CHF exacerbation likely triggered by uncontrolled atrial fibrillation.  He is volume overloaded, needs further diuresis.  Seems to be diuresing well so far on Lasix 80 mg IV bid.  - Continue Lasix 80 mg po bid, replace K.  3. PNA: He is on vancomycin/cefepime per primary team for HCAP.  4. COPD: On 3 L home oxygen.  5. h/o lung cancer: S/p recent lobectomy.   Loralie Champagne 03/14/2017 2:12 PM

## 2017-03-16 NOTE — CV Procedure (Addendum)
    PROCEDURE NOTE:  Procedure:  Transesophageal echocardiogram Operator:  Fransico Him, MD Indications:  Atrial fibrillation Complications: None  During this procedure the patient is administered a total of '200mg'$  Propofol to achieve and maintain sedation.  The patient's heart rate, blood pressure, and oxygen saturation are monitored continuously during the procedure by anesthesia.  The patient became very hypoxic shortly after TEE probe insertion with O2 sats of 60%.  The TEE probe was removed and patient was bagged by anesthesia with O2 sats up in to the low 90's.  After consulting with Dr. Irish Lack and Dr. Conrad Eagle, it was felt that patient needed to be cardioverted if possible for improvement in respiratory status.  A re-attempt at sedation was performed with plan for intubation if needed.  The TEE probe was reinserted and patient maintained adequate saturations.  A limited TEE was done to visualize the RA and LA as well as LAA and results are below.  Definity contrast was used to better visualize the appendage.  Results: Normal LV size and function Normal RV size and function Mild dilated RA  Moderately dilated LA with mild spontaneous echo contrast.  There was no evidence of thrombus in the LA or LAA by definity contrast.  The emptying velocity was 40 cm/sec  The patient tolerated the procedure well and went on to DCCV  Signed: Fransico Him, MD [place]     Electrical Cardioversion Procedure Note Paul Valdez [id number] [birth year]  Procedure: Electrical Cardioversion Indications:  Atrial Fibrillation  Time Out: Verified patient identification, verified procedure,medications/allergies/relevent history reviewed, required imaging and test results available.  Performed  Procedure Details  The patient was NPO after midnight. Anesthesia was administered at the beside  by [facility]: Lambert Rhonda W.  1Cardioversion was done with synchronized biphasic defibrillation with AP pads with 150watts.   The patient converted to normal sinus rhythm. The patient tolerated the procedure well   IMPRESSION:  Successful cardioversion of atrial fibrillation    Traci Turner 03/16/2017, 9:46 AM

## 2017-03-16 NOTE — Interval H&P Note (Signed)
History and Physical Interval Note:  03/16/2017 9:45 AM  Paul Valdez  has presented today for surgery, with the diagnosis of afib  The various methods of treatment have been discussed with the patient and family. After consideration of risks, benefits and other options for treatment, the patient has consented to  Procedure(s): CARDIOVERSION (N/A) TRANSESOPHAGEAL ECHOCARDIOGRAM (TEE) (N/A) as a surgical intervention .  The patient's history has been reviewed, patient examined, no change in status, stable for surgery.  I have reviewed the patient's chart and labs.  Questions were answered to the patient's satisfaction.     Fransico Him

## 2017-03-16 NOTE — Anesthesia Procedure Notes (Signed)
Procedure Name: MAC Date/Time: 03/16/2017 10:20 AM Performed by: Everlean Cherry A Pre-anesthesia Checklist: Patient identified, Emergency Drugs available, Suction available and Patient being monitored Patient Re-evaluated:Patient Re-evaluated prior to inductionOxygen Delivery Method: Simple face mask

## 2017-03-16 NOTE — Progress Notes (Signed)
Progress Note  Patient Name: Paul Valdez Date of Encounter: 03/16/2017  Primary Cardiologist: Ryegate better. Scheduled for DCCV today.  Inpatient Medications    Scheduled Meds: . apixaban  5 mg Oral BID  . atorvastatin  40 mg Oral QPM  . ceFEPime (MAXIPIME) IV  1 g Intravenous Q8H  . furosemide  80 mg Intravenous BID  . gabapentin  600 mg Oral TID  . hydrocortisone   Rectal TID  . levalbuterol  0.63 mg Nebulization TID  . LORazepam  0.5 mg Oral QHS  . mouth rinse  15 mL Mouth Rinse BID  . pantoprazole  40 mg Oral Daily  . potassium chloride (KCL MULTIRUN) 30 mEq in 265 mL IVPB  30 mEq Intravenous Once  . potassium chloride  40 mEq Oral BID  . sertraline  100 mg Oral Daily  . sodium chloride flush  3 mL Intravenous Q12H  . tamsulosin  0.4 mg Oral QPC supper  . vancomycin  1,000 mg Intravenous Q12H  . [START ON 03/22/2017] Vitamin D (Ergocalciferol)  50,000 Units Oral Q7 days   Continuous Infusions: . sodium chloride 20 mL/hr at 03/15/17 2251  . diltiazem (CARDIZEM) infusion 5 mg/hr (03/16/17 0031)   PRN Meds: sodium chloride, acetaminophen, guaiFENesin-dextromethorphan, hydrALAZINE, levalbuterol, ondansetron (ZOFRAN) IV, sodium chloride flush   Vital Signs    Vitals:   03/15/17 1634 03/15/17 1937 03/15/17 2130 03/16/17 0556  BP:  111/68  138/76  Pulse:  63 90 94  Resp:  '18 18 18  '$ Temp:  98.1 F (36.7 C)  98.1 F (36.7 C)  TempSrc:  Oral  Oral  SpO2: 92% 93% 92% 92%  Weight:    183 lb 9.6 oz (83.3 kg)  Height:        Intake/Output Summary (Last 24 hours) at 03/16/17 0921 Last data filed at 03/16/17 0920  Gross per 24 hour  Intake          1401.17 ml  Output             3150 ml  Net         -1748.83 ml   Filed Weights   03/14/17 0437 03/15/17 0534 03/16/17 0556  Weight: 182 lb 8 oz (82.8 kg) 184 lb 4.8 oz (83.6 kg) 183 lb 9.6 oz (83.3 kg)    Telemetry    AFib, variable rate control - Personally Reviewed  ECG    AFib  with RVR on 3/18 - Personally Reviewed  Physical Exam   GEN: No acute distress.   Neck: No JVD Cardiac: irregularly irregular, no murmurs, rubs, or gallops.  Respiratory: Clear to auscultation bilaterally. GI: Soft, nontender, non-distended  MS: No edema; No deformity. Neuro:  Nonfocal  Psych: Normal affect   Labs    Chemistry Recent Labs Lab 03/09/17 1207  03/14/17 0510 03/14/17 1127 03/15/17 0458 03/16/17 0342  NA 138  < > 133*  --  133* 134*  K 3.9  < > 2.8* 3.3* 3.8 3.4*  CL  --   < > 87*  --  90* 87*  CO2 29  < > 35*  --  32 35*  GLUCOSE 111  < > 110*  --  105* 115*  BUN 12.5  < > 16  --  18 16  CREATININE 1.0  < > 1.02  --  1.00 0.99  CALCIUM 9.6  < > 8.8*  --  8.7* 8.7*  PROT 7.3  --   --   --   --   --  ALBUMIN 2.9*  --   --   --   --   --   AST 15  --   --   --   --   --   ALT 13  --   --   --   --   --   ALKPHOS 90  --   --   --   --   --   BILITOT 0.86  --   --   --   --   --   GFRNONAA  --   < > >60  --  >60 >60  GFRAA  --   < > >60  --  >60 >60  ANIONGAP 11  < > 11  --  11 12  < > = values in this interval not displayed.   Hematology  Recent Labs Lab 03/13/17 0503 03/15/17 0458 03/16/17 0342  WBC 6.9 8.3 8.2  RBC 3.51* 3.75* 3.76*  HGB 10.1* 10.8* 10.5*  HCT 31.3* 33.1* 33.1*  MCV 89.2 88.3 88.0  MCH 28.8 28.8 27.9  MCHC 32.3 32.6 31.7  RDW 15.7* 15.5 15.0  PLT 300 388 422*    Cardiac Enzymes  Recent Labs Lab 03/14/17 0820  TROPONINI 0.04*     Recent Labs Lab 03/12/17 1345  TROPIPOC 0.01     BNP  Recent Labs Lab 03/12/17 1317  BNP 609.6*     DDimer   Recent Labs Lab 03/14/17 0820  DDIMER 3.48*     Radiology    Ct Angio Chest Pe W Or Wo Contrast  Result Date: 03/14/2017 CLINICAL DATA:  71 year old male with hypoxia and shortness of breath. History of AFib. History of known cancer and left upper lobectomy. EXAM: CT ANGIOGRAPHY CHEST WITH CONTRAST TECHNIQUE: Multidetector CT imaging of the chest was performed  using the standard protocol during bolus administration of intravenous contrast. Multiplanar CT image reconstructions and MIPs were obtained to evaluate the vascular anatomy. CONTRAST:  100 cc Isovue 370 COMPARISON:  Chest CT dated 02/15/2017 FINDINGS: Cardiovascular: There is mild cardiomegaly. There is dilatation of the atria. There is reflux of contrast from the right atrium into the IVC consistent with a degree of right cardiac dysfunction. Correlation with echocardiogram recommended. There is no pericardial effusion. There is mild atherosclerotic calcification of the thoracic aorta. The aorta is otherwise unremarkable for the degree of enhancement. There is no CT evidence of pulmonary embolus. Mediastinum/Nodes: Mildly enlarged right hilar lymph nodes measuring 15 mm in short axis. There is no mediastinal adenopathy. There is a moderate size hiatal hernia containing portion of the stomach. The esophagus is grossly unremarkable. Lungs/Pleura: There is postsurgical changes are left upper lobectomy. Persistent small left pleural effusion versus pleural thickening similar to prior CT. There is background of chronic parenchymal disease with subpleural blebs primarily in the right upper and right lower lobe. There is diffuse ground-glass density throughout the lungs with patchy areas of airspace opacity in the right upper lobe and superior portion of the left lung as well as bilateral patchy airspace disease involving the lung bases which are new compared to prior study and most consistent with superimposed pneumonia. Bilateral peribronchial thickening noted. There is no pneumothorax. The central airways are patent. Upper Abdomen: Moderate size hiatal hernia. Nonobstructing bilateral renal calculi measuring up to 6 mm in the interpolar aspect of the left kidney. Colonic diverticulosis. Moderate size hiatal hernia. Musculoskeletal: No chest wall abnormality. No acute or significant osseous findings. Review of the MIP  images confirms the above  findings. IMPRESSION: 1. No CT evidence of pulmonary embolism. 2. Diffuse patchy airspace disease, new from prior study and most compatible with pneumonia. Clinical correlation and follow-up to resolution recommended. There is a background of chronic lung disease. 3. Postsurgical changes of left upper lobectomy with persistent small left pleural effusion versus pleural thickening. 4. Mild cardiomegaly with evidence of right cardiac dysfunction. Correlation with echocardiogram recommended. Electronically Signed   By: Anner Crete M.D.   On: 03/14/2017 18:10    Cardiac Studies   EF 60-65% in 2/18  Patient Profile     71 y.o. male with AFib with RVR in the setting of pneumonia  Assessment & Plan    Plan for DCCV today.  COntinue anticoagulation.  Has diuresed well to treat acute on chronic diastolic heart failure.  Appears euvolemic.  Anticoagulated.  DOE: Likely multifactorial.  s/p lung surgery.  May need PT post cardioversion to help with ambulation.  ABx for pneumonia.  Liklely trigger for increased rates.  Per primary team.  COPD: COntinue oxygen. Wears 3L/min at home.    Signed, Larae Grooms, MD  03/16/2017, 9:21 AM

## 2017-03-16 NOTE — Consult Note (Signed)
ELECTROPHYSIOLOGY CONSULT NOTE    Primary Care Physician: Gara Kroner, MD Referring Physician:  Dr Irish Lack  Admit Date: 03/12/2017  Reason for consultation:  afib managment  Paul Valdez is a 71 y.o. male with a h/o chronic lung disease on home O2 and lung Ca s/p recent surgery who is admitted with CHF and afib.   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   He developed post operative afib for which he was placed on eliquis and cardizem.  He was admitted 02/15/17 with pneumonia.  He continues to have slow decline at home.  This admission, he has had further difficulty with afib with RVR.  The patient underwent cardioversion earlier today.  Though he is in sinus, he is having frequent salvos of afib.    Presently, he is sleeping and unable to provide additional history.  Past Medical History:  Diagnosis Date  . Acute respiratory failure ([place])   . Arthritis   . Asthma   . Atrial fibrillation ([place])   . Atypical chest pain    a. Normal nuc 2014.  . Carotid artery disease ([place])    a. Carotid duplex 2015: 59-93% RICA, 5-70% LICA.   Marland Kitchen Cataract   . CHF (congestive heart failure) ([place])   . Coronary artery calcification seen on CT scan   . Depression   . Detached retina   . Former consumption of alcohol   . Former tobacco use   . GERD (gastroesophageal reflux disease)    "I take heart burn medicine"  . Glaucoma   . Hemorrhoids 03/09/2017  . Hyperlipemia   . Hypertension   . Low back pain 12/29/2016  . Mass of upper lobe of left lung 12/29/2016  . PVD (peripheral vascular disease) ([place])    a. Mild plaque of iliacs in 2013 on duplex; PET 2018:  PET also corroborated coronary, aortic arch, and branch vessel atherosclerotic vascular disease as well as aortoiliac atherosclerotic vascular disease.  . Sleep apnea    has not gotten CPAP yet   Past Surgical History:  Procedure Laterality Date  . APPENDECTOMY    . COLONOSCOPY    . EYE  SURGERY    . FLEXIBLE BRONCHOSCOPY N/A 02/01/2017   Procedure: FLEXIBLE BRONCHOSCOPY;  Surgeon: Gaye Pollack, MD;  Location: [facility] OR;  Service: Thoracic;  Laterality: N/A;  . GANGLION CYST EXCISION     left hand  . HERNIA REPAIR     double hernia repair  . THORACOTOMY/LOBECTOMY Left 02/01/2017   Procedure: THORACOTOMY/LEFT UPPER LOBECTOMY;  Surgeon: Gaye Pollack, MD;  Location: [facility] OR;  Service: Thoracic;  Laterality: Left;  . TUMOR REMOVAL     non cancer tumor from neck    . apixaban  5 mg Oral BID  . atorvastatin  40 mg Oral QPM  . ceFEPime (MAXIPIME) IV  1 g Intravenous Q8H  . furosemide  80 mg Intravenous BID  . gabapentin  600 mg Oral TID  . hydrocortisone   Rectal TID  . levalbuterol  0.63 mg Nebulization TID  . LORazepam  0.5 mg Oral QHS  . mouth rinse  15 mL Mouth Rinse BID  . pantoprazole  40 mg Oral Daily  . potassium chloride (KCL MULTIRUN) 30 mEq in 265 mL IVPB  30 mEq Intravenous Once  . potassium chloride  40 mEq Oral BID  . sertraline  100 mg Oral Daily  . sodium chloride flush  3 mL Intravenous Q12H  .  tamsulosin  0.4 mg Oral QPC supper  . vancomycin  1,000 mg Intravenous Q12H  . [START ON 03/01/2017] Vitamin D (Ergocalciferol)  50,000 Units Oral Q7 days   . diltiazem (CARDIZEM) infusion 5 mg/hr (03/16/17 1012)    Allergies  Allergen Reactions  . Colchicine Diarrhea    Social History   Social History  . Marital status: Single    Spouse name: N/A  . Number of children: N/A  . Years of education: N/A   Occupational History  . Not on file.   Social History Main Topics  . Smoking status: Former Research scientist (life sciences)  . Smokeless tobacco: Never Used     Comment: QUIT SMOKING IN THE LATE 90'S  . Alcohol use No  . Drug use: No  . Sexual activity: Not on file   Other Topics Concern  . Not on file   Social History Narrative  . No narrative on file    Family History  Problem Relation Age of Onset  . Colon cancer Father   . Hypertension Father   . Hypertension  Mother   . Colon cancer Sister   . Colon cancer Brother     ROS- sleeping and unable to obtain  Physical Exam: Telemetry: personally reviewed,  afib with RVR now with sinus but frequent salvos of afib Vitals:   03/16/17 1120 03/16/17 1130 03/16/17 1140 03/16/17 1157  BP: 119/83 130/63 133/80 (!) 150/84  Pulse: (!) 43 84 79 74  Resp: _0 Temp:      TempSrc:      SpO2: 95% 95% 93% 96%  Weight:      Height:        GEN- The patient is ill appearing, sleeping  Head- normocephalic, atraumatic Eyes-  Sclera clear, conjunctiva pink Ears- hearing intact Oropharynx- clear Neck- supple,   Lungs- Coarse BS Heart- RRR with frequent ectopy GI- soft, NT, ND, + BS Extremities- no clubbing, cyanosis, + depedant edema MS- diffusce muscle atrophy Skin- no rash or lesion Psych- sleeping Neuro- sleeping  EKG- multiple ekgs personally reviewed in epic  Labs:   Lab Results  Component Value Date   WBC 8.2 03/16/2017   HGB 10.5 (L) 03/16/2017   HCT 33.1 (L) 03/16/2017   MCV 88.0 03/16/2017   PLT 422 (H) 03/16/2017    Recent Labs Lab 03/16/17 0342  NA 134*  K 3.4*  CL 87*  CO2 35*  BUN 16  CREATININE 0.99  CALCIUM 8.7*  GLUCOSE 115*   Lab Results  Component Value Date   TROPONINI 0.04 (HH) 03/14/2017   No results found for: CHOL No results found for: HDL No results found for: LDLCALC No results found for: TRIG No results found for: CHOLHDL No results found for: LDLDIRECT     Echo:  reviewed  ASSESSMENT AND PLAN:   1. Persistent afib The patient has persistent afib in the setting of recent medical illness and lung surgery.  I am hopeful that as he continues to recover that his afib may also improve.  He is currently quite ill.  Given lung disease, I would not favor amiodarone.  Given frequent issues with low K, I also worry about tikosyn and sotalol.  For the short term, I think that flecainide may be his best option.  I will therefore start flecainide 151m  BID.  Continue eliquis.  Convert IV diltiazem to PO and titrate as required. He has close followup with Dr VBeau FannyHe had normal nuclear study 2014.  LV  wall thickness < 1.5cm on echo.  I think that flecainide is a reasonable option for him.  2. HTN Stable No change required today  Very complicated and ill patient.  He has limited options in the setting of severe lung disease.  I have discussed above with Reino Bellis.  A high level of decision making was required for this encounter.  General cardiology to manage I am happy to see when needed going forward.    Thompson Grayer, MD 03/16/2017  4:06 PM

## 2017-03-16 NOTE — Anesthesia Postprocedure Evaluation (Signed)
Anesthesia Post Note  Patient: MICHAIL BOYTE  Procedure(s) Performed: Procedure(s) (LRB): CARDIOVERSION (N/A) TRANSESOPHAGEAL ECHOCARDIOGRAM (TEE) (N/A)  Patient location during evaluation: PACU Anesthesia Type: General Level of consciousness: awake and alert Pain management: pain level controlled Vital Signs Assessment: post-procedure vital signs reviewed and stable Respiratory status: spontaneous breathing, nonlabored ventilation, respiratory function stable and patient connected to nasal cannula oxygen Cardiovascular status: blood pressure returned to baseline and stable Postop Assessment: no signs of nausea or vomiting Anesthetic complications: no       Last Vitals:  Vitals:   03/16/17 1140 03/16/17 1157  BP: 133/80 (!) 150/84  Pulse: 79 74  Resp: 17 18  Temp:      Last Pain:  Vitals:   03/16/17 1119  TempSrc: Oral  PainSc:                  Jorel Gravlin DAVID

## 2017-03-16 NOTE — Anesthesia Preprocedure Evaluation (Signed)
Anesthesia Evaluation  Patient identified by MRN, date of birth, ID band Patient awake    Reviewed: Allergy & Precautions, NPO status , Patient's Chart, lab work & pertinent test results  Airway Mallampati: I  TM Distance: >3 FB Neck ROM: Full    Dental   Pulmonary former smoker,  H/O Lung Cancer   Pulmonary exam normal        Cardiovascular hypertension, Pt. on medications + CAD  Normal cardiovascular exam     Neuro/Psych    GI/Hepatic GERD  Medicated and Controlled,  Endo/Other    Renal/GU      Musculoskeletal   Abdominal   Peds  Hematology   Anesthesia Other Findings   Reproductive/Obstetrics                             Anesthesia Physical Anesthesia Plan  ASA: III  Anesthesia Plan: General   Post-op Pain Management:    Induction: Intravenous  Airway Management Planned: Mask  Additional Equipment:   Intra-op Plan:   Post-operative Plan: Extubation in OR  Informed Consent: I have reviewed the patients History and Physical, chart, labs and discussed the procedure including the risks, benefits and alternatives for the proposed anesthesia with the patient or authorized representative who has indicated his/her understanding and acceptance.     Plan Discussed with: CRNA and Surgeon  Anesthesia Plan Comments:         Anesthesia Quick Evaluation

## 2017-03-16 NOTE — Transfer of Care (Signed)
Immediate Anesthesia Transfer of Care Note  Patient: Paul Valdez  Procedure(s) Performed: Procedure(s): CARDIOVERSION (N/A) TRANSESOPHAGEAL ECHOCARDIOGRAM (TEE) (N/A)  Patient Location: Endoscopy Unit  Anesthesia Type:MAC  Level of Consciousness: awake, alert , oriented and patient cooperative  Airway & Oxygen Therapy: Patient Spontanous Breathing and Patient connected to face mask oxygen  Post-op Assessment: Report given to RN, Post -op Vital signs reviewed and stable and Patient moving all extremities X 4  Post vital signs: Reviewed and stable  Last Vitals:  Vitals:   03/16/17 0930 03/16/17 1119  BP: (!) 161/89   Pulse: 96 79  Resp: (!) 24 16  Temp: 36.3 C     Last Pain:  Vitals:   03/16/17 0930  TempSrc: Oral  PainSc:       Patients Stated Pain Goal: 0 (19/14/78 2956)  Complications: No apparent anesthesia complications

## 2017-03-16 NOTE — Progress Notes (Signed)
[facility]                                                                              Patient Demographics  Paul Valdez, is a 71 y.o. male, DOB - 25-Sep-1946, OAC:[id number]  Admit date - 03/12/2017   Admitting Physician Elwin Mocha, MD  Outpatient Primary MD for the patient is Gara Kroner, MD  Outpatient specialists:   LOS - 3  days    Chief Complaint  Patient presents with  . Shortness of Breath       Brief summary   Patient is a 71 year old male with hypertension, hyperlipidemia, CAD, COPD, A. fib, mass in his upper lobe of left lung status post lobectomy presented with shortness of breath. Patient had upper lobe lobectomy in 2/18 secondary to squamous cell carcinoma, subsequently was readmitted again with HCAP  was DC'd on 3 L of O2. He was also discharged home on Lasix 40 mg daily. Per patient he was noticed to have hypoxia with O2 sats of 65% by his home health nurse and was recommended to come to the ER. Otherwise he denied any chest pain, palpitations, coughing or any lower extremity edema.He reports compliance with his oxygen at home but at the time of admission is wondering if perhaps the machine is "malfunctioning". He reports mild intermittent nonproductive cough In the ER, O2 sats were 88% on 3 L and was given Lasix intravenously and admitted for further workup  Assessment & Plan    Principal Problem: Acute respiratory failure with hypoxia likely related to acute on chronic diastolic heart failure, recent left upper lobe lobectomy. -  Chest x-ray on admission with diffuse interstitial and alvolar opacities throughout the lungs which could reflect edema or infection. BNP 609. Patient was placed on IV diuresis. - On 3/18, patient was noticed to be desatting, was placed on 6 L, in A. fib with RVR. Chest x-ray also showed possible pneumonia. CT chest showed no PE but diffuse patchy airspace disease, new, compatible with pneumonia, was placed on  vancomycin and cefepime  - Placed on Xopenex nebs for wheezing, will need home O2 evaluation prior to DC   Active problems  Acute on chronic diastolic heart failure. Echo done in February 2018 reveals EF of 60% mild LVH. Patient was discharged on Lasix 40 mg daily  - Continue IV diuresis currently on 80 mg IV BID,  - Continue strict I's and O's and daily weights, negative balance of 7.2 L - Weight down from 194 on admission to 183 today (was 189 lbs on the day of discharge on 2/19) - Likely will need higher dose of Lasix at the time of discharge. - Per cardiology, likely atrial fibrillation is contributing to CHF and need to restore sinus rhythm. Cardioversion planned today  Atrial fibrillation with RVR -  chadvasc score 5. continue eliquis - Cardioversion planned today  Hypertension. -Continue home meds  Anemia. Likely related to chronic disease. Hemoglobin 9.9 on admission. Chart review indicates this is close to his baseline.    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  - Patient following Dr. Julien Nordmann and Dr  Bartle   Dyslipidemia  continue statin   Code Status: full  DVT Prophylaxis:  SCD's Family Communication: Discussed in detail with the patient, all imaging results, lab results explained to the patient   Disposition Plan:  Time Spent in minutes   35 minutes  Procedures:  Cardioversion 3/20  Consultants:   Cardiology  Antimicrobials:   IV vancomycin 3/18  IV cefepime 3/18   Medications  Scheduled Meds: . apixaban  5 mg Oral BID  . atorvastatin  40 mg Oral QPM  . ceFEPime (MAXIPIME) IV  1 g Intravenous Q8H  . furosemide  80 mg Intravenous BID  . gabapentin  600 mg Oral TID  . hydrocortisone   Rectal TID  . levalbuterol  0.63 mg Nebulization TID  . LORazepam  0.5 mg Oral QHS  . mouth rinse  15 mL Mouth Rinse BID  . pantoprazole  40 mg Oral Daily  . potassium chloride (KCL MULTIRUN) 30 mEq in 265 mL IVPB  30 mEq  Intravenous Once  . potassium chloride  40 mEq Oral BID  . sertraline  100 mg Oral Daily  . sodium chloride flush  3 mL Intravenous Q12H  . tamsulosin  0.4 mg Oral QPC supper  . vancomycin  1,000 mg Intravenous Q12H  . [START ON 03/11/2017] Vitamin D (Ergocalciferol)  50,000 Units Oral Q7 days   Continuous Infusions: . diltiazem (CARDIZEM) infusion 5 mg/hr (03/16/17 1012)   PRN Meds:.sodium chloride, acetaminophen, guaiFENesin-dextromethorphan, hydrALAZINE, levalbuterol, ondansetron (ZOFRAN) IV, sodium chloride flush   Antibiotics   Anti-infectives    Start     Dose/Rate Route Frequency Ordered Stop   03/14/17 2200  vancomycin (VANCOCIN) IVPB 1000 mg/200 mL premix     1,000 mg 200 mL/hr over 60 Minutes Intravenous Every 12 hours 03/14/17 0928     03/14/17 0930  ceFEPIme (MAXIPIME) 1 g in dextrose 5 % 50 mL IVPB     1 g 100 mL/hr over 30 Minutes Intravenous Every 8 hours 03/14/17 0925     03/14/17 0930  vancomycin (VANCOCIN) 1,750 mg in sodium chloride 0.9 % 500 mL IVPB     1,750 mg 250 mL/hr over 120 Minutes Intravenous  Once 03/14/17 0623 03/14/17 1221        Subjective:   Paul Valdez was seen and examined today. Feels a lot better, shortness of breath has been improving. Heart rate now better controlled. Cardioversion planned today. O2 sats is still 96% on 4 L. No chest pain.  Patient denies dizziness, abdominal pain, N/V/D/C, new weakness, numbess, tingling. No acute events overnight.    Objective:   Vitals:   03/16/17 1120 03/16/17 1130 03/16/17 1140 03/16/17 1157  BP: 119/83 130/63 133/80 (!) 150/84  Pulse: (!) 43 84 79 74  Resp: '15 20 17 18  '$ Temp:      TempSrc:      SpO2: 95% 95% 93% 96%  Weight:      Height:        Intake/Output Summary (Last 24 hours) at 03/16/17 1319 Last data filed at 03/16/17 1255  Gross per 24 hour  Intake          1586.25 ml  Output             2450 ml  Net          -863.75 ml     Wt Readings from Last 3 Encounters:  03/16/17  83.3 kg (183 lb 9.6 oz)  03/09/17 88.4 kg (194 lb 12.8 oz)  03/01/17  89.3 kg (196 lb 12.8 oz)     Exam  General: Alert and oriented x 3, NAD  HEENT:     Neck: Supple, + JVD  Cardiovascular:  irregularly irregular,    Respiratory: Decreased breath sound at the bases  Gastrointestinal: Soft, nontender, nondistended, + bowel sounds  Ext: no cyanosis clubbing, edema  Neuro: no new deficits  Skin: No rashes  Psych: Normal affect and demeanor, alert and oriented x3    Data Reviewed:  I have personally reviewed following labs and imaging studies  Micro Results Recent Results (from the past 240 hour(s))  MRSA PCR Screening     Status: None   Collection Time: 03/14/17 10:37 AM  Result Value Ref Range Status   MRSA by PCR NEGATIVE NEGATIVE Final    Comment:        The GeneXpert MRSA Assay (FDA approved for NASAL specimens only), is one component of a comprehensive MRSA colonization surveillance program. It is not intended to diagnose MRSA infection nor to guide or monitor treatment for MRSA infections.     Radiology Reports Dg Chest 2 View  Result Date: 03/13/2017 CLINICAL DATA:  Shortness of breath, improved.  Ex-smoker. EXAM: CHEST  2 VIEW COMPARISON:  03/12/2017. FINDINGS: Increased airspace opacity throughout the left lung and in the right lower lung zone. Mildly progressive prominence of the interstitial markings. Moderately large hiatal hernia. Mediastinal surgical clips. The airspace opacity on the left is obscuring the heart borders. Right diaphragmatic eventration. Diffuse osteopenia. IMPRESSION: 1. Progressive changes of congestive heart failure. 2. Moderately large hiatal hernia. Electronically Signed   By: Claudie Revering M.D.   On: 03/13/2017 10:51   Dg Chest 2 View  Result Date: 03/12/2017 CLINICAL DATA:  Low O2 sats. Prior left upper lobectomy. Shortness of breath. EXAM: CHEST  2 VIEW COMPARISON:  02/24/2017 FINDINGS: Postoperative changes in the left upper  lung. Diffuse airspace disease and interstitial prominence throughout both lungs. This could represent edema or infection. Mild cardiomegaly. Large hiatal hernia. No effusions. IMPRESSION: Diffuse interstitial and alveolar opacities throughout the lungs which could reflect edema or infection. Large hiatal hernia. Electronically Signed   By: Rolm Baptise M.D.   On: 03/12/2017 12:27   Dg Chest 2 View  Result Date: 02/24/2017 CLINICAL DATA:  Status post left upper lobectomy on February 01, 2017 for squamous cell malignancy. No current chest complaints. History of asthma and atrial fibrillation, coronary artery disease. No current complaints. EXAM: CHEST  2 VIEW COMPARISON:  Chest x-ray of February 15, 2017 and CT scan of the chest of the same day. FINDINGS: The right lung is adequately inflated. Prominent elevation of a portion of the right hemidiaphragm is stable. On the left there are post upper lobectomy changes. There is no alveolar infiltrate. There is no significant pleural effusion and no pneumothorax. The heart is normal in size. There is calcification in the wall of the aortic arch. The pulmonary vascularity is nearly normal. There is a moderate-sized hiatal hernia. IMPRESSION: Further interval improvement in the appearance of the chest with improved aeration and decreased pulmonary interstitial edema. Persistent large hiatal hernia. Thoracic aortic atherosclerosis. Electronically Signed   By: David  Martinique M.D.   On: 02/24/2017 15:47   Ct Angio Chest Pe W Or Wo Contrast  Result Date: 03/14/2017 CLINICAL DATA:  71 year old male with hypoxia and shortness of breath. History of AFib. History of known cancer and left upper lobectomy. EXAM: CT ANGIOGRAPHY CHEST WITH CONTRAST TECHNIQUE: Multidetector CT imaging of the chest  was performed using the standard protocol during bolus administration of intravenous contrast. Multiplanar CT image reconstructions and MIPs were obtained to evaluate the vascular  anatomy. CONTRAST:  100 cc Isovue 370 COMPARISON:  Chest CT dated 02/15/2017 FINDINGS: Cardiovascular: There is mild cardiomegaly. There is dilatation of the atria. There is reflux of contrast from the right atrium into the IVC consistent with a degree of right cardiac dysfunction. Correlation with echocardiogram recommended. There is no pericardial effusion. There is mild atherosclerotic calcification of the thoracic aorta. The aorta is otherwise unremarkable for the degree of enhancement. There is no CT evidence of pulmonary embolus. Mediastinum/Nodes: Mildly enlarged right hilar lymph nodes measuring 15 mm in short axis. There is no mediastinal adenopathy. There is a moderate size hiatal hernia containing portion of the stomach. The esophagus is grossly unremarkable. Lungs/Pleura: There is postsurgical changes are left upper lobectomy. Persistent small left pleural effusion versus pleural thickening similar to prior CT. There is background of chronic parenchymal disease with subpleural blebs primarily in the right upper and right lower lobe. There is diffuse ground-glass density throughout the lungs with patchy areas of airspace opacity in the right upper lobe and superior portion of the left lung as well as bilateral patchy airspace disease involving the lung bases which are new compared to prior study and most consistent with superimposed pneumonia. Bilateral peribronchial thickening noted. There is no pneumothorax. The central airways are patent. Upper Abdomen: Moderate size hiatal hernia. Nonobstructing bilateral renal calculi measuring up to 6 mm in the interpolar aspect of the left kidney. Colonic diverticulosis. Moderate size hiatal hernia. Musculoskeletal: No chest wall abnormality. No acute or significant osseous findings. Review of the MIP images confirms the above findings. IMPRESSION: 1. No CT evidence of pulmonary embolism. 2. Diffuse patchy airspace disease, new from prior study and most compatible  with pneumonia. Clinical correlation and follow-up to resolution recommended. There is a background of chronic lung disease. 3. Postsurgical changes of left upper lobectomy with persistent small left pleural effusion versus pleural thickening. 4. Mild cardiomegaly with evidence of right cardiac dysfunction. Correlation with echocardiogram recommended. Electronically Signed   By: Anner Crete M.D.   On: 03/14/2017 18:10   Ct Angio Chest Pe W Or Wo Contrast  Result Date: 02/15/2017 CLINICAL DATA:  Lung cancer, CHF, possible health care associated pneumonia, LEFT lower extremity swelling acute onset of shortness of breath/dyspnea question pulmonary embolism ; history hypertension, coronary artery disease post PTCA, asthma, GERD, former smoker, atrial fibrillation EXAM: CT ANGIOGRAPHY CHEST WITH CONTRAST TECHNIQUE: Multidetector CT imaging of the chest was performed using the standard protocol during bolus administration of intravenous contrast. Multiplanar CT image reconstructions and MIPs were obtained to evaluate the vascular anatomy. CONTRAST:  100 cc Isovue 370 IV COMPARISON:  PET-CT 01/04/2017 FINDINGS: Cardiovascular: Atherosclerotic calcifications aorta and minimally in coronary arteries. Aneurysmal dilatation ascending thoracic aorta 4.3 cm transverse image 45. Scattered beam hardening artifacts from dense contrast in the SVC. Pulmonary arteries well opacified and patent. No evidence of pulmonary embolism. No pericardial effusion. Enlargement of cardiac chambers. Mediastinum/Nodes: Large hiatal hernia. Base of cervical region normal appearance. No thoracic adenopathy. Lungs/Pleura: Post LEFT upper lobectomy LEFT pleural thickening/fluid in the LEFT hemithorax. Partial atelectasis of LEFT lower lobe. Peripheral atelectasis and a few subpleural blebs in RIGHT lung. No definite infiltrate or pneumothorax. Significant elevation of RIGHT diaphragm. Upper Abdomen: Unremarkable Musculoskeletal: Diffuse osseous  demineralization. Review of the MIP images confirms the above findings. IMPRESSION: No evidence of pulmonary embolism. Scattered atelectasis RIGHT  lung with postsurgical changes in LEFT lung post LEFT upper lobectomy. Hiatal hernia. Aortic atherosclerosis and coronary arterial calcification with aneurysmal dilatation of the ascending thoracic aorta, 4.3 cm transverse, recommendation below. Recommend annual imaging followup by CTA or MRA. This recommendation follows 2010 ACCF/AHA/AATS/ACR/ASA/SCA/SCAI/SIR/STS/SVM Guidelines for the Diagnosis and Management of Patients with Thoracic Aortic Disease. Circulation. 2010; 121: H417-E081 Electronically Signed   By: Lavonia Dana M.D.   On: 02/15/2017 11:37   Dg Chest Port 1 View  Result Date: 02/15/2017 CLINICAL DATA:  71 year old male with fever and shortness of breath. EXAM: PORTABLE CHEST 1 VIEW COMPARISON:  Chest radiograph dated 02/09/2017 FINDINGS: There is low lung volumes. There progression of airspace density involving the left mid to lower lung fields concerning for developing infiltrate. Right mid and lower lung field interstitial prominence and streaky densities noted. There is no significant pleural effusion. No pneumothorax. There is silhouetting of the cardiac borders. Multiple surgical clips noted in the left hilar and suprahilar region. There is minimally displaced fracture of the lateral left fifth rib which appears new compared to prior study. Correlation with point tenderness recommended. IMPRESSION: 1. Increased left mid for lower lung field airspace opacity concerning for developing pneumonia versus pulmonary contusion. Right mid to lower lung field interstitial prominence and streaky densities may represent atelectasis versus infiltrate. 2. Minimally displaced fracture of the lateral aspect of the left fifth rib, new from prior study. No pneumothorax. Electronically Signed   By: Anner Crete M.D.   On: 02/15/2017 04:10   Dg Chest Port 1v Same  Day  Result Date: 03/14/2017 CLINICAL DATA:  Dyspnea.  Previous left lobectomy for lung cancer. EXAM: PORTABLE CHEST 1 VIEW COMPARISON:  03/13/2017 and 03/12/2017 as well as 02/15/2017 FINDINGS: Lungs are somewhat hypoinflated and demonstrate postsurgical changes of the left lung/ hemithorax compatible previous lobectomy for lung cancer. There is mild overall worsening a path hazy opacification over the left lung as well as slight worsening mixed interstitial airspace density over the right upper lobe as cannot exclude developing infection. No definite effusion. Remainder the exam is unchanged. IMPRESSION: Slight interval worsening opacification over the left lung on background of postsurgical change as well as mild worsen mixed interstitial airspace density over the right upper lobe as cannot exclude developing infection. Electronically Signed   By: Marin Olp M.D.   On: 03/14/2017 08:38    Lab Data:  CBC:  Recent Labs Lab 03/12/17 1149 03/13/17 0503 03/15/17 0458 03/16/17 0342  WBC 9.7 6.9 8.3 8.2  NEUTROABS 6.6  --   --   --   HGB 9.9* 10.1* 10.8* 10.5*  HCT 31.2* 31.3* 33.1* 33.1*  MCV 89.9 89.2 88.3 88.0  PLT 321 300 388 448*   Basic Metabolic Panel:  Recent Labs Lab 03/12/17 1149 03/13/17 0503 03/14/17 0510 03/14/17 0820 03/14/17 1127 03/15/17 0458 03/16/17 0342  NA 136 137 133*  --   --  133* 134*  K 3.4* 3.4* 2.8*  --  3.3* 3.8 3.4*  CL 99* 93* 87*  --   --  90* 87*  CO2 25 35* 35*  --   --  32 35*  GLUCOSE 131* 112* 110*  --   --  105* 115*  BUN '12 14 16  '$ --   --  18 16  CREATININE 1.03 1.05 1.02  --   --  1.00 0.99  CALCIUM 8.6* 8.7* 8.8*  --   --  8.7* 8.7*  MG 1.5* 2.2  --  1.8  --   --   --  GFR: Estimated Creatinine Clearance: 70.7 mL/min (by C-G formula based on SCr of 0.99 mg/dL). Liver Function Tests: No results for input(s): AST, ALT, ALKPHOS, BILITOT, PROT, ALBUMIN in the last 168 hours. No results for input(s): LIPASE, AMYLASE in the last 168  hours. No results for input(s): AMMONIA in the last 168 hours. Coagulation Profile: No results for input(s): INR, PROTIME in the last 168 hours. Cardiac Enzymes:  Recent Labs Lab 03/14/17 0820  TROPONINI 0.04*   BNP (last 3 results) No results for input(s): PROBNP in the last 8760 hours. HbA1C: No results for input(s): HGBA1C in the last 72 hours. CBG: No results for input(s): GLUCAP in the last 168 hours. Lipid Profile: No results for input(s): CHOL, HDL, LDLCALC, TRIG, CHOLHDL, LDLDIRECT in the last 72 hours. Thyroid Function Tests: No results for input(s): TSH, T4TOTAL, FREET4, T3FREE, THYROIDAB in the last 72 hours. Anemia Panel: No results for input(s): VITAMINB12, FOLATE, FERRITIN, TIBC, IRON, RETICCTPCT in the last 72 hours. Urine analysis:    Component Value Date/Time   COLORURINE AMBER (A) 02/15/2017 0344   APPEARANCEUR HAZY (A) 02/15/2017 0344   LABSPEC 1.016 02/15/2017 0344   PHURINE 5.0 02/15/2017 0344   GLUCOSEU NEGATIVE 02/15/2017 0344   HGBUR MODERATE (A) 02/15/2017 0344   BILIRUBINUR NEGATIVE 02/15/2017 0344   KETONESUR NEGATIVE 02/15/2017 0344   PROTEINUR 30 (A) 02/15/2017 0344   NITRITE POSITIVE (A) 02/15/2017 0344   LEUKOCYTESUR SMALL (A) 02/15/2017 0344     Cayne Yom M.D. [facility] 03/16/2017, 1:19 PM  Pager: [phone] Between 7am to 7pm - call Pager - [phone]  After 7pm go to [email or web address] - password [email or web address]  Call night coverage person covering after 7pm

## 2017-03-16 NOTE — Progress Notes (Signed)
PT Cancellation Note  Patient Details Name: Paul Valdez MRN: [id number] DOB: 1946-08-21   Cancelled Treatment:    Reason Eval/Treat Not Completed: Patient at procedure or test/unavailable   Duncan Dull 03/16/2017, 9:56 AM

## 2017-03-17 ENCOUNTER — Encounter: Payer: Medicare Other | Admitting: Surgery

## 2017-03-17 DIAGNOSIS — Z7901 Long term (current) use of anticoagulants: Secondary | ICD-10-CM

## 2017-03-17 LAB — BASIC METABOLIC PANEL
Anion gap: 10 (ref 5–15)
BUN: 15 mg/dL (ref 6–20)
CALCIUM: 9.1 mg/dL (ref 8.9–10.3)
CHLORIDE: 87 mmol/L — AB (ref 101–111)
CO2: 37 mmol/L — ABNORMAL HIGH (ref 22–32)
Creatinine, Ser: 1.05 mg/dL (ref 0.61–1.24)
Glucose, Bld: 112 mg/dL — ABNORMAL HIGH (ref 65–99)
Potassium: 4 mmol/L (ref 3.5–5.1)
SODIUM: 134 mmol/L — AB (ref 135–145)

## 2017-03-17 LAB — CBC
HCT: 36.1 % — ABNORMAL LOW (ref 39.0–52.0)
Hemoglobin: 11.3 g/dL — ABNORMAL LOW (ref 13.0–17.0)
MCH: 28 pg (ref 26.0–34.0)
MCHC: 31.3 g/dL (ref 30.0–36.0)
MCV: 89.4 fL (ref 78.0–100.0)
PLATELETS: 498 10*3/uL — AB (ref 150–400)
RBC: 4.04 MIL/uL — ABNORMAL LOW (ref 4.22–5.81)
RDW: 15.2 % (ref 11.5–15.5)
WBC: 9.4 10*3/uL (ref 4.0–10.5)

## 2017-03-17 MED ORDER — LEVOFLOXACIN 500 MG PO TABS
500.0000 mg | ORAL_TABLET | Freq: Every day | ORAL | Status: AC
  Administered 2017-03-17 – 2017-03-18 (×2): 500 mg via ORAL
  Filled 2017-03-17 (×2): qty 1

## 2017-03-17 MED ORDER — LEVOFLOXACIN 500 MG PO TABS: 500.0000 mg | ORAL_TABLET | Freq: Every day | ORAL | Status: DC

## 2017-03-17 MED ORDER — FUROSEMIDE 40 MG PO TABS
40.0000 mg | ORAL_TABLET | Freq: Two times a day (BID) | ORAL | Status: DC
  Administered 2017-03-17 – 2017-03-18 (×2): 40 mg via ORAL
  Filled 2017-03-17 (×2): qty 1

## 2017-03-17 NOTE — Progress Notes (Signed)
Progress Note  Patient Name: Paul Valdez Date of Encounter: 03/17/2017  Primary Cardiologist: [place] [facility]. He had DCCV yesterday.  Inpatient Medications    Scheduled Meds: . apixaban  5 mg Oral BID  . atorvastatin  40 mg Oral QPM  . diltiazem  180 mg Oral Daily  . flecainide  100 mg Oral Q12H  . furosemide  40 mg Oral BID  . gabapentin  600 mg Oral TID  . hydrocortisone   Rectal TID  . levalbuterol  0.63 mg Nebulization TID  . levofloxacin  500 mg Oral Daily  . LORazepam  0.5 mg Oral QHS  . mouth rinse  15 mL Mouth Rinse BID  . pantoprazole  40 mg Oral Daily  . potassium chloride (KCL MULTIRUN) 30 mEq in 265 mL IVPB  30 mEq Intravenous Once  . potassium chloride  40 mEq Oral BID  . sertraline  100 mg Oral Daily  . sodium chloride flush  3 mL Intravenous Q12H  . tamsulosin  0.4 mg Oral QPC supper  . [START ON 03/24/2017] Vitamin D (Ergocalciferol)  50,000 Units Oral Q7 days   Continuous Infusions:  PRN Meds: sodium chloride, acetaminophen, guaiFENesin-dextromethorphan, hydrALAZINE, levalbuterol, ondansetron (ZOFRAN) IV, sodium chloride flush   Vital Signs    Vitals:   03/17/17 0400 03/17/17 0731 03/17/17 0837 03/17/17 1052  BP: (!) 142/73 110/60  129/68  Pulse: 78     Resp: 20     Temp: 98.5 F (36.9 C) 98.1 F (36.7 C)  97.9 F (36.6 C)  TempSrc: Oral Oral  Oral  SpO2: 92% 90% 90%   Weight: 183 lb 9.6 oz (83.3 kg)     Height:        Intake/Output Summary (Last 24 hours) at 03/17/17 1142 Last data filed at 03/17/17 1100  Gross per 24 hour  Intake          1436.25 ml  Output             2821 ml  Net         -1384.75 ml   Filed Weights   03/15/17 0534 03/16/17 0556 03/17/17 0400  Weight: 184 lb 4.8 oz (83.6 kg) 183 lb 9.6 oz (83.3 kg) 183 lb 9.6 oz (83.3 kg)    Telemetry    NSR - Personally Reviewed  ECG    AFib with RVR on 3/18 - Personally Reviewed  Physical Exam   GEN: No acute distress.   Neck: No  JVD Cardiac: RRR, no murmurs, rubs, or gallops.  Respiratory: Clear to auscultation bilaterally. GI: Soft, nontender, non-distended  MS: No edema; No deformity. Neuro:  Nonfocal  Psych: Normal affect   Labs    Chemistry  Recent Labs Lab 03/15/17 0458 03/16/17 0342 03/17/17 0604  NA 133* 134* 134*  K 3.8 3.4* 4.0  CL 90* 87* 87*  CO2 32 35* 37*  GLUCOSE 105* 115* 112*  BUN '18 16 15  '$ CREATININE 1.00 0.99 1.05  CALCIUM 8.7* 8.7* 9.1  GFRNONAA >60 >60 >60  GFRAA >60 >60 >60  ANIONGAP '11 12 10     '$ Hematology  Recent Labs Lab 03/15/17 0458 03/16/17 0342 03/17/17 0604  WBC 8.3 8.2 9.4  RBC 3.75* 3.76* 4.04*  HGB 10.8* 10.5* 11.3*  HCT 33.1* 33.1* 36.1*  MCV 88.3 88.0 89.4  MCH 28.8 27.9 28.0  MCHC 32.6 31.7 31.3  RDW 15.5 15.0 15.2  PLT 388 422* 498*    Cardiac Enzymes  Recent Labs Lab 03/14/17 0820  TROPONINI 0.04*     Recent Labs Lab 03/12/17 1345  TROPIPOC 0.01     BNP  Recent Labs Lab 03/12/17 1317  BNP 609.6*     DDimer   Recent Labs Lab 03/14/17 0820  DDIMER 3.48*     Radiology    No results found.  Cardiac Studies   EF 60-65% in 2/18  Patient Profile     71 y.o. male with AFib with RVR in the setting of pneumonia  Assessment & Plan    Successful DCCV on 3/20.  Now in NSR.  Flecainide to maintain NSR per EP recommendation.  COntinue anticoagulation.  Has diuresed well to treat acute on chronic diastolic heart failure.  Appears euvolemic.  Anticoagulated.  We typically plan for ETT post starting flecainide to r/o exercise induced arrhtyhmias.  Will have to see how his exercise capacity does as to when ETT will be possible.  DOE: Likely multifactorial.  s/p lung surgery.  May need PT post cardioversion to help with ambulation.  Being more active will be important.  ABx for pneumonia.  Liklely trigger for increased rates.  Per primary team.  Lung disease: COntinue oxygen. Wears 3L/min at home since his  surgery.  Signed, Larae Grooms, MD  03/17/2017, 11:42 AM

## 2017-03-17 NOTE — Progress Notes (Signed)
[facility]                                                                              Patient Demographics  Paul Valdez, is a 71 y.o. male, DOB - 1946/03/06, BMW:[id number]  Admit date - 03/12/2017   Admitting Physician Elwin Mocha, MD  Outpatient Primary MD for the patient is Gara Kroner, MD  Outpatient specialists:   LOS - 4  days    Chief Complaint  Patient presents with  . Shortness of Breath       Brief summary   Patient is a 71 year old male with hypertension, hyperlipidemia, CAD, COPD, A. fib, mass in his upper lobe of left lung status post lobectomy presented with shortness of breath. Patient had upper lobe lobectomy in 2/18 secondary to squamous cell carcinoma, subsequently was readmitted again with HCAP  was DC'd on 3 L of O2. He was also discharged home on Lasix 40 mg daily. Per patient he was noticed to have hypoxia with O2 sats of 65% by his home health nurse and was recommended to come to the ER. Otherwise he denied any chest pain, palpitations, coughing or any lower extremity edema.He reports compliance with his oxygen at home but at the time of admission is wondering if perhaps the machine is "malfunctioning". He reports mild intermittent nonproductive cough In the ER, O2 sats were 88% on 3 L and was given Lasix intravenously and admitted for further workup  Assessment & Plan    Principal Problem: Acute respiratory failure with hypoxia likely related to acute on chronic diastolic heart failure, recent left upper lobe lobectomy. -  Chest x-ray on admission with diffuse interstitial and alvolar opacities throughout the lungs which could reflect edema or infection.  -diuresed with IV lasix, improving, negative 8.1L, euvolemic now, change to Po lasix, was taking '40mg'$  daily at home - On 3/18, patient was noticed to be desatting, was placed on 6 L, in A. fib with RVR. Chest x-ray also showed possible pneumonia. CT chest showed no PE but diffuse  patchy airspace disease, new, compatible with pneumonia, was placed on vancomycin and cefepime, completed 3days of Abx, will change to levaquin to 2days for 5total days - Placed on Xopenex nebs for wheezing, will need home O2 evaluation prior to DC   Acute on chronic diastolic heart failure. Echo done in February 2018 reveals EF of 60% mild LVH. Patient was discharged on Lasix 40 mg daily  - improved with IV lasix, now change to PO '40mg'$  BID - negative 8.1L- Weight down from 194 on admission to 183 - Per cardiology, likely atrial fibrillation is contributing to CHF s/p Cardioversion 3/20, remains in NSR now  Atrial fibrillation with RVR -  chadvasc score 5. continue eliquis -  s/p DCCV, in NSR now  Hypertension. -Continue home meds  Anemia. Likely related to chronic disease.  -Hemoglobin 9.9 on admission. Chart review indicates this is close to his baseline.    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  - Patient following Dr. Julien Nordmann and Dr Cyndia Bent   Dyslipidemia  continue statin  Code Status: full  DVT  Prophylaxis:  SCD's Family Communication: Discussed in detail with the patient, all imaging results, lab results explained to the patient  Disposition Plan: Home tomorrow if stable  Time Spent in minutes   35 minutes  Procedures:  Cardioversion 3/20  Consultants:   Cardiology  Antimicrobials:   IV vancomycin 3/18  IV cefepime 3/18   Medications  Scheduled Meds: . apixaban  5 mg Oral BID  . atorvastatin  40 mg Oral QPM  . diltiazem  180 mg Oral Daily  . flecainide  100 mg Oral Q12H  . furosemide  40 mg Oral BID  . gabapentin  600 mg Oral TID  . hydrocortisone   Rectal TID  . levalbuterol  0.63 mg Nebulization TID  . levofloxacin  500 mg Oral Daily  . LORazepam  0.5 mg Oral QHS  . mouth rinse  15 mL Mouth Rinse BID  . pantoprazole  40 mg Oral Daily  . potassium chloride (KCL MULTIRUN) 30 mEq in 265 mL IVPB  30 mEq Intravenous  Once  . potassium chloride  40 mEq Oral BID  . sertraline  100 mg Oral Daily  . sodium chloride flush  3 mL Intravenous Q12H  . tamsulosin  0.4 mg Oral QPC supper  . [START ON 03/06/2017] Vitamin D (Ergocalciferol)  50,000 Units Oral Q7 days   Continuous Infusions:  PRN Meds:.sodium chloride, acetaminophen, guaiFENesin-dextromethorphan, hydrALAZINE, levalbuterol, ondansetron (ZOFRAN) IV, sodium chloride flush   Antibiotics   Anti-infectives    Start     Dose/Rate Route Frequency Ordered Stop   03/17/17 2200  levofloxacin (LEVAQUIN) tablet 500 mg     500 mg Oral Daily 03/17/17 0958 03/18/17 0959   03/14/17 2200  vancomycin (VANCOCIN) IVPB 1000 mg/200 mL premix  Status:  Discontinued     1,000 mg 200 mL/hr over 60 Minutes Intravenous Every 12 hours 03/14/17 0928 03/17/17 1122   03/14/17 0930  ceFEPIme (MAXIPIME) 1 g in dextrose 5 % 50 mL IVPB  Status:  Discontinued     1 g 100 mL/hr over 30 Minutes Intravenous Every 8 hours 03/14/17 0925 03/17/17 1122   03/14/17 0930  vancomycin (VANCOCIN) 1,750 mg in sodium chloride 0.9 % 500 mL IVPB     1,750 mg 250 mL/hr over 120 Minutes Intravenous  Once 03/14/17 6301 03/14/17 1221        Subjective:   Ormand Senn was seen and examined today. Feels a lot better, shortness of breath has been improving. Heart rate now better controlled. Cardioversion planned today. O2 sats is still 96% on 4 L. No chest pain.  Patient denies dizziness, abdominal pain, N/V/D/C, new weakness, numbess, tingling. No acute events overnight.    Objective:   Vitals:   03/17/17 0400 03/17/17 0731 03/17/17 0837 03/17/17 1052  BP: (!) 142/73 110/60  129/68  Pulse: 78     Resp: 20     Temp: 98.5 F (36.9 C) 98.1 F (36.7 C)  97.9 F (36.6 C)  TempSrc: Oral Oral  Oral  SpO2: 92% 90% 90%   Weight: 83.3 kg (183 lb 9.6 oz)     Height:        Intake/Output Summary (Last 24 hours) at 03/17/17 1123 Last data filed at 03/17/17 1100  Gross per 24 hour  Intake           1436.25 ml  Output             2821 ml  Net         -1384.75 ml  Wt Readings from Last 3 Encounters:  03/17/17 83.3 kg (183 lb 9.6 oz)  03/09/17 88.4 kg (194 lb 12.8 oz)  03/01/17 89.3 kg (196 lb 12.8 oz)     Exam  General: Alert and oriented x 3, NAD  HEENT:     Neck: Supple, + JVD  Cardiovascular:  irregularly irregular,    Respiratory: Decreased breath sound at the bases  Gastrointestinal: Soft, nontender, nondistended, + bowel sounds  Ext: no cyanosis clubbing, edema  Neuro: no new deficits  Skin: No rashes  Psych: Normal affect and demeanor, alert and oriented x3    Data Reviewed:  I have personally reviewed following labs and imaging studies  Micro Results Recent Results (from the past 240 hour(s))  MRSA PCR Screening     Status: None   Collection Time: 03/14/17 10:37 AM  Result Value Ref Range Status   MRSA by PCR NEGATIVE NEGATIVE Final    Comment:        The GeneXpert MRSA Assay (FDA approved for NASAL specimens only), is one component of a comprehensive MRSA colonization surveillance program. It is not intended to diagnose MRSA infection nor to guide or monitor treatment for MRSA infections.     Radiology Reports Dg Chest 2 View  Result Date: 03/13/2017 CLINICAL DATA:  Shortness of breath, improved.  Ex-smoker. EXAM: CHEST  2 VIEW COMPARISON:  03/12/2017. FINDINGS: Increased airspace opacity throughout the left lung and in the right lower lung zone. Mildly progressive prominence of the interstitial markings. Moderately large hiatal hernia. Mediastinal surgical clips. The airspace opacity on the left is obscuring the heart borders. Right diaphragmatic eventration. Diffuse osteopenia. IMPRESSION: 1. Progressive changes of congestive heart failure. 2. Moderately large hiatal hernia. Electronically Signed   By: Claudie Revering M.D.   On: 03/13/2017 10:51   Dg Chest 2 View  Result Date: 03/12/2017 CLINICAL DATA:  Low O2 sats. Prior left upper  lobectomy. Shortness of breath. EXAM: CHEST  2 VIEW COMPARISON:  02/24/2017 FINDINGS: Postoperative changes in the left upper lung. Diffuse airspace disease and interstitial prominence throughout both lungs. This could represent edema or infection. Mild cardiomegaly. Large hiatal hernia. No effusions. IMPRESSION: Diffuse interstitial and alveolar opacities throughout the lungs which could reflect edema or infection. Large hiatal hernia. Electronically Signed   By: Rolm Baptise M.D.   On: 03/12/2017 12:27   Dg Chest 2 View  Result Date: 02/24/2017 CLINICAL DATA:  Status post left upper lobectomy on February 01, 2017 for squamous cell malignancy. No current chest complaints. History of asthma and atrial fibrillation, coronary artery disease. No current complaints. EXAM: CHEST  2 VIEW COMPARISON:  Chest x-ray of February 15, 2017 and CT scan of the chest of the same day. FINDINGS: The right lung is adequately inflated. Prominent elevation of a portion of the right hemidiaphragm is stable. On the left there are post upper lobectomy changes. There is no alveolar infiltrate. There is no significant pleural effusion and no pneumothorax. The heart is normal in size. There is calcification in the wall of the aortic arch. The pulmonary vascularity is nearly normal. There is a moderate-sized hiatal hernia. IMPRESSION: Further interval improvement in the appearance of the chest with improved aeration and decreased pulmonary interstitial edema. Persistent large hiatal hernia. Thoracic aortic atherosclerosis. Electronically Signed   By: David  Martinique M.D.   On: 02/24/2017 15:47   Ct Angio Chest Pe W Or Wo Contrast  Result Date: 03/14/2017 CLINICAL DATA:  71 year old male with hypoxia and shortness of breath.  History of AFib. History of known cancer and left upper lobectomy. EXAM: CT ANGIOGRAPHY CHEST WITH CONTRAST TECHNIQUE: Multidetector CT imaging of the chest was performed using the standard protocol during bolus  administration of intravenous contrast. Multiplanar CT image reconstructions and MIPs were obtained to evaluate the vascular anatomy. CONTRAST:  100 cc Isovue 370 COMPARISON:  Chest CT dated 02/15/2017 FINDINGS: Cardiovascular: There is mild cardiomegaly. There is dilatation of the atria. There is reflux of contrast from the right atrium into the IVC consistent with a degree of right cardiac dysfunction. Correlation with echocardiogram recommended. There is no pericardial effusion. There is mild atherosclerotic calcification of the thoracic aorta. The aorta is otherwise unremarkable for the degree of enhancement. There is no CT evidence of pulmonary embolus. Mediastinum/Nodes: Mildly enlarged right hilar lymph nodes measuring 15 mm in short axis. There is no mediastinal adenopathy. There is a moderate size hiatal hernia containing portion of the stomach. The esophagus is grossly unremarkable. Lungs/Pleura: There is postsurgical changes are left upper lobectomy. Persistent small left pleural effusion versus pleural thickening similar to prior CT. There is background of chronic parenchymal disease with subpleural blebs primarily in the right upper and right lower lobe. There is diffuse ground-glass density throughout the lungs with patchy areas of airspace opacity in the right upper lobe and superior portion of the left lung as well as bilateral patchy airspace disease involving the lung bases which are new compared to prior study and most consistent with superimposed pneumonia. Bilateral peribronchial thickening noted. There is no pneumothorax. The central airways are patent. Upper Abdomen: Moderate size hiatal hernia. Nonobstructing bilateral renal calculi measuring up to 6 mm in the interpolar aspect of the left kidney. Colonic diverticulosis. Moderate size hiatal hernia. Musculoskeletal: No chest wall abnormality. No acute or significant osseous findings. Review of the MIP images confirms the above findings.  IMPRESSION: 1. No CT evidence of pulmonary embolism. 2. Diffuse patchy airspace disease, new from prior study and most compatible with pneumonia. Clinical correlation and follow-up to resolution recommended. There is a background of chronic lung disease. 3. Postsurgical changes of left upper lobectomy with persistent small left pleural effusion versus pleural thickening. 4. Mild cardiomegaly with evidence of right cardiac dysfunction. Correlation with echocardiogram recommended. Electronically Signed   By: Anner Crete M.D.   On: 03/14/2017 18:10   Dg Chest Port 1v Same Day  Result Date: 03/14/2017 CLINICAL DATA:  Dyspnea.  Previous left lobectomy for lung cancer. EXAM: PORTABLE CHEST 1 VIEW COMPARISON:  03/13/2017 and 03/12/2017 as well as 02/15/2017 FINDINGS: Lungs are somewhat hypoinflated and demonstrate postsurgical changes of the left lung/ hemithorax compatible previous lobectomy for lung cancer. There is mild overall worsening a path hazy opacification over the left lung as well as slight worsening mixed interstitial airspace density over the right upper lobe as cannot exclude developing infection. No definite effusion. Remainder the exam is unchanged. IMPRESSION: Slight interval worsening opacification over the left lung on background of postsurgical change as well as mild worsen mixed interstitial airspace density over the right upper lobe as cannot exclude developing infection. Electronically Signed   By: Marin Olp M.D.   On: 03/14/2017 08:38    Lab Data:  CBC:  Recent Labs Lab 03/12/17 1149 03/13/17 0503 03/15/17 0458 03/16/17 0342 03/17/17 0604  WBC 9.7 6.9 8.3 8.2 9.4  NEUTROABS 6.6  --   --   --   --   HGB 9.9* 10.1* 10.8* 10.5* 11.3*  HCT 31.2* 31.3* 33.1* 33.1* 36.1*  MCV 89.9 89.2 88.3 88.0 89.4  PLT 321 300 388 422* 514*   Basic Metabolic Panel:  Recent Labs Lab 03/12/17 1149 03/13/17 0503 03/14/17 0510 03/14/17 0820 03/14/17 1127 03/15/17 0458  03/16/17 0342 03/17/17 0604  NA 136 137 133*  --   --  133* 134* 134*  K 3.4* 3.4* 2.8*  --  3.3* 3.8 3.4* 4.0  CL 99* 93* 87*  --   --  90* 87* 87*  CO2 25 35* 35*  --   --  32 35* 37*  GLUCOSE 131* 112* 110*  --   --  105* 115* 112*  BUN '12 14 16  '$ --   --  '18 16 15  '$ CREATININE 1.03 1.05 1.02  --   --  1.00 0.99 1.05  CALCIUM 8.6* 8.7* 8.8*  --   --  8.7* 8.7* 9.1  MG 1.5* 2.2  --  1.8  --   --   --   --    GFR: Estimated Creatinine Clearance: 66.6 mL/min (by C-G formula based on SCr of 1.05 mg/dL). Liver Function Tests: No results for input(s): AST, ALT, ALKPHOS, BILITOT, PROT, ALBUMIN in the last 168 hours. No results for input(s): LIPASE, AMYLASE in the last 168 hours. No results for input(s): AMMONIA in the last 168 hours. Coagulation Profile: No results for input(s): INR, PROTIME in the last 168 hours. Cardiac Enzymes:  Recent Labs Lab 03/14/17 0820  TROPONINI 0.04*   BNP (last 3 results) No results for input(s): PROBNP in the last 8760 hours. HbA1C: No results for input(s): HGBA1C in the last 72 hours. CBG: No results for input(s): GLUCAP in the last 168 hours. Lipid Profile: No results for input(s): CHOL, HDL, LDLCALC, TRIG, CHOLHDL, LDLDIRECT in the last 72 hours. Thyroid Function Tests: No results for input(s): TSH, T4TOTAL, FREET4, T3FREE, THYROIDAB in the last 72 hours. Anemia Panel: No results for input(s): VITAMINB12, FOLATE, FERRITIN, TIBC, IRON, RETICCTPCT in the last 72 hours. Urine analysis:    Component Value Date/Time   COLORURINE AMBER (A) 02/15/2017 0344   APPEARANCEUR HAZY (A) 02/15/2017 0344   LABSPEC 1.016 02/15/2017 0344   PHURINE 5.0 02/15/2017 0344   GLUCOSEU NEGATIVE 02/15/2017 0344   HGBUR MODERATE (A) 02/15/2017 0344   BILIRUBINUR NEGATIVE 02/15/2017 0344   KETONESUR NEGATIVE 02/15/2017 0344   PROTEINUR 30 (A) 02/15/2017 0344   NITRITE POSITIVE (A) 02/15/2017 0344   LEUKOCYTESUR SMALL (A) 02/15/2017 0344     Pao Haffey  M.D. [facility] 03/17/2017, 11:23 AM  Pager: [phone]  After 7pm go to [email or web address] - password [email or web address]  Call night coverage person covering after 7pm

## 2017-03-17 NOTE — Care Management Important Message (Signed)
Important Message  Patient Details  Name: Paul Valdez MRN: [id number] Date of Birth: 1946/03/13   Medicare Important Message Given:  Yes    Orbie Pyo 03/17/2017, 12:13 PM

## 2017-03-17 NOTE — Progress Notes (Signed)
   03/17/17 1056  Mobility  Activity Ambulate in hall   Pt Pre O2 Sat% on 4L O2 was: 90  Pt O2 Sat% during ambulation on 6L O2   03/17/17 1056  Mobility  Activity Ambulate in hall  was: 79  Pt Post O2 Sat% on 4L O2 was: 89  Pt BP pre ambulation was 124/68 Pt BP post ambulation was 159/94

## 2017-03-17 NOTE — Progress Notes (Addendum)
Physical Therapy Treatment Patient Details Name: Paul Valdez MRN: [id number] DOB: 1946-01-29 Today's Date: 03/17/2017    History of Present Illness Patient is a 71 yo male admitted 03/12/17 with SOB, resp failure with hypoxia, anemia.     PMH:  Lung CA s/p LUL lobectomy 01/2017, HTN, HLD, CAD, Afib, CHF, home O2 at 3L, arthritis, sleep apnea.    PT Comments    Pt seen for mobility progression. Ambulated with use of rollator, one standing rest break. Ambulated on 4 liters [place] with saturations dropping to 82%, improved with pursed lip breathing and rest. Provided patient with IS and reviewed use. Will continue with current POC.  Follow Up Recommendations  Home health PT;Supervision - Intermittent     Equipment Recommendations  None recommended by PT    Recommendations for Other Services       Precautions / Restrictions Precautions Precautions: Fall Precaution Comments: On O2 at 6L with ambulation 4 liters at rest Restrictions Weight Bearing Restrictions: No    Mobility  Bed Mobility Overal bed mobility: Modified Independent             General bed mobility comments: Increased time and use of bed rail.  Transfers Overall transfer level: Needs assistance Equipment used: None;4-wheeled walker Transfers: Sit to/from Stand Sit to Stand: Min guard         General transfer comment: Min guard for safety  Ambulation/Gait Ambulation/Gait assistance: Min guard Ambulation Distance (Feet): 310 Feet Assistive device: 4-wheeled walker Gait Pattern/deviations: Step-through pattern;Decreased stride length;Drifts right/left;Trunk flexed Gait velocity: Decreased Gait velocity interpretation: Below normal speed for age/gender General Gait Details: VCs for safety with use of rollator, one standing rest break. Ambulated on 4 liters [place] with saturations dropping to 82%, improved with pursed lip breathing and rest   Stairs            Wheelchair Mobility    Modified  [place] (Stroke Patients Only)       Balance Overall balance assessment: Needs assistance   Sitting balance-Leahy Scale: Good     Standing balance support: Bilateral upper extremity supported Standing balance-Leahy Scale: Fair                      Cognition Arousal/Alertness: Awake/alert Behavior During Therapy: WFL for tasks assessed/performed Overall Cognitive Status: No family/caregiver present to determine baseline cognitive functioning                      Exercises      General Comments        Pertinent Vitals/Pain Pain Assessment: No/denies pain    Home Living                      Prior Function            PT Goals (current goals can now be found in the care plan section) Acute Rehab PT Goals Patient Stated Goal: To get stronger and return home. PT Goal Formulation: With patient Time For Goal Achievement: 03/20/17 Potential to Achieve Goals: Good Progress towards PT goals: Progressing toward goals    Frequency    Min 3X/week      PT Plan Current plan remains appropriate    Co-evaluation             End of Session Equipment Utilized During Treatment: Gait belt;Oxygen Activity Tolerance: Patient tolerated treatment well Patient left: in chair;with call bell/phone within reach Nurse Communication: Mobility status (HR increase with  activity, desaturation to 86%) PT Visit Diagnosis: Unsteadiness on feet (R26.81);Muscle weakness (generalized) (M62.81);Difficulty in walking, not elsewhere classified (R26.2)     Time: 6067-7034 PT Time Calculation (min) (ACUTE ONLY): 18 min  Charges:  $Gait Training: 8-22 mins                    G Codes:       Duncan Dull 2017-04-01, 5:14 PM Alben Deeds, [employer] DPT  [phone]

## 2017-03-18 ENCOUNTER — Telehealth: Payer: Self-pay | Admitting: Interventional Cardiology

## 2017-03-18 LAB — CBC
HCT: 32.3 % — ABNORMAL LOW (ref 39.0–52.0)
Hemoglobin: 10.4 g/dL — ABNORMAL LOW (ref 13.0–17.0)
MCH: 28.5 pg (ref 26.0–34.0)
MCHC: 32.2 g/dL (ref 30.0–36.0)
MCV: 88.5 fL (ref 78.0–100.0)
PLATELETS: 463 10*3/uL — AB (ref 150–400)
RBC: 3.65 MIL/uL — ABNORMAL LOW (ref 4.22–5.81)
RDW: 15.3 % (ref 11.5–15.5)
WBC: 9.1 10*3/uL (ref 4.0–10.5)

## 2017-03-18 LAB — BASIC METABOLIC PANEL
Anion gap: 10 (ref 5–15)
BUN: 18 mg/dL (ref 6–20)
CALCIUM: 9 mg/dL (ref 8.9–10.3)
CO2: 33 mmol/L — AB (ref 22–32)
CREATININE: 1.07 mg/dL (ref 0.61–1.24)
Chloride: 91 mmol/L — ABNORMAL LOW (ref 101–111)
GFR calc non Af Amer: >60 mL/min (ref >60)
GLUCOSE: 113 mg/dL — AB (ref 65–99)
Potassium: 4.1 mmol/L (ref 3.5–5.1)
Sodium: 134 mmol/L — ABNORMAL LOW (ref 135–145)

## 2017-03-18 MED ORDER — FUROSEMIDE 40 MG PO TABS: 40.0000 mg | ORAL_TABLET | Freq: Two times a day (BID) | ORAL | 1 refills | Status: AC

## 2017-03-18 MED ORDER — FLECAINIDE ACETATE 100 MG PO TABS: 100.0000 mg | ORAL_TABLET | Freq: Two times a day (BID) | ORAL | 0 refills | Status: AC

## 2017-03-18 MED ORDER — POTASSIUM CHLORIDE ER 20 MEQ PO TBCR: 40.0000 meq | EXTENDED_RELEASE_TABLET | Freq: Two times a day (BID) | ORAL | 1 refills | Status: AC

## 2017-03-18 NOTE — Progress Notes (Addendum)
Pt has orders to be discharged. Discharge instructions given and pt has no additional questions at this time. Medication regimen reviewed and pt educated. Pt verbalized understanding and has no additional questions. Telemetry box removed. IV removed and site in good condition. States he ran out of oxygen at home and oxygen generator is broken. Pt stable and waiting for portable oxygen tank and [place] will go to his home to check oxygen per case manager.

## 2017-03-18 NOTE — Progress Notes (Signed)
Progress Note  Patient Name: Paul Valdez Date of Encounter: 03/18/2017  Primary Cardiologist: [place] better. Maintaining NSR since DCCV.  Inpatient Medications    Scheduled Meds: . apixaban  5 mg Oral BID  . atorvastatin  40 mg Oral QPM  . diltiazem  180 mg Oral Daily  . flecainide  100 mg Oral Q12H  . furosemide  40 mg Oral BID  . gabapentin  600 mg Oral TID  . hydrocortisone   Rectal TID  . levalbuterol  0.63 mg Nebulization TID  . levofloxacin  500 mg Oral Daily  . LORazepam  0.5 mg Oral QHS  . mouth rinse  15 mL Mouth Rinse BID  . pantoprazole  40 mg Oral Daily  . potassium chloride (KCL MULTIRUN) 30 mEq in 265 mL IVPB  30 mEq Intravenous Once  . potassium chloride  40 mEq Oral BID  . sertraline  100 mg Oral Daily  . sodium chloride flush  3 mL Intravenous Q12H  . tamsulosin  0.4 mg Oral QPC supper  . [START ON 03/18/2017] Vitamin D (Ergocalciferol)  50,000 Units Oral Q7 days   Continuous Infusions:  PRN Meds: sodium chloride, acetaminophen, guaiFENesin-dextromethorphan, hydrALAZINE, levalbuterol, ondansetron (ZOFRAN) IV, sodium chloride flush   Vital Signs    Vitals:   03/17/17 2037 03/17/17 2057 03/18/17 0645 03/18/17 0743  BP:  115/69 132/69   Pulse: 78 84 77   Resp: '18 18 18   '$ Temp:  98.1 F (36.7 C) 98.2 F (36.8 C)   TempSrc:  Oral Oral   SpO2: 92% 92% 92% 90%  Weight:   182 lb 12.8 oz (82.9 kg)   Height:        Intake/Output Summary (Last 24 hours) at 03/18/17 0925 Last data filed at 03/18/17 0908  Gross per 24 hour  Intake              702 ml  Output             1872 ml  Net            -1170 ml   Filed Weights   03/16/17 0556 03/17/17 0400 03/18/17 0645  Weight: 183 lb 9.6 oz (83.3 kg) 183 lb 9.6 oz (83.3 kg) 182 lb 12.8 oz (82.9 kg)    Telemetry    NSR - Personally Reviewed  ECG    AFib with RVR on 3/18 - Personally Reviewed  Physical Exam   GEN: No acute distress.   Neck: No JVD Cardiac: RRR, no  murmurs, rubs, or gallops.  Respiratory: Coarse breath sounds to auscultation bilaterally. GI: Soft, nontender, non-distended  MS: No edema; No deformity. Neuro:  Nonfocal  Psych: Normal affect   Labs    Chemistry  Recent Labs Lab 03/16/17 0342 03/17/17 0604 03/18/17 0035  NA 134* 134* 134*  K 3.4* 4.0 4.1  CL 87* 87* 91*  CO2 35* 37* 33*  GLUCOSE 115* 112* 113*  BUN '16 15 18  '$ CREATININE 0.99 1.05 1.07  CALCIUM 8.7* 9.1 9.0  GFRNONAA >60 >60 >60  GFRAA >60 >60 >60  ANIONGAP '12 10 10     '$ Hematology  Recent Labs Lab 03/16/17 0342 03/17/17 0604 03/18/17 0035  WBC 8.2 9.4 9.1  RBC 3.76* 4.04* 3.65*  HGB 10.5* 11.3* 10.4*  HCT 33.1* 36.1* 32.3*  MCV 88.0 89.4 88.5  MCH 27.9 28.0 28.5  MCHC 31.7 31.3 32.2  RDW 15.0 15.2 15.3  PLT 422* 498* 463*  Cardiac Enzymes  Recent Labs Lab 03/14/17 0820  TROPONINI 0.04*     Recent Labs Lab 03/12/17 1345  TROPIPOC 0.01     BNP  Recent Labs Lab 03/12/17 1317  BNP 609.6*     DDimer   Recent Labs Lab 03/14/17 0820  DDIMER 3.48*     Radiology    No results found.  Cardiac Studies   EF 60-65% in 2/18  Patient Profile     71 y.o. male with AFib with RVR in the setting of pneumonia  Assessment & Plan    Successful DCCV on 3/20.  Now in NSR.  Flecainide to maintain NSR per EP recommendation.  Continue anticoagulation.  Has diuresed well to treat acute on chronic diastolic heart failure.  Appears euvolemic.  Anticoagulated with Eliquis.  I stressed the importance of staying on his Eliquis.    DOE: Likely multifactorial.  s/p lung surgery.  May need PT post cardioversion to help with ambulation.  Being more active will be important.  Continue Lasix 40 mg BID.  Will need BMet when he comes to the office in a week and diuretic adjustment.  I hope that diastolic function will be improved in NSR.  We typically plan for ETT post starting flecainide to r/o exercise induced arrhtyhmias.  Will have to see  how his exercise capacity does as to when ETT will be possible. At this point, he is on supplemental oxygen.   ABx for pneumonia.  Likely trigger for increased rates.  Per primary team.  Lung disease: COntinue oxygen. Wears 3L/min at home since his surgery.  Signed, Larae Grooms, MD  03/18/2017, 9:25 AM

## 2017-03-18 NOTE — Discharge Summary (Addendum)
Physician Discharge Summary  Paul Valdez:[id number] DOB: 04-Jun-1946 DOA: 03/12/2017  PCP: Gara Kroner, MD  Admit date: 03/12/2017 Discharge date: 03/18/2017  Time spent: 35 minutes  Recommendations for Outpatient Follow-up:  1. Cards [facility] 3/29, newly started on Flecainide, please check Bmet in 1week 2. PCP Dr.Swayne in 1-2weeks 3. Home health RN/PT set up at discharge  Discharge Diagnoses:  Principal Problem:   Acute respiratory failure (HCC)   Acute on chronic diastolic congestive heart failure (HCC)   Afib with RVR   Carotid artery disease (HCC)   Essential hypertension   Hyperlipidemia   OSA (obstructive sleep apnea)   Persistent atrial fibrillation with rapid ventricular response (HCC)   Lung cancer (HCC)   Anemia   Acute respiratory distress   Hypokalemia   Dyspnea   Anticoagulated   Discharge Condition: stable  Diet recommendation: low sodium, heart healthy  Filed Weights   03/16/17 0556 03/17/17 0400 03/18/17 0645  Weight: 83.3 kg (183 lb 9.6 oz) 83.3 kg (183 lb 9.6 oz) 82.9 kg (182 lb 12.8 oz)    History of present illness:  Patient is a 71 year old male with hypertension, hyperlipidemia, CAD, COPD, A. fib, mass in his upper lobe of left lung status post lobectomy presented with shortness of breath. Patient had upper lobe lobectomy in 2/18 secondary to squamous cell carcinoma, subsequently was readmitted again with HCAP  was DC'd on 3 L of O2. He was also discharged home on Lasix 40 mg daily. Per patient he was noticed to have hypoxia with O2 sats of 65% by his home health nurse and was recommended to come to the ER.   Hospital Course:   Acute respiratory failure with hypoxia related to acute on chronic diastolic heart failure -  CXR with diffuse interstitial and alvolar opacitiesthroughout the lungs ? edema or infection.  -diuresed with IV lasix and was improving on 3/18, he was noted to have worsening hypoxia and had a CT chest which noted  no PE but diffuse patchy airspace disease, new, concerning for pneumonia, was placed on vancomycin and cefepime, completed 4 days of Abx, then changed to PO levaquin for 2 days completed 6 days of ABx and then stopped -in terms of his volume status much improved, diuresed well, negative 9.4L, euvolemic now, changed to Po lasix, followed by Cardiology this admission. FU with Dr.Varanasi made for 3/29   Acute on chronic diastolic heart failure. Echo done in February 2018 reveals EF of 60% mild LVH. - improved with IV lasix,change to PO '40mg'$  BID at discharge - negative 9.4L- Weight down from 194 on admission to 182 at discharge - Per cardiology, likely atrial fibrillation was contributing to CHF s/p Cardioversion 3/20, remains in NSR now  Atrial fibrillation with RVR -  chadvasc score 5. continue eliquis -  s/p DCCV, in NSR now, started on FLecainide by EP  Hypertension. -Continue home meds  Anemia. Likely related to chronic disease.  -Hemoglobin 9.9 on admission. Chart review indicates this is close to his baseline.   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  - Patient following Dr. Julien Nordmann and Dr Cyndia Bent   Dyslipidemia  continue statin  Procedures:  Cardioversion 3/20  Consultants:   Cardiology  Discharge Exam: Vitals:   03/18/17 0645 03/18/17 0928  BP: 132/69 119/79  Pulse: 77   Resp: 18 18  Temp: 98.2 F (36.8 C)     General: AAOx3 Cardiovascular: S1S2/RRR Respiratory: CTAB/improved air movement  Discharge Instructions  Discharge Instructions    Diet - low sodium heart healthy    Complete by:  As directed    Increase activity slowly    Complete by:  As directed      Current Discharge Medication List    START taking these medications   Details  flecainide (TAMBOCOR) 100 MG tablet Take 1 tablet (100 mg total) by mouth every 12 (twelve) hours. Qty: 60 tablet, Refills: 0      CONTINUE these medications which have  CHANGED   Details  furosemide (LASIX) 40 MG tablet Take 1 tablet (40 mg total) by mouth 2 (two) times daily. Qty: 60 tablet, Refills: 1    Potassium Chloride ER 20 MEQ TBCR Take 40 mEq by mouth 2 (two) times daily. Qty: 90 tablet, Refills: 1      CONTINUE these medications which have NOT CHANGED   Details  apixaban (ELIQUIS) 5 MG TABS tablet Take 1 tablet (5 mg total) by mouth 2 (two) times daily. Qty: 60 tablet, Refills: 1    atorvastatin (LIPITOR) 40 MG tablet Take 1 tablet (40 mg total) by mouth every evening. Qty: 90 tablet, Refills: 1    cyanocobalamin (,VITAMIN B-12,) 1000 MCG/ML injection Inject 1 mL (1,000 mcg total) into the skin every 30 (thirty) days. Please go to your [facility] MD's office for this injection Qty: 10 mL, Refills: 0    diltiazem (CARDIZEM CD) 180 MG 24 hr capsule Take 1 capsule (180 mg total) by mouth daily. Qty: 90 capsule, Refills: 0    gabapentin (NEURONTIN) 300 MG capsule Take 2 capsules (600 mg total) by mouth 3 (three) times daily. Qty: 180 capsule, Refills: 0    hydrocortisone (ANUSOL-HC) 2.5 % rectal cream Place rectally 3 (three) times daily. For 5 days only. Qty: 30 g, Refills: 0    omeprazole (PRILOSEC) 20 MG capsule Take 1 capsule (20 mg total) by mouth every evening. Qty: 30 capsule, Refills: 0    sertraline (ZOLOFT) 100 MG tablet Take 1 tablet (100 mg total) by mouth daily. Qty: 30 tablet, Refills: 0    tamsulosin (FLOMAX) 0.4 MG CAPS capsule Take 1 capsule (0.4 mg total) by mouth daily after supper. Qty: 30 capsule, Refills: 0    Vitamin D, Ergocalciferol, (DRISDOL) 50000 units CAPS capsule Take 1 capsule (50,000 Units total) by mouth every 7 (seven) days. Every Friday Qty: 30 capsule, Refills: 0       Allergies  Allergen Reactions  . Colchicine Diarrhea   Follow-up Information    Lyda Jester, PA-C Follow up on 03/25/2017.   Specialties:  Cardiology, Radiology Why:  Cardiology Hospital Follow-Up on 03/25/2017 at 11:00AM.  (Dr. Hassell Done Physician Assistant) Contact information: [address] [place] [address] [phone]        Gara Kroner, MD. Schedule an appointment as soon as possible for a visit in 1 week(s).   Specialty:  Family Medicine Contact information: [address] [place] [address] [phone]            The results of significant diagnostics from this hospitalization (including imaging, microbiology, ancillary and laboratory) are listed below for reference.    Significant Diagnostic Studies: Dg Chest 2 View  Result Date: 03/13/2017 CLINICAL DATA:  Shortness of breath, improved.  Ex-smoker. EXAM: CHEST  2 VIEW COMPARISON:  03/12/2017. FINDINGS: Increased airspace opacity throughout the left lung and in the right lower lung zone. Mildly progressive prominence of the interstitial markings. Moderately large hiatal hernia. Mediastinal surgical clips.  The airspace opacity on the left is obscuring the heart borders. Right diaphragmatic eventration. Diffuse osteopenia. IMPRESSION: 1. Progressive changes of congestive heart failure. 2. Moderately large hiatal hernia. Electronically Signed   By: Claudie Revering M.D.   On: 03/13/2017 10:51   Dg Chest 2 View  Result Date: 03/12/2017 CLINICAL DATA:  Low O2 sats. Prior left upper lobectomy. Shortness of breath. EXAM: CHEST  2 VIEW COMPARISON:  02/24/2017 FINDINGS: Postoperative changes in the left upper lung. Diffuse airspace disease and interstitial prominence throughout both lungs. This could represent edema or infection. Mild cardiomegaly. Large hiatal hernia. No effusions. IMPRESSION: Diffuse interstitial and alveolar opacities throughout the lungs which could reflect edema or infection. Large hiatal hernia. Electronically Signed   By: Rolm Baptise M.D.   On: 03/12/2017 12:27   Dg Chest 2 View  Result Date: 02/24/2017 CLINICAL DATA:  Status post left upper lobectomy on February 01, 2017 for squamous cell  malignancy. No current chest complaints. History of asthma and atrial fibrillation, coronary artery disease. No current complaints. EXAM: CHEST  2 VIEW COMPARISON:  Chest x-ray of February 15, 2017 and CT scan of the chest of the same day. FINDINGS: The right lung is adequately inflated. Prominent elevation of a portion of the right hemidiaphragm is stable. On the left there are post upper lobectomy changes. There is no alveolar infiltrate. There is no significant pleural effusion and no pneumothorax. The heart is normal in size. There is calcification in the wall of the aortic arch. The pulmonary vascularity is nearly normal. There is a moderate-sized hiatal hernia. IMPRESSION: Further interval improvement in the appearance of the chest with improved aeration and decreased pulmonary interstitial edema. Persistent large hiatal hernia. Thoracic aortic atherosclerosis. Electronically Signed   By: David  Martinique M.D.   On: 02/24/2017 15:47   Ct Angio Chest Pe W Or Wo Contrast  Result Date: 03/14/2017 CLINICAL DATA:  71 year old male with hypoxia and shortness of breath. History of AFib. History of known cancer and left upper lobectomy. EXAM: CT ANGIOGRAPHY CHEST WITH CONTRAST TECHNIQUE: Multidetector CT imaging of the chest was performed using the standard protocol during bolus administration of intravenous contrast. Multiplanar CT image reconstructions and MIPs were obtained to evaluate the vascular anatomy. CONTRAST:  100 cc Isovue 370 COMPARISON:  Chest CT dated 02/15/2017 FINDINGS: Cardiovascular: There is mild cardiomegaly. There is dilatation of the atria. There is reflux of contrast from the right atrium into the IVC consistent with a degree of right cardiac dysfunction. Correlation with echocardiogram recommended. There is no pericardial effusion. There is mild atherosclerotic calcification of the thoracic aorta. The aorta is otherwise unremarkable for the degree of enhancement. There is no CT evidence of  pulmonary embolus. Mediastinum/Nodes: Mildly enlarged right hilar lymph nodes measuring 15 mm in short axis. There is no mediastinal adenopathy. There is a moderate size hiatal hernia containing portion of the stomach. The esophagus is grossly unremarkable. Lungs/Pleura: There is postsurgical changes are left upper lobectomy. Persistent small left pleural effusion versus pleural thickening similar to prior CT. There is background of chronic parenchymal disease with subpleural blebs primarily in the right upper and right lower lobe. There is diffuse ground-glass density throughout the lungs with patchy areas of airspace opacity in the right upper lobe and superior portion of the left lung as well as bilateral patchy airspace disease involving the lung bases which are new compared to prior study and most consistent with superimposed pneumonia. Bilateral peribronchial thickening noted. There is no pneumothorax. The central  airways are patent. Upper Abdomen: Moderate size hiatal hernia. Nonobstructing bilateral renal calculi measuring up to 6 mm in the interpolar aspect of the left kidney. Colonic diverticulosis. Moderate size hiatal hernia. Musculoskeletal: No chest wall abnormality. No acute or significant osseous findings. Review of the MIP images confirms the above findings. IMPRESSION: 1. No CT evidence of pulmonary embolism. 2. Diffuse patchy airspace disease, new from prior study and most compatible with pneumonia. Clinical correlation and follow-up to resolution recommended. There is a background of chronic lung disease. 3. Postsurgical changes of left upper lobectomy with persistent small left pleural effusion versus pleural thickening. 4. Mild cardiomegaly with evidence of right cardiac dysfunction. Correlation with echocardiogram recommended. Electronically Signed   By: Anner Crete M.D.   On: 03/14/2017 18:10   Dg Chest Port 1v Same Day  Result Date: 03/14/2017 CLINICAL DATA:  Dyspnea.  Previous left  lobectomy for lung cancer. EXAM: PORTABLE CHEST 1 VIEW COMPARISON:  03/13/2017 and 03/12/2017 as well as 02/15/2017 FINDINGS: Lungs are somewhat hypoinflated and demonstrate postsurgical changes of the left lung/ hemithorax compatible previous lobectomy for lung cancer. There is mild overall worsening a path hazy opacification over the left lung as well as slight worsening mixed interstitial airspace density over the right upper lobe as cannot exclude developing infection. No definite effusion. Remainder the exam is unchanged. IMPRESSION: Slight interval worsening opacification over the left lung on background of postsurgical change as well as mild worsen mixed interstitial airspace density over the right upper lobe as cannot exclude developing infection. Electronically Signed   By: Marin Olp M.D.   On: 03/14/2017 08:38    Microbiology: Recent Results (from the past 240 hour(s))  MRSA PCR Screening     Status: None   Collection Time: 03/14/17 10:37 AM  Result Value Ref Range Status   MRSA by PCR NEGATIVE NEGATIVE Final    Comment:        The GeneXpert MRSA Assay (FDA approved for NASAL specimens only), is one component of a comprehensive MRSA colonization surveillance program. It is not intended to diagnose MRSA infection nor to guide or monitor treatment for MRSA infections.      Labs: Basic Metabolic Panel:  Recent Labs Lab 03/12/17 1149 03/13/17 0503 03/14/17 0510 03/14/17 0820 03/14/17 1127 03/15/17 0458 03/16/17 0342 03/17/17 0604 03/18/17 0035  NA 136 137 133*  --   --  133* 134* 134* 134*  K 3.4* 3.4* 2.8*  --  3.3* 3.8 3.4* 4.0 4.1  CL 99* 93* 87*  --   --  90* 87* 87* 91*  CO2 25 35* 35*  --   --  32 35* 37* 33*  GLUCOSE 131* 112* 110*  --   --  105* 115* 112* 113*  BUN '12 14 16  '$ --   --  '18 16 15 18  '$ CREATININE 1.03 1.05 1.02  --   --  1.00 0.99 1.05 1.07  CALCIUM 8.6* 8.7* 8.8*  --   --  8.7* 8.7* 9.1 9.0  MG 1.5* 2.2  --  1.8  --   --   --   --   --     Liver Function Tests: No results for input(s): AST, ALT, ALKPHOS, BILITOT, PROT, ALBUMIN in the last 168 hours. No results for input(s): LIPASE, AMYLASE in the last 168 hours. No results for input(s): AMMONIA in the last 168 hours. CBC:  Recent Labs Lab 03/12/17 1149 03/13/17 0503 03/15/17 0458 03/16/17 0342 03/17/17 0604 03/18/17 0035  WBC  9.7 6.9 8.3 8.2 9.4 9.1  NEUTROABS 6.6  --   --   --   --   --   HGB 9.9* 10.1* 10.8* 10.5* 11.3* 10.4*  HCT 31.2* 31.3* 33.1* 33.1* 36.1* 32.3*  MCV 89.9 89.2 88.3 88.0 89.4 88.5  PLT 321 300 388 422* 498* 463*   Cardiac Enzymes:  Recent Labs Lab 03/14/17 0820  TROPONINI 0.04*   BNP: BNP (last 3 results)  Recent Labs  02/15/17 0345 03/12/17 1317  BNP 984.2* 609.6*    ProBNP (last 3 results) No results for input(s): PROBNP in the last 8760 hours.  CBG: No results for input(s): GLUCAP in the last 168 hours.     SignedDomenic Polite MD.  [facility] 03/18/2017, 11:13 AM

## 2017-03-18 NOTE — Progress Notes (Signed)
Pt slept well overnight, Vitals stable, no any complain of SOB and pain, is in moderate fall risk, aware of safety precaution, will continue to monitor the patient   Palma Holter, RN

## 2017-03-18 NOTE — Care Management Note (Signed)
Case Management Note  Patient Details  Name: Paul Valdez MRN: [id number] Date of Birth: Oct 21, 1946  Action/Plan: Patient is active with [facility] for [facility] and PT as prior to admission. [facility] called and made aware of discharge home today; patient has home oxygen through [facility].  Expected Discharge Date:  03/18/17               Expected Discharge Plan:  [place]    Discharge planning Services  CM Consult  HH Arranged:  RN, PT [facility] Agency:   [place]  Status of Service:  In process, will continue to follow  Sherrilyn Rist [phone] 03/18/2017, 11:39 AM

## 2017-03-18 NOTE — Telephone Encounter (Signed)
New message      TOC appt on 3-29 with Paul Valdez per [place]

## 2017-03-19 ENCOUNTER — Emergency Department (HOSPITAL_COMMUNITY): Payer: Medicare Other

## 2017-03-19 ENCOUNTER — Inpatient Hospital Stay (HOSPITAL_COMMUNITY)
Admission: EM | Admit: 2017-03-19 | Discharge: 2017-03-28 | DRG: 291 | Disposition: E | Payer: Medicare Other | Attending: Internal Medicine | Admitting: Internal Medicine

## 2017-03-19 ENCOUNTER — Encounter (HOSPITAL_COMMUNITY): Payer: Self-pay | Admitting: *Deleted

## 2017-03-19 DIAGNOSIS — J690 Pneumonitis due to inhalation of food and vomit: Secondary | ICD-10-CM | POA: Diagnosis present

## 2017-03-19 DIAGNOSIS — R0609 Other forms of dyspnea: Secondary | ICD-10-CM | POA: Diagnosis not present

## 2017-03-19 DIAGNOSIS — I2510 Atherosclerotic heart disease of native coronary artery without angina pectoris: Secondary | ICD-10-CM | POA: Diagnosis present

## 2017-03-19 DIAGNOSIS — Z7901 Long term (current) use of anticoagulants: Secondary | ICD-10-CM | POA: Diagnosis not present

## 2017-03-19 DIAGNOSIS — Z888 Allergy status to other drugs, medicaments and biological substances status: Secondary | ICD-10-CM | POA: Diagnosis not present

## 2017-03-19 DIAGNOSIS — Y95 Nosocomial condition: Secondary | ICD-10-CM | POA: Diagnosis present

## 2017-03-19 DIAGNOSIS — Z85118 Personal history of other malignant neoplasm of bronchus and lung: Secondary | ICD-10-CM

## 2017-03-19 DIAGNOSIS — Z8249 Family history of ischemic heart disease and other diseases of the circulatory system: Secondary | ICD-10-CM

## 2017-03-19 DIAGNOSIS — I5033 Acute on chronic diastolic (congestive) heart failure: Secondary | ICD-10-CM | POA: Diagnosis present

## 2017-03-19 DIAGNOSIS — J9621 Acute and chronic respiratory failure with hypoxia: Secondary | ICD-10-CM | POA: Diagnosis present

## 2017-03-19 DIAGNOSIS — N179 Acute kidney failure, unspecified: Secondary | ICD-10-CM | POA: Diagnosis present

## 2017-03-19 DIAGNOSIS — G8929 Other chronic pain: Secondary | ICD-10-CM | POA: Diagnosis present

## 2017-03-19 DIAGNOSIS — R0600 Dyspnea, unspecified: Secondary | ICD-10-CM | POA: Diagnosis present

## 2017-03-19 DIAGNOSIS — Z66 Do not resuscitate: Secondary | ICD-10-CM | POA: Diagnosis present

## 2017-03-19 DIAGNOSIS — K219 Gastro-esophageal reflux disease without esophagitis: Secondary | ICD-10-CM | POA: Diagnosis present

## 2017-03-19 DIAGNOSIS — R0602 Shortness of breath: Secondary | ICD-10-CM

## 2017-03-19 DIAGNOSIS — I739 Peripheral vascular disease, unspecified: Secondary | ICD-10-CM | POA: Diagnosis present

## 2017-03-19 DIAGNOSIS — J9690 Respiratory failure, unspecified, unspecified whether with hypoxia or hypercapnia: Secondary | ICD-10-CM | POA: Diagnosis present

## 2017-03-19 DIAGNOSIS — Z87891 Personal history of nicotine dependence: Secondary | ICD-10-CM

## 2017-03-19 DIAGNOSIS — Z800 Family history of malignant neoplasm of digestive organs: Secondary | ICD-10-CM

## 2017-03-19 DIAGNOSIS — C3412 Malignant neoplasm of upper lobe, left bronchus or lung: Secondary | ICD-10-CM | POA: Diagnosis not present

## 2017-03-19 DIAGNOSIS — Z7189 Other specified counseling: Secondary | ICD-10-CM | POA: Diagnosis not present

## 2017-03-19 DIAGNOSIS — H409 Unspecified glaucoma: Secondary | ICD-10-CM | POA: Diagnosis present

## 2017-03-19 DIAGNOSIS — G4730 Sleep apnea, unspecified: Secondary | ICD-10-CM | POA: Diagnosis present

## 2017-03-19 DIAGNOSIS — M1990 Unspecified osteoarthritis, unspecified site: Secondary | ICD-10-CM | POA: Diagnosis present

## 2017-03-19 DIAGNOSIS — C3492 Malignant neoplasm of unspecified part of left bronchus or lung: Secondary | ICD-10-CM | POA: Diagnosis present

## 2017-03-19 DIAGNOSIS — J189 Pneumonia, unspecified organism: Secondary | ICD-10-CM | POA: Diagnosis present

## 2017-03-19 DIAGNOSIS — M545 Low back pain: Secondary | ICD-10-CM | POA: Diagnosis present

## 2017-03-19 DIAGNOSIS — J449 Chronic obstructive pulmonary disease, unspecified: Secondary | ICD-10-CM | POA: Diagnosis present

## 2017-03-19 DIAGNOSIS — R627 Adult failure to thrive: Secondary | ICD-10-CM | POA: Diagnosis present

## 2017-03-19 DIAGNOSIS — I480 Paroxysmal atrial fibrillation: Secondary | ICD-10-CM | POA: Diagnosis present

## 2017-03-19 DIAGNOSIS — Z9049 Acquired absence of other specified parts of digestive tract: Secondary | ICD-10-CM

## 2017-03-19 DIAGNOSIS — I5031 Acute diastolic (congestive) heart failure: Secondary | ICD-10-CM | POA: Diagnosis present

## 2017-03-19 DIAGNOSIS — Z902 Acquired absence of lung [part of]: Secondary | ICD-10-CM | POA: Diagnosis not present

## 2017-03-19 DIAGNOSIS — Z515 Encounter for palliative care: Secondary | ICD-10-CM | POA: Diagnosis present

## 2017-03-19 DIAGNOSIS — E785 Hyperlipidemia, unspecified: Secondary | ICD-10-CM | POA: Diagnosis present

## 2017-03-19 DIAGNOSIS — Z79899 Other long term (current) drug therapy: Secondary | ICD-10-CM | POA: Diagnosis not present

## 2017-03-19 DIAGNOSIS — C3490 Malignant neoplasm of unspecified part of unspecified bronchus or lung: Secondary | ICD-10-CM | POA: Diagnosis present

## 2017-03-19 DIAGNOSIS — I110 Hypertensive heart disease with heart failure: Principal | ICD-10-CM | POA: Diagnosis present

## 2017-03-19 DIAGNOSIS — Z9981 Dependence on supplemental oxygen: Secondary | ICD-10-CM

## 2017-03-19 DIAGNOSIS — R748 Abnormal levels of other serum enzymes: Secondary | ICD-10-CM | POA: Diagnosis present

## 2017-03-19 DIAGNOSIS — R0902 Hypoxemia: Secondary | ICD-10-CM

## 2017-03-19 LAB — CBC
HCT: 33 % — ABNORMAL LOW (ref 39.0–52.0)
Hemoglobin: 10.6 g/dL — ABNORMAL LOW (ref 13.0–17.0)
MCH: 27.8 pg (ref 26.0–34.0)
MCHC: 32.1 g/dL (ref 30.0–36.0)
MCV: 86.6 fL (ref 78.0–100.0)
Platelets: 553 10*3/uL — ABNORMAL HIGH (ref 150–400)
RBC: 3.81 MIL/uL — ABNORMAL LOW (ref 4.22–5.81)
RDW: 15.3 % (ref 11.5–15.5)
WBC: 16.2 10*3/uL — ABNORMAL HIGH (ref 4.0–10.5)

## 2017-03-19 LAB — HEPATIC FUNCTION PANEL
ALBUMIN: 2.5 g/dL — AB (ref 3.5–5.0)
ALT: 26 U/L (ref 17–63)
AST: 35 U/L (ref 15–41)
Alkaline Phosphatase: 93 U/L (ref 38–126)
BILIRUBIN DIRECT: 0.1 mg/dL (ref 0.1–0.5)
Indirect Bilirubin: 0.5 mg/dL (ref 0.3–0.9)
Total Bilirubin: 0.6 mg/dL (ref 0.3–1.2)
Total Protein: 6.7 g/dL (ref 6.5–8.1)

## 2017-03-19 LAB — URINALYSIS, ROUTINE W REFLEX MICROSCOPIC
BACTERIA UA: NONE SEEN
BILIRUBIN URINE: NEGATIVE
GLUCOSE, UA: NEGATIVE mg/dL
KETONES UR: NEGATIVE mg/dL
Nitrite: NEGATIVE
PH: 5 (ref 5.0–8.0)
PROTEIN: NEGATIVE mg/dL
SQUAMOUS EPITHELIAL / LPF: NONE SEEN
Specific Gravity, Urine: 1.015 (ref 1.005–1.030)

## 2017-03-19 LAB — BASIC METABOLIC PANEL
ANION GAP: 14 (ref 5–15)
BUN: 23 mg/dL — ABNORMAL HIGH (ref 6–20)
CALCIUM: 9 mg/dL (ref 8.9–10.3)
CO2: 28 mmol/L (ref 22–32)
Chloride: 88 mmol/L — ABNORMAL LOW (ref 101–111)
Creatinine, Ser: 1.42 mg/dL — ABNORMAL HIGH (ref 0.61–1.24)
GFR calc Af Amer: 56 mL/min — ABNORMAL LOW (ref >60)
GFR, EST NON AFRICAN AMERICAN: 48 mL/min — AB (ref >60)
GLUCOSE: 131 mg/dL — AB (ref 65–99)
Potassium: 4.1 mmol/L (ref 3.5–5.1)
Sodium: 130 mmol/L — ABNORMAL LOW (ref 135–145)

## 2017-03-19 LAB — TROPONIN I: Troponin I: 0.33 ng/mL (ref <0.03)

## 2017-03-19 LAB — BRAIN NATRIURETIC PEPTIDE: B Natriuretic Peptide: 220.5 pg/mL — ABNORMAL HIGH (ref 0.0–100.0)

## 2017-03-19 MED ORDER — POTASSIUM CHLORIDE CRYS ER 20 MEQ PO TBCR
40.0000 meq | EXTENDED_RELEASE_TABLET | Freq: Every day | ORAL | Status: DC
  Administered 2017-03-21 – 2017-03-22 (×2): 40 meq via ORAL
  Filled 2017-03-19 (×3): qty 2

## 2017-03-19 MED ORDER — DEXTROSE 5 % IV SOLN
1.0000 g | Freq: Two times a day (BID) | INTRAVENOUS | Status: DC
  Administered 2017-03-19: 1 g via INTRAVENOUS
  Filled 2017-03-19 (×2): qty 1

## 2017-03-19 MED ORDER — PANTOPRAZOLE SODIUM 40 MG PO TBEC
40.0000 mg | DELAYED_RELEASE_TABLET | Freq: Every day | ORAL | Status: DC
  Administered 2017-03-21 – 2017-03-22 (×2): 40 mg via ORAL
  Filled 2017-03-19 (×3): qty 1

## 2017-03-19 MED ORDER — VANCOMYCIN HCL IN DEXTROSE 750-5 MG/150ML-% IV SOLN
750.0000 mg | Freq: Two times a day (BID) | INTRAVENOUS | Status: DC
  Administered 2017-03-20 – 2017-03-22 (×5): 750 mg via INTRAVENOUS
  Filled 2017-03-19 (×7): qty 150

## 2017-03-19 MED ORDER — DILTIAZEM HCL ER COATED BEADS 180 MG PO CP24
180.0000 mg | ORAL_CAPSULE | Freq: Every day | ORAL | Status: DC
  Administered 2017-03-21 – 2017-03-22 (×2): 180 mg via ORAL
  Filled 2017-03-19 (×3): qty 1

## 2017-03-19 MED ORDER — IPRATROPIUM-ALBUTEROL 0.5-2.5 (3) MG/3ML IN SOLN
3.0000 mL | RESPIRATORY_TRACT | Status: DC | PRN
  Administered 2017-03-22: 3 mL via RESPIRATORY_TRACT
  Filled 2017-03-19: qty 3

## 2017-03-19 MED ORDER — ATORVASTATIN CALCIUM 40 MG PO TABS
40.0000 mg | ORAL_TABLET | Freq: Every evening | ORAL | Status: DC
  Administered 2017-03-19 – 2017-03-21 (×3): 40 mg via ORAL
  Filled 2017-03-19 (×3): qty 1

## 2017-03-19 MED ORDER — SERTRALINE HCL 100 MG PO TABS
100.0000 mg | ORAL_TABLET | Freq: Every day | ORAL | Status: DC
Start: 2017-03-20 — End: 2017-03-22
  Administered 2017-03-21 – 2017-03-22 (×2): 100 mg via ORAL
  Filled 2017-03-19 (×3): qty 1

## 2017-03-19 MED ORDER — FLECAINIDE ACETATE 100 MG PO TABS
100.0000 mg | ORAL_TABLET | Freq: Two times a day (BID) | ORAL | Status: DC
  Administered 2017-03-19 – 2017-03-22 (×5): 100 mg via ORAL
  Filled 2017-03-19 (×7): qty 1

## 2017-03-19 MED ORDER — ACETAMINOPHEN 325 MG PO TABS: 650.0000 mg | ORAL_TABLET | Freq: Four times a day (QID) | ORAL | Status: DC | PRN

## 2017-03-19 MED ORDER — METHYLPREDNISOLONE SODIUM SUCC 40 MG IJ SOLR
40.0000 mg | Freq: Four times a day (QID) | INTRAMUSCULAR | Status: DC
  Administered 2017-03-20 – 2017-03-21 (×6): 40 mg via INTRAVENOUS
  Filled 2017-03-19 (×6): qty 1

## 2017-03-19 MED ORDER — FUROSEMIDE 10 MG/ML IJ SOLN
80.0000 mg | Freq: Once | INTRAMUSCULAR | Status: AC
  Administered 2017-03-19: 80 mg via INTRAVENOUS
  Filled 2017-03-19: qty 8

## 2017-03-19 MED ORDER — APIXABAN 5 MG PO TABS
5.0000 mg | ORAL_TABLET | Freq: Two times a day (BID) | ORAL | Status: DC
  Administered 2017-03-19 – 2017-03-20 (×2): 5 mg via ORAL
  Filled 2017-03-19 (×3): qty 1

## 2017-03-19 MED ORDER — GABAPENTIN 300 MG PO CAPS
600.0000 mg | ORAL_CAPSULE | Freq: Three times a day (TID) | ORAL | Status: DC
  Administered 2017-03-20 – 2017-03-22 (×7): 600 mg via ORAL
  Filled 2017-03-19 (×8): qty 2

## 2017-03-19 MED ORDER — TAMSULOSIN HCL 0.4 MG PO CAPS
0.4000 mg | ORAL_CAPSULE | Freq: Every day | ORAL | Status: DC
  Administered 2017-03-20 – 2017-03-21 (×3): 0.4 mg via ORAL
  Filled 2017-03-19 (×3): qty 1

## 2017-03-19 MED ORDER — VANCOMYCIN HCL 10 G IV SOLR
1250.0000 mg | Freq: Once | INTRAVENOUS | Status: AC
  Administered 2017-03-20: 1250 mg via INTRAVENOUS
  Filled 2017-03-19: qty 1250

## 2017-03-19 NOTE — ED Notes (Signed)
Dr. Tamera Punt at bedside at this time.

## 2017-03-19 NOTE — H&P (Signed)
History and Physical    Paul Valdez:[id number] DOB: 27-Sep-1946 DOA: 02/26/2017  PCP: Gara Kroner, MD   Patient coming from: Home  Chief Complaint: DOE, generalized weakness, debility  HPI: Paul Valdez is a 71 y.o. gentleman with a history of COPD, chronic respiratory failure on home oxygen 3-4L [place] continuous at baseline, CAD, HTN, HLD, lung cancer S/P left upper lobectomy in February 3570, diastolic CHF (LV EF 17-79%), and atrial fibrillation S/P cardioversion on 03/16/2017 (CHADS-Vasc at least 3, anticoagulated with Eliquis) who was just discharged on yesterday after being admitted for management of HCAP, CHF exacerbation, and a fib with RVR.  He has had DOE since he left the hospital.  He has had occasional wheezing.  He is debilitated with bilateral leg weakness ("my legs give out when I try to walk") and light-headedness when he stands.  No LOC.  No chest pain, pressure, or tightness.  No nausea or vomiting.  He thinks he has gained 5 lbs in the past 24 hours.  No headache.  No dysuria but urine is more concentrated.  No urine odor.  He has chronic low back pain.  Patient was evaluated by the home health nurse this afternoon and was referred directly to the OR for O2 sats in the 70's on his baseline oxygen of 3-4L [place].  ED Course: The patient was escalated quickly to a 100% NRB but was then weaned to a 55% venti-mask with target O2 sats 88-92% (this is his home goal, per RT).  He has received cefepime and vanc for HCAP coverage.  He has received lasix 46m IV x one.  Hospitalist asked to admit.  Troponin 0.33, up from 0.04 five days ago.  EKG shows NSR.  BUN and Creatinine now elevated since yesterday, 23 and 1.42 respectively.  WBC count 16.2.  Hgb 10.  BNP actually down slightly at 220.    Review of Systems: As per HPI otherwise 10 systems reviewed and negative except as stated in the HPI.   Past Medical History:  Diagnosis Date  . Acute respiratory failure ([place]   .  Arthritis   . Asthma   . Atrial fibrillation ([place]   . Atypical chest pain    a. Normal nuc 2014.  . Carotid artery disease ([place]    a. Carotid duplex 2015: 439-03%RICA, 10-09%LICA.   .Marland KitchenCataract   . CHF (congestive heart failure) ([place]   . Coronary artery calcification seen on CT scan   . Depression   . Detached retina   . Former consumption of alcohol   . Former tobacco use   . GERD (gastroesophageal reflux disease)    "I take heart burn medicine"  . Glaucoma   . Hemorrhoids 03/09/2017  . Hyperlipemia   . Hypertension   . Low back pain 12/29/2016  . Mass of upper lobe of left lung 12/29/2016  . PVD (peripheral vascular disease) ([place]    a. Mild plaque of iliacs in 2013 on duplex; PET 2018:  PET also corroborated coronary, aortic arch, and branch vessel atherosclerotic vascular disease as well as aortoiliac atherosclerotic vascular disease.  . Sleep apnea    has not gotten CPAP yet    Past Surgical History:  Procedure Laterality Date  . APPENDECTOMY    . CARDIOVERSION N/A 03/16/2017   Procedure: CARDIOVERSION;  Surgeon: TSueanne Margarita MD;  Location: [facility];  Service: Cardiovascular;  Laterality: N/A;  . COLONOSCOPY    . EYE SURGERY    .  FLEXIBLE BRONCHOSCOPY N/A 02/01/2017   Procedure: FLEXIBLE BRONCHOSCOPY;  Surgeon: Gaye Pollack, MD;  Location: [facility] OR;  Service: Thoracic;  Laterality: N/A;  . GANGLION CYST EXCISION     left hand  . HERNIA REPAIR     double hernia repair  . TEE WITHOUT CARDIOVERSION N/A 03/16/2017   Procedure: TRANSESOPHAGEAL ECHOCARDIOGRAM (TEE);  Surgeon: Sueanne Margarita, MD;  Location: [facility];  Service: Cardiovascular;  Laterality: N/A;  . THORACOTOMY/LOBECTOMY Left 02/01/2017   Procedure: THORACOTOMY/LEFT UPPER LOBECTOMY;  Surgeon: Gaye Pollack, MD;  Location: [facility];  Service: Thoracic;  Laterality: Left;  . TUMOR REMOVAL     non cancer tumor from neck     reports that he has quit smoking. He has never used smokeless tobacco. He reports that  he does not drink alcohol or use drugs.  He is not married and does not have any adult children.  A close friend is his healthcare POA. "I have outlived all of my family."  Allergies  Allergen Reactions  . Colchicine Diarrhea    Family History  Problem Relation Age of Onset  . Colon cancer Father   . Hypertension Father   . Hypertension Mother   . Colon cancer Sister   . Colon cancer Brother      Prior to Admission medications   Medication Sig Start Date End Date Taking? Authorizing Provider  apixaban (ELIQUIS) 5 MG TABS tablet Take 1 tablet (5 mg total) by mouth 2 (two) times daily. 03/01/17  Yes Imogene Burn, PA-C  atorvastatin (LIPITOR) 40 MG tablet Take 1 tablet (40 mg total) by mouth every evening. 03/01/17  Yes Imogene Burn, PA-C  cyanocobalamin (,VITAMIN B-12,) 1000 MCG/ML injection Inject 1 mL (1,000 mcg total) into the skin every 30 (thirty) days. Please go to your Pimary MD's office for this injection 02/17/17  Yes Shanker Kristeen Mans, MD  diltiazem (CARDIZEM CD) 180 MG 24 hr capsule Take 1 capsule (180 mg total) by mouth daily. 03/01/17  Yes Imogene Burn, PA-C  flecainide (TAMBOCOR) 100 MG tablet Take 1 tablet (100 mg total) by mouth every 12 (twelve) hours. 03/18/17  Yes Domenic Polite, MD  furosemide (LASIX) 40 MG tablet Take 1 tablet (40 mg total) by mouth 2 (two) times daily. 03/18/17  Yes Domenic Polite, MD  gabapentin (NEURONTIN) 300 MG capsule Take 2 capsules (600 mg total) by mouth 3 (three) times daily. 02/17/17  Yes Shanker Kristeen Mans, MD  omeprazole (PRILOSEC) 20 MG capsule Take 1 capsule (20 mg total) by mouth every evening. 02/17/17  Yes Shanker Kristeen Mans, MD  Potassium Chloride ER 20 MEQ TBCR Take 40 mEq by mouth 2 (two) times daily. 03/18/17  Yes Domenic Polite, MD  sertraline (ZOLOFT) 100 MG tablet Take 1 tablet (100 mg total) by mouth daily. 02/17/17  Yes Shanker Kristeen Mans, MD  tamsulosin (FLOMAX) 0.4 MG CAPS capsule Take 1 capsule (0.4 mg total) by mouth daily  after supper. 02/17/17  Yes Shanker Kristeen Mans, MD  Vitamin D, Ergocalciferol, (DRISDOL) 50000 units CAPS capsule Take 1 capsule (50,000 Units total) by mouth every 7 (seven) days. Every Friday 02/17/17  Yes Shanker Kristeen Mans, MD    Physical Exam: Vitals:   03/27/2017 1906 03/20/2017 2043 03/05/2017 2100  BP: 107/63  124/65  Pulse: 89 80 80  Resp: _0 Temp: 98.2 F (36.8 C)    TempSrc: Oral    SpO2: (!) 84% 91% (!) 89%  Weight: 85.4 kg (188 lb  4 oz)    Height: _0  (1.702 m)        Constitutional: NAD, calm, chronically ill appearing, mild tachypnea but he is not decompensating. Vitals:   03/08/2017 1906 03/08/2017 2043 02/26/2017 2100  BP: 107/63  124/65  Pulse: 89 80 80  Resp: _1 Temp: 98.2 F (36.8 C)    TempSrc: Oral    SpO2: (!) 84% 91% (!) 89%  Weight: 85.4 kg (188 lb 4 oz)    Height: _2  (1.702 m)     Eyes: PERRL, lids and conjunctivae normal ENMT: Deferred because patient is requiring venti-mask to maintain O2 sats Neck: normal appearance, supple, no masses Respiratory: Bibasilar crackles.  No wheezing.  Mildly tachypnic.  No accessory muscle use.  Cardiovascular: Normal rate, regular rhythm, no murmurs / rubs / gallops. No extremity edema. 2+ pedal pulses. GI: abdomen is obese but soft and compressible.  No distention.  Bowel sounds are present. Musculoskeletal:  No joint deformity in upper and lower extremities. Good ROM, no contractures. Normal muscle tone.  Skin: no rashes, warm and dry Neurologic: CN grossly intact.  Generalized weakness.  No focal deficits. Psychiatric: Normal judgment and insight. Alert and oriented x 3. Normal mood.     Labs on Admission: I have personally reviewed following labs and imaging studies  CBC:  Recent Labs Lab 03/15/17 0458 03/16/17 0342 03/17/17 0604 03/18/17 0035 03/09/2017 1914  WBC 8.3 8.2 9.4 9.1 16.2*  HGB 10.8* 10.5* 11.3* 10.4* 10.6*  HCT 33.1* 33.1* 36.1* 32.3* 33.0*  MCV 88.3 88.0 89.4 88.5 86.6  PLT  388 422* 498* 463* 160*   Basic Metabolic Panel:  Recent Labs Lab 03/13/17 0503  03/14/17 0820  03/15/17 0458 03/16/17 0342 03/17/17 0604 03/18/17 0035 03/06/2017 1914  NA 137  < >  --   --  133* 134* 134* 134* 130*  K 3.4*  < >  --   < > 3.8 3.4* 4.0 4.1 4.1  CL 93*  < >  --   --  90* 87* 87* 91* 88*  CO2 35*  < >  --   --  32 35* 37* 33* 28  GLUCOSE 112*  < >  --   --  105* 115* 112* 113* 131*  BUN 14  < >  --   --  _3 23*  CREATININE 1.05  < >  --   --  1.00 0.99 1.05 1.07 1.42*  CALCIUM 8.7*  < >  --   --  8.7* 8.7* 9.1 9.0 9.0  MG 2.2  --  1.8  --   --   --   --   --   --   < > = values in this interval not displayed. GFR: Estimated Creatinine Clearance: 49.8 mL/min (A) (by C-G formula based on SCr of 1.42 mg/dL (H)).  Cardiac Enzymes:  Recent Labs Lab 03/14/17 0820 03/21/2017 1921  TROPONINI 0.04* 0.33*   Urine analysis: Pending  Radiological Exams on Admission: Dg Chest 2 View  Result Date: 02/26/2017 CLINICAL DATA:  Shortness of breath. Status post left upper lobectomy. EXAM: CHEST  2 VIEW COMPARISON:  Radiograph of March 14, 2017. FINDINGS: Stable cardiomediastinal silhouette. Postsurgical changes are again noted in the left hemothorax with some degree of volume loss present. Stable left upper lobe and lower lobe airspace opacities are noted concerning for pneumonia or atelectasis. No pneumothorax is noted. Stable diffuse interstitial densities noted throughout the right lung concerning for  edema or inflammation. Bony thorax is unremarkable. IMPRESSION: Stable left lung opacity is noted concerning combination of postsurgical changes as well as pneumonia or atelectasis. Stable right lung interstitial densities are also noted concerning for edema or inflammation. Electronically Signed   By: Marijo Conception, M.D.   On: 03/27/2017 20:21    EKG: Independently reviewed by me.  NSR.  No ischemic changes.  Assessment/Plan Principal Problem:   Acute on chronic  respiratory failure with hypoxia (HCC) Active Problems:   S/P lobectomy of lung   Lung cancer (HCC)   Squamous cell carcinoma lung, left (HCC)   Acute on chronic diastolic CHF (congestive heart failure) ([place])   HCAP (healthcare-associated pneumonia)   Dyspnea   Respiratory failure (HCC)      Acute on chronic hypoxic respiratory failure, likely multifactorial with underlying chronic lung disease, recent HCAP, and diastolic heart failure.  No significant wheezing on exam.  No active chest pain.  He had a CTA of his chest several days ago that did not show PE and he is anticoagulated with Eliquis. --Agree with resuming IV antibiotics for HCAP for now --Agree with IV lasix as given in the ED --Add IV solumedrol 48m q6h --Duoneb prn --ABG requested --Low threshold for BiPAP trial and pulmonary consultation --He needs a stepdown bed for now  Chronic diastolic heart failure.  Patient reports 5 lb weight gain but BNP has trended downward and BUN/Cr are up.  Clinically he does not appear grossly volume overloaded. --Agree with IV lasix x one as ordered in the ED but I will not order additional doses for now --Strict I/O --Daily weights --Monitor volume status  Elevated troponin, demand ischemia in the setting of hypoxia vs ACS.  The patient has not had chest pain. --Follow trend --Low threshold for cardiology consultation if troponin doubles tonight --He is on a statin  Atrial fibrillation, currently in NSR --Eliquis, cardizem, flecainide  AKI --U/A ordered --Follow trend, volume status       DVT prophylaxis: Anticoagulated with Eliquis Code Status: FULL Family Communication: Healthcare power of attorney present in the ED at time of admission. Disposition Plan: Expect he will need SNF or LTAC at discharge.  This is his 3rd admission since lung surgery in February. Consults called:  NONE but I suspect he will need pulmonary and cardiology input (again) Admission status:  Inpatient, stepdown unit.  I expect this patient will need inpatient services for greater than two midnights.  I would not expect discharge before Monday.   TIME SPENT: 70 minutes   CEber JonesMD [facility] Pager [phone] If 7PM-7AM, please contact night-coverage [email or web address] Password [email or web address] 02/26/2017, 9:36 PM

## 2017-03-19 NOTE — Progress Notes (Signed)
Called to room by RN for a venturi mask because the pt's sats were low. On my arrival pt's sats were 74%. Pt was placed on 100% NRB and sats returned to 99% in approx. 2 min. Pt currently on a 55% venti mask.

## 2017-03-19 NOTE — ED Notes (Signed)
The p;t is on home 02

## 2017-03-19 NOTE — Telephone Encounter (Signed)
Patient contacted regarding discharge from [facility] on 03/18/2017.  Patient understands to follow up with provider Dr. Irish Lack on 03/31/2017 at [place] at [facility].  (Pt states his original appt was with B. Rosita Fire, PA-C 03/25/17, changed to Dr. Clayton Bibles at patients request) Patient understands discharge instructions? Yes  Patient understands medications and regiment? Yes  Patient understands to bring all medications to this visit? Yes  Patient states he is doing well at this time. I advised to contact office with any issues that come up.

## 2017-03-19 NOTE — ED Triage Notes (Signed)
The pt is c/o sob all day and he has fallen several times because of sob.  He was discharged from here yesterday ??? Reason  Hx chf

## 2017-03-19 NOTE — Progress Notes (Signed)
RT note: Pt. just arrived floor 2300, ABG planned after RN/staff get pts. Foley catheter replaced, sats on Venti Mask 14L/55% 88-92%, "mild shortness of breath", per pt., RT to monitor.

## 2017-03-19 NOTE — Progress Notes (Addendum)
Pharmacy Antibiotic Note  Paul Valdez is a 71 y.o. male admitted on 03/01/2017 with pneumonia.  Pharmacy has been consulted for vancomycin dosing.  Serum creatinine is elevated above baseline, with CrCL ~49 mL/min. WBC is also elevated.  Plan: Vancomycin '1250mg'$  IV x1, then '750mg'$  IV every 12 hours Cefepime 1g IV every 12 hours per MD Monitor renal function, clinical progress, VT as indicated  Height: '5\' 7"'$  (170.2 cm) Weight: 188 lb 4 oz (85.4 kg) IBW/kg (Calculated) : 66.1  Temp (24hrs), Avg:98.2 F (36.8 C), Min:98.2 F (36.8 C), Max:98.2 F (36.8 C)   Recent Labs Lab 03/15/17 0458 03/16/17 0342 03/17/17 0604 03/18/17 0035 03/14/2017 1914  WBC 8.3 8.2 9.4 9.1 16.2*  CREATININE 1.00 0.99 1.05 1.07 1.42*    Estimated Creatinine Clearance: 49.8 mL/min (A) (by C-G formula based on SCr of 1.42 mg/dL (H)).    Allergies  Allergen Reactions  . Colchicine Diarrhea    Antimicrobials this admission: 3/23 vancomycin >>  3/23 zosyn >>   Thank you for allowing pharmacy to be a part of this patient's care.  Demetrius Charity, PharmD Acute Care Pharmacy Resident  Pager: [phone] 02/25/2017

## 2017-03-19 NOTE — ED Notes (Signed)
Patient placed on NRB by RT. Pulse ox had dropped to 76 on 5 LPM via nasal cannula.

## 2017-03-19 NOTE — ED Notes (Signed)
Hospitalist at bedside at this time 

## 2017-03-19 NOTE — ED Provider Notes (Signed)
[facility] DEPT Provider Note   CSN: [id number] Arrival date & time: 03/15/2017  0737     History   Chief Complaint Chief Complaint  Patient presents with  . Shortness of Breath    HPI Paul Valdez is a 71 y.o. male.  Patient 71 year old male with a history of severe lung disease status post left upper lobe lobectomy secondary to a mass, hypertension, hyperlipidemia, atrial fibrillation on Eliquis and diastolic heart failure who presents with worsening shortness of breath. He was just discharged from the hospital yesterday for healthcare associated pneumonia with associated fluid overload. He was in atrial fibrillation at that time and was cardioverted during the hospitalization. He was started on flecainide. He states he went home yesterday around lunch time and continued to decline. He states he's gained about 5 pounds. He is only able to walk short distances without getting severely short of breath. He is oxygen dependent and is normally on 3-4 L at home at baseline. He states if he walks short distances his oxygen level drops into the 60s. He denies any chest pain. No fevers. He has an ongoing cough. He denies any increased leg swelling.      Past Medical History:  Diagnosis Date  . Acute respiratory failure ([place])   . Arthritis   . Asthma   . Atrial fibrillation ([place])   . Atypical chest pain    a. Normal nuc 2014.  . Carotid artery disease ([place])    a. Carotid duplex 2015: 10-62% RICA, 6-94% LICA.   Marland Kitchen Cataract   . CHF (congestive heart failure) ([place])   . Coronary artery calcification seen on CT scan   . Depression   . Detached retina   . Former consumption of alcohol   . Former tobacco use   . GERD (gastroesophageal reflux disease)    "I take heart burn medicine"  . Glaucoma   . Hemorrhoids 03/09/2017  . Hyperlipemia   . Hypertension   . Low back pain 12/29/2016  . Mass of upper lobe of left lung 12/29/2016  . PVD (peripheral vascular disease) ([place])    a. Mild  plaque of iliacs in 2013 on duplex; PET 2018:  PET also corroborated coronary, aortic arch, and branch vessel atherosclerotic vascular disease as well as aortoiliac atherosclerotic vascular disease.  . Sleep apnea    has not gotten CPAP yet    Patient Active Problem List   Diagnosis Date Noted  . Respiratory failure ([place]) 03/08/2017  . Acute on chronic respiratory failure with hypoxia ([place]) 03/26/2017  . Anticoagulated   . Dyspnea   . Hypokalemia 03/14/2017  . Acute respiratory distress 03/13/2017  . Acute respiratory failure ([place]) 03/12/2017  . Anemia 03/12/2017  . Hemorrhoids 03/09/2017  . Lung cancer ([place]) 02/15/2017  . 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 Acute on chronic diastolic CHF (congestive heart failure) ([place]) 02/15/2017  . Complicated UTI (urinary tract infection) 02/15/2017  . Left rib fracture 02/15/2017  . Elevated brain natriuretic peptide (BNP) level   . HCAP (healthcare-associated pneumonia)   . Lobar pneumonia ([place])   . Persistent atrial fibrillation with rapid ventricular response ([place]) 02/12/2017  . Atelectasis   . OSA (obstructive sleep apnea)   . S/P lobectomy of lung 02/01/2017  . Coronary artery calcification seen on CT scan 01/13/2017  . PVD (peripheral vascular disease) ([place]) 01/13/2017  . Essential hypertension 01/13/2017  . Hyperlipidemia 01/13/2017  . Nodule of left lung 12/29/2016  . Low back pain 12/29/2016  .  Carotid artery disease ([place]) 11/21/2014    Past Surgical History:  Procedure Laterality Date  . APPENDECTOMY    . CARDIOVERSION N/A 03/16/2017   Procedure: CARDIOVERSION;  Surgeon: Sueanne Margarita, MD;  Location: [facility] ENDOSCOPY;  Service: Cardiovascular;  Laterality: N/A;  . COLONOSCOPY    . EYE SURGERY    . FLEXIBLE BRONCHOSCOPY N/A 02/01/2017   Procedure: FLEXIBLE BRONCHOSCOPY;  Surgeon: Gaye Pollack, MD;  Location: [facility] OR;  Service: Thoracic;  Laterality: N/A;  . GANGLION CYST EXCISION     left hand  . HERNIA  REPAIR     double hernia repair  . TEE WITHOUT CARDIOVERSION N/A 03/16/2017   Procedure: TRANSESOPHAGEAL ECHOCARDIOGRAM (TEE);  Surgeon: Sueanne Margarita, MD;  Location: [facility] ENDOSCOPY;  Service: Cardiovascular;  Laterality: N/A;  . THORACOTOMY/LOBECTOMY Left 02/01/2017   Procedure: THORACOTOMY/LEFT UPPER LOBECTOMY;  Surgeon: Gaye Pollack, MD;  Location: [facility] OR;  Service: Thoracic;  Laterality: Left;  . TUMOR REMOVAL     non cancer tumor from neck       Home Medications    Prior to Admission medications   Medication Sig Start Date End Date Taking? Authorizing Provider  apixaban (ELIQUIS) 5 MG TABS tablet Take 1 tablet (5 mg total) by mouth 2 (two) times daily. 03/01/17  Yes Imogene Burn, PA-C  atorvastatin (LIPITOR) 40 MG tablet Take 1 tablet (40 mg total) by mouth every evening. 03/01/17  Yes Imogene Burn, PA-C  cyanocobalamin (,VITAMIN B-12,) 1000 MCG/ML injection Inject 1 mL (1,000 mcg total) into the skin every 30 (thirty) days. Please go to your [place] MD's office for this injection 02/17/17  Yes Shanker Kristeen Mans, MD  diltiazem (CARDIZEM CD) 180 MG 24 hr capsule Take 1 capsule (180 mg total) by mouth daily. 03/01/17  Yes Imogene Burn, PA-C  flecainide (TAMBOCOR) 100 MG tablet Take 1 tablet (100 mg total) by mouth every 12 (twelve) hours. 03/18/17  Yes Domenic Polite, MD  furosemide (LASIX) 40 MG tablet Take 1 tablet (40 mg total) by mouth 2 (two) times daily. 03/18/17  Yes Domenic Polite, MD  gabapentin (NEURONTIN) 300 MG capsule Take 2 capsules (600 mg total) by mouth 3 (three) times daily. 02/17/17  Yes Shanker Kristeen Mans, MD  omeprazole (PRILOSEC) 20 MG capsule Take 1 capsule (20 mg total) by mouth every evening. 02/17/17  Yes Shanker Kristeen Mans, MD  Potassium Chloride ER 20 MEQ TBCR Take 40 mEq by mouth 2 (two) times daily. 03/18/17  Yes Domenic Polite, MD  sertraline (ZOLOFT) 100 MG tablet Take 1 tablet (100 mg total) by mouth daily. 02/17/17  Yes Shanker Kristeen Mans, MD  tamsulosin (FLOMAX)  0.4 MG CAPS capsule Take 1 capsule (0.4 mg total) by mouth daily after supper. 02/17/17  Yes Shanker Kristeen Mans, MD  Vitamin D, Ergocalciferol, (DRISDOL) 50000 units CAPS capsule Take 1 capsule (50,000 Units total) by mouth every 7 (seven) days. Every Friday 02/17/17  Yes Shanker Kristeen Mans, MD    Family History Family History  Problem Relation Age of Onset  . Colon cancer Father   . Hypertension Father   . Hypertension Mother   . Colon cancer Sister   . Colon cancer Brother     Social History Social History  Substance Use Topics  . Smoking status: Former Research scientist (life sciences)  . Smokeless tobacco: Never Used     Comment: QUIT SMOKING IN THE LATE 90'S  . Alcohol use No     Allergies   Colchicine   Review of  Systems Review of Systems  Constitutional: Positive for fatigue. Negative for chills, diaphoresis and fever.  HENT: Negative for congestion, rhinorrhea and sneezing.   Eyes: Negative.   Respiratory: Positive for cough and shortness of breath. Negative for chest tightness.   Cardiovascular: Negative for chest pain and leg swelling.  Gastrointestinal: Negative for abdominal pain, blood in stool, diarrhea, nausea and vomiting.  Genitourinary: Negative for difficulty urinating, flank pain, frequency and hematuria.  Musculoskeletal: Negative for arthralgias and back pain.  Skin: Negative for rash.  Neurological: Negative for dizziness, speech difficulty, weakness, numbness and headaches.     Physical Exam Updated Vital Signs BP 131/80 (BP Location: Right Arm)   Pulse 77   Temp 98.7 F (37.1 C) (Axillary)   Resp 16   Ht _0  (1.702 m)   Wt 188 lb 4 oz (85.4 kg)   SpO2 93%   BMI 29.48 kg/m   Physical Exam  Constitutional: He is oriented to person, place, and time. He appears well-developed and well-nourished.  HENT:  Head: Normocephalic and atraumatic.  Eyes: Pupils are equal, round, and reactive to light.  Neck: Normal range of motion. Neck supple.  Cardiovascular: Normal  rate, regular rhythm and normal heart sounds.   Pulmonary/Chest: Effort normal. Tachypnea noted. No respiratory distress. He has no wheezes. He has rales. He exhibits no tenderness.  Abdominal: Soft. Bowel sounds are normal. There is no tenderness. There is no rebound and no guarding.  Musculoskeletal: Normal range of motion. He exhibits no edema.  Lymphadenopathy:    He has no cervical adenopathy.  Neurological: He is alert and oriented to person, place, and time.  Skin: Skin is warm and dry. No rash noted.  Psychiatric: He has a normal mood and affect.     ED Treatments / Results  Labs (all labs ordered are listed, but only abnormal results are displayed) Labs Reviewed  BASIC METABOLIC PANEL - Abnormal; Notable for the following:       Result Value   Sodium 130 (*)    Chloride 88 (*)    Glucose, Bld 131 (*)    BUN 23 (*)    Creatinine, Ser 1.42 (*)    GFR calc non Af Amer 48 (*)    GFR calc Af Amer 56 (*)    All other components within normal limits  CBC - Abnormal; Notable for the following:    WBC 16.2 (*)    RBC 3.81 (*)    Hemoglobin 10.6 (*)    HCT 33.0 (*)    Platelets 553 (*)    All other components within normal limits  BRAIN NATRIURETIC PEPTIDE - Abnormal; Notable for the following:    B Natriuretic Peptide 220.5 (*)    All other components within normal limits  TROPONIN I - Abnormal; Notable for the following:    Troponin I 0.33 (*)    All other components within normal limits  HEPATIC FUNCTION PANEL - Abnormal; Notable for the following:    Albumin 2.5 (*)    All other components within normal limits  URINALYSIS, ROUTINE W REFLEX MICROSCOPIC - Abnormal; Notable for the following:    APPearance HAZY (*)    Hgb urine dipstick LARGE (*)    Leukocytes, UA TRACE (*)    All other components within normal limits  CULTURE, BLOOD (ROUTINE X 2)  CULTURE, BLOOD (ROUTINE X 2)  BLOOD GAS, ARTERIAL  TROPONIN I  TROPONIN I  TROPONIN I  CBC WITH DIFFERENTIAL/PLATELET   BASIC METABOLIC PANEL  Randolm Idol, ED    EKG  EKG Interpretation  Date/Time:  Friday March 19 2017 18:58:15 EDT Ventricular Rate:  91 PR Interval:  200 QRS Duration: 96 QT Interval:  438 QTC Calculation: 538 R Axis:   83 Text Interpretation:  Normal sinus rhythm Prolonged QT Abnormal ECG since last tracing no significant change Confirmed by Tyonna Talerico  MD, Azriella Mattia ([id number]) on 03/14/2017 8:15:32 PM       Radiology Dg Chest 2 View  Result Date: 03/24/2017 CLINICAL DATA:  Shortness of breath. Status post left upper lobectomy. EXAM: CHEST  2 VIEW COMPARISON:  Radiograph of March 14, 2017. FINDINGS: Stable cardiomediastinal silhouette. Postsurgical changes are again noted in the left hemothorax with some degree of volume loss present. Stable left upper lobe and lower lobe airspace opacities are noted concerning for pneumonia or atelectasis. No pneumothorax is noted. Stable diffuse interstitial densities noted throughout the right lung concerning for edema or inflammation. Bony thorax is unremarkable. IMPRESSION: Stable left lung opacity is noted concerning combination of postsurgical changes as well as pneumonia or atelectasis. Stable right lung interstitial densities are also noted concerning for edema or inflammation. Electronically Signed   By: Marijo Conception, M.D.   On: 02/26/2017 20:21    Procedures Procedures (including critical care time)  Medications Ordered in ED Medications  ceFEPIme (MAXIPIME) 1 g in dextrose 5 % 50 mL IVPB (0 g Intravenous Stopped 03/20/2017 2235)  vancomycin (VANCOCIN) 1,250 mg in sodium chloride 0.9 % 250 mL IVPB (not administered)  vancomycin (VANCOCIN) IVPB 750 mg/150 ml premix (not administered)  flecainide (TAMBOCOR) tablet 100 mg (not administered)  potassium chloride SA (K-DUR,KLOR-CON) CR tablet 40 mEq (not administered)  apixaban (ELIQUIS) tablet 5 mg (not administered)  atorvastatin (LIPITOR) tablet 40 mg (not administered)  diltiazem (CARDIZEM  CD) 24 hr capsule 180 mg (not administered)  gabapentin (NEURONTIN) capsule 600 mg (not administered)  pantoprazole (PROTONIX) EC tablet 40 mg (not administered)  sertraline (ZOLOFT) tablet 100 mg (not administered)  tamsulosin (FLOMAX) capsule 0.4 mg (not administered)  acetaminophen (TYLENOL) tablet 650 mg (not administered)  ipratropium-albuterol (DUONEB) 0.5-2.5 (3) MG/3ML nebulizer solution 3 mL (not administered)  methylPREDNISolone sodium succinate (SOLU-MEDROL) 40 mg/mL injection 40 mg (not administered)  furosemide (LASIX) injection 80 mg (80 mg Intravenous Given 03/03/2017 2134)     Initial Impression / Assessment and Plan / ED Course  I have reviewed the triage vital signs and the nursing notes.  Pertinent labs & imaging results that were available during my care of the patient were reviewed by me and considered in my medical decision making (see chart for details).     Patient presents with worsening shortness of breath. His baseline oxygen is 3-4 L/m. When I went in the room his oxygen saturation was 72% on 4 L. I bumped him up to 5 L and it only went up to about 80%. We then placed him on a nonrebreather mass which he currently is on. He is maintaining oxygen saturations in the mid 90s on oxygen. He had a recent CT angiogram during this recent hospitalization that was negative for PE. Given this and the fact that he is on eloquent site don't feel that he needs a repeat CT tonight. He has suggestions of ongoing infiltrates which could be a combination of fluid overload as well as pneumonia. He was given dose of antibiotics in the ED as well as a dose of Lasix. His troponin is mildly elevated but he doesn't have any ischemic changes on  EKG. He denies any chest pain. I will consult the hospitalist for admission.  I spoke with Dr. Eulas Post who will admit the pt.  Final Clinical Impressions(s) / ED Diagnoses   Final diagnoses:  SOB (shortness of breath)  HCAP (healthcare-associated  pneumonia)  Acute diastolic congestive heart failure [facility])    New Prescriptions Current Discharge Medication List       Malvin Johns, MD 03/12/2017 2317

## 2017-03-19 NOTE — ED Notes (Signed)
RN notified RT of pulse ox 88-91% on ventimask. Dr. Eulas Post requested RT to reassess patient.

## 2017-03-20 ENCOUNTER — Inpatient Hospital Stay (HOSPITAL_COMMUNITY): Payer: Medicare Other

## 2017-03-20 LAB — CBC WITH DIFFERENTIAL/PLATELET
BASOS PCT: 0 %
Basophils Absolute: 0 10*3/uL (ref 0.0–0.1)
EOS ABS: 0.1 10*3/uL (ref 0.0–0.7)
EOS PCT: 1 %
HCT: 30.9 % — ABNORMAL LOW (ref 39.0–52.0)
Hemoglobin: 9.9 g/dL — ABNORMAL LOW (ref 13.0–17.0)
LYMPHS ABS: 0.8 10*3/uL (ref 0.7–4.0)
Lymphocytes Relative: 6 %
MCH: 27.8 pg (ref 26.0–34.0)
MCHC: 32 g/dL (ref 30.0–36.0)
MCV: 86.8 fL (ref 78.0–100.0)
Monocytes Absolute: 0.8 10*3/uL (ref 0.1–1.0)
Monocytes Relative: 5 %
Neutro Abs: 12.6 10*3/uL — ABNORMAL HIGH (ref 1.7–7.7)
Neutrophils Relative %: 88 %
PLATELETS: 478 10*3/uL — AB (ref 150–400)
RBC: 3.56 MIL/uL — AB (ref 4.22–5.81)
RDW: 15.3 % (ref 11.5–15.5)
WBC: 14.3 10*3/uL — AB (ref 4.0–10.5)

## 2017-03-20 LAB — TROPONIN I: TROPONIN I: 0.03 ng/mL — AB (ref <0.03)

## 2017-03-20 LAB — BLOOD GAS, ARTERIAL
Acid-Base Excess: 8.4 mmol/L — ABNORMAL HIGH (ref 0.0–2.0)
Bicarbonate: 32.1 mmol/L — ABNORMAL HIGH (ref 20.0–28.0)
Drawn by: 225631
FIO2: 55
LHR: 22 {breaths}/min
O2 Content: 14 L/min
O2 Saturation: 87.5 %
PCO2 ART: 41.8 mmHg (ref 32.0–48.0)
PO2 ART: 54.7 mmHg — AB (ref 83.0–108.0)
Patient temperature: 98.6
pH, Arterial: 7.497 — ABNORMAL HIGH (ref 7.350–7.450)

## 2017-03-20 LAB — BASIC METABOLIC PANEL
Anion gap: 13 (ref 5–15)
BUN: 20 mg/dL (ref 6–20)
CO2: 30 mmol/L (ref 22–32)
CREATININE: 1.21 mg/dL (ref 0.61–1.24)
Calcium: 8.7 mg/dL — ABNORMAL LOW (ref 8.9–10.3)
Chloride: 90 mmol/L — ABNORMAL LOW (ref 101–111)
GFR, EST NON AFRICAN AMERICAN: 58 mL/min — AB (ref >60)
Glucose, Bld: 139 mg/dL — ABNORMAL HIGH (ref 65–99)
POTASSIUM: 3.7 mmol/L (ref 3.5–5.1)
SODIUM: 133 mmol/L — AB (ref 135–145)

## 2017-03-20 MED ORDER — WHITE PETROLATUM GEL
Status: AC
  Administered 2017-03-20: 16:00:00
  Filled 2017-03-20: qty 1

## 2017-03-20 MED ORDER — DEXTROSE 5 % IV SOLN
1.0000 g | Freq: Three times a day (TID) | INTRAVENOUS | Status: DC
  Administered 2017-03-20 – 2017-03-22 (×7): 1 g via INTRAVENOUS
  Filled 2017-03-20 (×8): qty 1

## 2017-03-20 MED ORDER — FUROSEMIDE 10 MG/ML IJ SOLN
40.0000 mg | Freq: Two times a day (BID) | INTRAMUSCULAR | Status: DC
  Administered 2017-03-20 – 2017-03-22 (×6): 40 mg via INTRAVENOUS
  Filled 2017-03-20 (×6): qty 4

## 2017-03-20 MED ORDER — LORAZEPAM 2 MG/ML IJ SOLN
0.5000 mg | INTRAMUSCULAR | Status: AC | PRN
  Administered 2017-03-20 (×2): 0.5 mg via INTRAVENOUS
  Filled 2017-03-20 (×2): qty 1

## 2017-03-20 MED ORDER — LORAZEPAM 2 MG/ML IJ SOLN
0.5000 mg | INTRAMUSCULAR | Status: AC | PRN
Start: 2017-03-20 — End: 2017-03-21
  Administered 2017-03-20 – 2017-03-21 (×2): 0.5 mg via INTRAVENOUS
  Filled 2017-03-20 (×2): qty 1

## 2017-03-20 NOTE — Progress Notes (Signed)
Placed pt on 10L high flow O2.  sats are remaining good at 90-92%.

## 2017-03-20 NOTE — Progress Notes (Signed)
Placed pt on partial rebreather.  Sats 92%

## 2017-03-20 NOTE — Progress Notes (Signed)
PHARMACY NOTE:  ANTIMICROBIAL RENAL DOSAGE ADJUSTMENT  Current antimicrobial regimen includes a mismatch between antimicrobial dosage and estimated renal function.  As per policy approved by the Pharmacy & Therapeutics and Medical Executive Committees, the antimicrobial dosage will be adjusted accordingly.  Current antimicrobial dosage:  Cefepime 1gm IV Q12H  Indication:  HCAP  Renal Function:  Estimated Creatinine Clearance: 56.8 mL/min (by C-G formula based on SCr of 1.21 mg/dL). '[]'$      On intermittent HD, scheduled: '[]'$      On CRRT    Antimicrobial dosage has been changed to:  Cefepime 1gm IV Q8H   Nejla Reasor D. Mina Marble, PharmD, BCPS Pager:  [phone] 03/20/2017, 9:29 AM

## 2017-03-20 NOTE — Discharge Instructions (Signed)

## 2017-03-20 NOTE — Progress Notes (Signed)
SLP Cancellation Note  Patient Details Name: CADIN LUKA MRN: [id number] DOB: 06-24-1946   Cancelled treatment:       Reason Eval/Treat Not Completed: Medical issues which prohibited therapy. Pt on BiPAP; RN reported plan is to attempt weaning off later today. Will reattempt swallow eval later this afternoon as time allows.   Kern Reap, [place], CCC-SLP 03/20/2017, 11:00 AM [phone]

## 2017-03-20 NOTE — Evaluation (Signed)
Clinical/Bedside Swallow Evaluation Patient Details  Name: Paul Valdez MRN: [id number] Date of Birth: 1946/06/11  Today's Date: 03/20/2017 Time: SLP Start Time (ACUTE ONLY): 2841 SLP Stop Time (ACUTE ONLY): 1620 SLP Time Calculation (min) (ACUTE ONLY): 15 min  Past Medical History:  Past Medical History:  Diagnosis Date  . Acute respiratory failure ([place])   . Arthritis   . Asthma   . Atrial fibrillation ([place])   . Atypical chest pain    a. Normal nuc 2014.  . Carotid artery disease ([place])    a. Carotid duplex 2015: 32-44% RICA, 0-10% LICA.   Marland Kitchen Cataract   . CHF (congestive heart failure) ([place])   . Coronary artery calcification seen on CT scan   . Depression   . Detached retina   . Former consumption of alcohol   . Former tobacco use   . GERD (gastroesophageal reflux disease)    "I take heart burn medicine"  . Glaucoma   . Hemorrhoids 03/09/2017  . Hyperlipemia   . Hypertension   . Low back pain 12/29/2016  . Mass of upper lobe of left lung 12/29/2016  . PVD (peripheral vascular disease) ([place])    a. Mild plaque of iliacs in 2013 on duplex; PET 2018:  PET also corroborated coronary, aortic arch, and branch vessel atherosclerotic vascular disease as well as aortoiliac atherosclerotic vascular disease.  . Sleep apnea    has not gotten CPAP yet   Past Surgical History:  Past Surgical History:  Procedure Laterality Date  . APPENDECTOMY    . CARDIOVERSION N/A 03/16/2017   Procedure: CARDIOVERSION;  Surgeon: Sueanne Margarita, MD;  [facility] ENDOSCOPY;  Service: Cardiovascular;  Laterality: N/A;  . COLONOSCOPY    . EYE SURGERY    . FLEXIBLE BRONCHOSCOPY N/A 02/01/2017   Procedure: FLEXIBLE BRONCHOSCOPY;  Surgeon: Gaye Pollack, MD;  Location: [facility] OR;  Service: Thoracic;  Laterality: N/A;  . GANGLION CYST EXCISION     left hand  . HERNIA REPAIR     double hernia repair  . TEE WITHOUT CARDIOVERSION N/A 03/16/2017   Procedure: TRANSESOPHAGEAL ECHOCARDIOGRAM (TEE);  Surgeon: Sueanne Margarita, MD;  Location: [facility] ENDOSCOPY;  Service: Cardiovascular;  Laterality: N/A;  . THORACOTOMY/LOBECTOMY Left 02/01/2017   Procedure: THORACOTOMY/LEFT UPPER LOBECTOMY;  Surgeon: Gaye Pollack, MD;  Location: [facility] OR;  Service: Thoracic;  Laterality: Left;  . TUMOR REMOVAL     non cancer tumor from neck   HPI:  Pt is a 71 y.o. male with PMH of COPD, chronic respiratory failure on home oxygen 3-4L [place] continuous at baseline, CAD, HTN, HLD, lung cancer S/P left upper lobectomy in February 2725, diastolic CHF (LV EF 36-64%), and atrial fibrillation S/P cardioversion on 03/16/2017 (CHADS-Vasc at least 3, anticoagulated with Eliquis) who was just discharged on 3/22 after being admitted for management of HCAP, CHF exacerbation, and a fib with RVR. Since then pt has had occasional wheezing and was debilitated with bilateral leg weakness and light-headedness when standing; O2 sats wer 70s on 3-4L [place]. CXR 3/23 noted "Stable left lung opacity is noted concerning combination of postsurgical changes as well as pneumonia or atelectasis. Stable right lung interstitial densities are also noted concerning for edema or inflammation." Pt follwed by speech therapy on recent admission; had MBS on 2/10 indicating swallow function grossly [facility] and recommendations for regular/ thin liquids, meds whole in puree. Bedside swallow eval ordered.   Assessment / Plan / Recommendation Clinical Impression  Patient presents with oropharyngeal swallowing function  which appears within functional limits with adequate airway protection at bedside. No overt signs or symptoms of aspiration noted. Pt recently weaned from BiPAP now on [place], tolerating with sats in upper 80s, low 90s. Noted with drop in O2 sats while performing oral care. Tolerated thin liquids in excess of 3 oz via consecutive straw sips with no overt signs of aspiration. Oral manipulation, bolus clearance appear adequate for solids. Swallow appears timely. Pt recalled this therapist  and recommendations for slow rate of intake during previous admission. Educated pt regarding potential impacts of COPD, respiratory rate on swallow function, encouraged pt to refrain from talking while eating, minimize distractions, avoid eating or drinking when short of breath. Pt verbalizes understanding. No further skilled SLP f/u recommended at this time. Continue regular diet with thin liquids, medications whole in puree as recommended on recent MBS.  SLP will s/o. SLP Visit Diagnosis: Dysphagia, unspecified (R13.10)    Aspiration Risk  Mild aspiration risk    Diet Recommendation Regular;Thin liquid   Liquid Administration via: Cup;Straw Medication Administration: Whole meds with puree Supervision: Patient able to self feed Compensations: Slow rate;Small sips/bites Postural Changes: Seated upright at 90 degrees    Other  Recommendations Oral Care Recommendations: Oral care BID   Follow up Recommendations None      Frequency and Duration            Prognosis        Swallow Study   General Date of Onset: 03/01/2017 HPI: Pt is a 71 y.o. male with PMH of COPD, chronic respiratory failure on home oxygen 3-4L [place] continuous at baseline, CAD, HTN, HLD, lung cancer S/P left upper lobectomy in February 2233, diastolic CHF (LV EF 61-22%), and atrial fibrillation S/P cardioversion on 03/16/2017 (CHADS-Vasc at least 3, anticoagulated with Eliquis) who was just discharged on 3/22 after being admitted for management of HCAP, CHF exacerbation, and a fib with RVR. Since then pt has had occasional wheezing and was debilitated with bilateral leg weakness and light-headedness when standing; O2 sats wer 70s on 3-4L [place]. CXR 3/23 noted "Stable left lung opacity is noted concerning combination of postsurgical changes as well as pneumonia or atelectasis. Stable right lung interstitial densities are also noted concerning for edema or inflammation." Pt follwed by speech therapy on recent admission; had MBS on  2/10 indicating swallow function grossly [facility] and recommendations for regular/ thin liquids, meds whole in puree. Bedside swallow eval ordered. Type of Study: Bedside Swallow Evaluation Previous Swallow Assessment: see HPI Diet Prior to this Study: Regular;Thin liquids Temperature Spikes Noted: No Respiratory Status: Nasal cannula History of Recent Intubation: No Behavior/Cognition: Alert;Cooperative;Pleasant mood Oral Cavity Assessment: Within Functional Limits Oral Care Completed by SLP: Other (Comment) (Patient completed) Oral Cavity - Dentition: Adequate natural dentition Vision: Functional for self-feeding Self-Feeding Abilities: Able to feed self Patient Positioning: Upright in bed Baseline Vocal Quality: Normal Volitional Cough: Strong Volitional Swallow: Able to elicit    Oral/Motor/Sensory Function Overall Oral Motor/Sensory Function: Within functional limits   Ice Chips Ice chips: Within functional limits Presentation: Spoon   Thin Liquid Thin Liquid: Within functional limits Presentation: Cup;Straw    Nectar Thick Nectar Thick Liquid: Not tested   Honey Thick Honey Thick Liquid: Not tested   Puree Puree: Within functional limits Presentation: Self Fed;Spoon   Solid   GO   Solid: Within functional limits Presentation: Self Ethelle Lyon 03/20/2017,4:38 PM   Deneise Lever, MS CF-SLP Speech-Language Pathologist  [phone]  

## 2017-03-20 NOTE — Progress Notes (Addendum)
Pt seen and examined, admitted this am by Dr.Carter 71/M with COPD/chronic resp failure on 4L O2, Recent admission with HCAP/AFib RVR/Acute diastolic CHF, s/p diuresis, Abx, cardioversion re-admitted within 24 hours with dyspnea/hypoxia -remains on full dose Apixaban so PE less likely -Suspect he had fluid build up again, weight up 6-7lbs from discharge, ? Aspiration -Currently on Abx/steroids, no wheezing, CXR unchanged -wean off BIPAP -will get CT chest -add IV lasix, stop Abx if CT chest not suggestive of HCAP -check procalcitonin, SLP eval  Paul Polite MD

## 2017-03-20 NOTE — Progress Notes (Signed)
Night flow coverage progress note.  Patient was seen due to hypoxia with high oxygen requirement. He was satting 55% on 14 L Ventimask. He is currently satting in the mid 90s on nonrebreather mask. Most recent blood pressure 131/80 mmHg, pulse currently 92 BPM, respiratory rate is 18 RPM.   Lungs; Bibasilar rales and mild rhonchi bilaterally. No accessory muscle use. CV: S1S2, regular rhythm and rate, no lower extremity edema. Abdomen: Nondistended, Soft, nontender. Neuro: Moves all extremities, grossly nonfocal. Psych: Awake, alert, oriented 4.  Specimen Type: Blood   Blood gas, arterial [[id number]] (Abnormal) Collected: 03/18/2017 2345  Updated: 03/20/17 0005   Specimen Type: Blood, Arterial   Specimen Source: Artery    FIO2 55.00   O2 Content 14.0 L/min   Delivery systems VENTURI MASK   LHR 22 resp/min   pH, Arterial 7.497 (H)   pCO2 arterial 41.8 mmHg   pO2, Arterial 54.7 (L) mmHg   Bicarbonate 32.1 (H) mmol/L   Acid-Base Excess 8.4 (H) mmol/L   O2 Saturation 87.5 %   Patient temperature 98.6   Collection site LEFT BRACHIAL   Drawn by [id number]   Sample type ARTERIAL  Urinalysis, Routine w reflex microscopic [[id number]] (Abnormal) Collected: 03/15/2017 2143  Updated: 02/26/2017 2158   Specimen Type: Urine   Specimen Source: Urine, Clean Catch    Color, Urine YELLOW   APPearance HAZY (A)   Specific Gravity, Urine 1.015   pH 5.0   Glucose, UA NEGATIVE mg/dL   Hgb urine dipstick LARGE (A)   Bilirubin Urine NEGATIVE   Ketones, ur NEGATIVE mg/dL   Protein, ur NEGATIVE mg/dL   Nitrite NEGATIVE   Leukocytes, UA TRACE (A)   RBC / HPF TOO NUMEROUS TO COUNT RBC/hpf   WBC, UA 6-30 WBC/hpf   Bacteria, UA NONE SEEN   Squamous Epithelial / LPF NONE SEEN   Mucous PRESENT   Hyaline Casts, UA PRESENT   Ca Oxalate Crys, UA PRESENT  Hepatic function panel [[id number]] (Abnormal) Collected: 03/13/2017 1921  Updated: 03/10/2017 2140   Specimen Type: Blood   Specimen Source: Vein    Total  Protein 6.7 g/dL   Albumin 2.5 (L) g/dL   AST 35 U/L   ALT 26 U/L   Alkaline Phosphatase 93 U/L   Total Bilirubin 0.6 mg/dL   Bilirubin, Direct 0.1 mg/dL   Indirect Bilirubin 0.5 mg/dL  Brain natriuretic peptide [[id number]] (Abnormal) Collected: 03/02/2017 1914  Updated: 03/18/2017 2020   Specimen Type: Blood   Specimen Source: Vein    B Natriuretic Peptide 220.5 (H) pg/mL  Troponin I [[id number]] (Abnormal) Collected: 02/25/2017 1921  Updated: 03/17/2017 2019   Specimen Type: Blood   Specimen Source: Vein    Troponin I 0.33 (HH) ng/mL  Basic metabolic panel [[id number]] (Abnormal) Collected: 02/27/2017 1914  Updated: 03/13/2017 1958   Specimen Type: Blood   Specimen Source: Vein    Sodium 130 (L) mmol/L   Potassium 4.1 mmol/L   Chloride 88 (L) mmol/L   CO2 28 mmol/L   Glucose, Bld 131 (H) mg/dL   BUN 23 (H) mg/dL   Creatinine, Ser 1.42 (H) mg/dL   Calcium 9.0 mg/dL   GFR calc non Af Amer 48 (L) mL/min   GFR calc Af Amer 56 (L) mL/min   Anion gap 14  CBC [[id number]] (Abnormal) Collected: 03/22/2017 1914  Updated: 03/26/2017 1947   Specimen Type: Blood   Specimen Source: Vein    WBC 16.2 (H) K/uL   RBC 3.81 (L) MIL/uL   Hemoglobin 10.6 (L)  g/dL   HCT 33.0 (L) %   MCV 86.6 fL   MCH 27.8 pg   MCHC 32.1 g/dL   RDW 15.3 %   Platelets 553 (H) K/uL   Vent. rate 91 BPM PR interval 200 ms QRS duration 96 ms QT/QTc 438/538 ms P-R-T axes 5 83 40 Normal sinus rhythm Prolonged QT Abnormal ECG  Acute on chronic respiratory failure Secondary to HCAP, left upper lobe lobectomy, COPD and diastolic CHF exacerbation. Initially declined BiPAP ventilation, but agreed to have it during the night with low dose lorazepam sedation. Will proceed with BiPAP ventilation.   Tennis Must, MD [phone].

## 2017-03-21 ENCOUNTER — Inpatient Hospital Stay (HOSPITAL_COMMUNITY): Payer: Medicare Other

## 2017-03-21 DIAGNOSIS — R0602 Shortness of breath: Secondary | ICD-10-CM

## 2017-03-21 DIAGNOSIS — C3412 Malignant neoplasm of upper lobe, left bronchus or lung: Secondary | ICD-10-CM

## 2017-03-21 DIAGNOSIS — Z515 Encounter for palliative care: Secondary | ICD-10-CM

## 2017-03-21 DIAGNOSIS — J189 Pneumonia, unspecified organism: Secondary | ICD-10-CM

## 2017-03-21 DIAGNOSIS — J9621 Acute and chronic respiratory failure with hypoxia: Secondary | ICD-10-CM

## 2017-03-21 LAB — CBC
HCT: 30.6 % — ABNORMAL LOW (ref 39.0–52.0)
HEMOGLOBIN: 9.9 g/dL — AB (ref 13.0–17.0)
MCH: 28 pg (ref 26.0–34.0)
MCHC: 32.4 g/dL (ref 30.0–36.0)
MCV: 86.4 fL (ref 78.0–100.0)
Platelets: 499 10*3/uL — ABNORMAL HIGH (ref 150–400)
RBC: 3.54 MIL/uL — AB (ref 4.22–5.81)
RDW: 15.2 % (ref 11.5–15.5)
WBC: 16 10*3/uL — AB (ref 4.0–10.5)

## 2017-03-21 LAB — BASIC METABOLIC PANEL
ANION GAP: 13 (ref 5–15)
BUN: 28 mg/dL — ABNORMAL HIGH (ref 6–20)
CHLORIDE: 89 mmol/L — AB (ref 101–111)
CO2: 30 mmol/L (ref 22–32)
Calcium: 8.9 mg/dL (ref 8.9–10.3)
Creatinine, Ser: 1.06 mg/dL (ref 0.61–1.24)
GFR calc Af Amer: >60 mL/min (ref >60)
GFR calc non Af Amer: >60 mL/min (ref >60)
Glucose, Bld: 132 mg/dL — ABNORMAL HIGH (ref 65–99)
Potassium: 3.6 mmol/L (ref 3.5–5.1)
SODIUM: 132 mmol/L — AB (ref 135–145)

## 2017-03-21 LAB — PROCALCITONIN: PROCALCITONIN: 0.31 ng/mL

## 2017-03-21 MED ORDER — IOPAMIDOL (ISOVUE-370) INJECTION 76%
INTRAVENOUS | Status: AC
  Administered 2017-03-21: 100 mL
  Filled 2017-03-21: qty 100

## 2017-03-21 MED ORDER — LORAZEPAM 2 MG/ML IJ SOLN
0.5000 mg | INTRAMUSCULAR | Status: AC | PRN
Start: 2017-03-21 — End: 2017-03-22
  Administered 2017-03-21 – 2017-03-22 (×3): 0.5 mg via INTRAVENOUS
  Filled 2017-03-21 (×4): qty 1

## 2017-03-21 MED ORDER — ENOXAPARIN SODIUM 80 MG/0.8ML ~~LOC~~ SOLN
1.0000 mg/kg | Freq: Two times a day (BID) | SUBCUTANEOUS | Status: DC
  Administered 2017-03-21 – 2017-03-22 (×3): 80 mg via SUBCUTANEOUS
  Filled 2017-03-21 (×3): qty 0.8

## 2017-03-21 NOTE — Progress Notes (Addendum)
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 Non contrast CT chest again noted diffuse patchy airspace disease bilaterally highly suspicious for superimposed pneumonia. There is improvement from prior exam especially bilateral lower lobe superior segments, residual infiltrate in left lower lobe posterior medially, nodular pleural based consolidation in left lower lobe anteriorly axial image 40 measures 2.8 cm. This is highly suspicious for recurrent tumor.  Assessment & Plan:   Principal Problem:   Acute on chronic respiratory failure with hypoxia (HCC) -Off BiPAP, on nonrebreather since overnight -multifactorial, mild component of CHF, ? HCAP vs Interstitial lung dz and underlying COPD - weight 7lbs above discharge wt on re-admission and diffuse air space dz, ?Pneumonia findings on CT not different from last week, continue IV Lasix, back on Vanc and Cefepime -will stop Vanc tomorrow if Cx negative, Follow procalcitonin and keep on Cefepime for now -SLP eval to r/o aspiration -given high degree of Hypoxia and pleuritic chest pain this am, if am concerned whether there could be a VTE event despite being on Eliquis, less likely but possible, check Dopplers, unfortunately just got CT chest yesterday but without contrast, will get CTA chest today r/o PE, d/w Paul Valdez this am for PCCM consult who agreed above plan was reasonable, will also change  Apixaban to Full dose Lovenox for now pending CTA -I discussed code status with Pt and POA yesterday and pt today : he insists on Full COde -d/w PoA and other close friends and patient yesterday, they are agreeable for a Palliative consult due to multiple cardiopulm issues, recurrent admissions, failure to thive -I stopped Iv steroids, no wheezing noted, I do wonder if he has interstitial lung disease based on his CT findings, await Pulm input  H/o Stage 1B NSC Lung CA -s/p lobectomy of lung in February -? Possibility of recurrence based on CT-will need PET down the road  Acute on Chronic diastolci CHF -s/p aggressive diureses last admit, 9 L negative at the time of discharge -Appears mostly euvolemic at this time, however will keep him on IV Lasix to keep him net even to dry  COPD/chronic respiratory failure -No wheezing stopped IV steroids -Nebs when necessary  Paroxysmal atrial fibrillation -Status post DC cardioversion last week and now on Flecainide -Remains in sinus rhythm, changed Apixaban to subcutaneous Lovenox full dose for possible PE  pending CT angiogram   Adult failure to thrive -Multiple cardiopulmonary issues, frequent readmission, chronic respiratory failure, discussed in detail with POA yesterday along with patient and requested palliative consult for goals of care  DVT prophylaxis: Full dose Lovenox  Code Status: Full code  Family Communication:No family at bedside discussed with Paul Valdez yesterday  Disposition Plan: Keep in stepdown for now may need ICU transfer if declines further  Consultants:   PCCM   Procedures:   Antimicrobials:  Vanc Clois Dupes 3/23-now  Subjective: Still feels short of breath, remains on NRM, off BIPAP yesterday  Objective: Vitals:   03/20/17 2343 03/21/17 0300  03/21/17 0500 03/21/17 0720  BP: (!) 170/95 (!) 149/74  (!) 141/75  Pulse: 88 70  (!) 59  Resp: '18 14  12  '$ Temp: 98.1 F (36.7 C) 98.2 F (36.8 C)    TempSrc:  Axillary Axillary    SpO2: 92% 90%  (!) 89%  Weight:   80.4 kg (177 lb 4 oz)   Height:        Intake/Output Summary (Last 24 hours) at 03/21/17 1009 Last data filed at 03/20/17 2345  Gross per 24 hour  Intake              290 ml  Output             2200 ml  Net            -1910 ml   Filed Weights   02/27/2017 2255 03/20/17 0441 03/21/17 0500  Weight: 84.9 kg (187 lb 2.7 oz) 80.1 kg (176 lb 9.4 oz) 80.4 kg (177 lb 4 oz)    Examination:  General exam: Appears calm, but uncomfortable, out of breath after few words, more lucid this am Respiratory system: decreased BS at bases, no wheezes, faint basilar crackles Cardiovascular system: S1 & S2 heard, RRR. No JVD,  Gastrointestinal system: Abdomen is nondistended, soft and nontender. Normal bowel sounds heard. Central nervous system: Alert and oriented. No focal neurological deficits. Extremities: Symmetric 5 x 5 power, no edema Skin: No rashes, lesions or ulcers Psychiatry: Judgement and insight appear normal. Mood & affect appropriate.     Data Reviewed:   CBC:  Recent Labs Lab 03/17/17 0604 03/18/17 0035 03/11/2017 1914 03/20/17 0408 03/21/17 0237  WBC 9.4 9.1 16.2* 14.3* 16.0*  NEUTROABS  --   --   --  12.6*  --   HGB 11.3* 10.4* 10.6* 9.9* 9.9*  HCT 36.1* 32.3* 33.0* 30.9* 30.6*  MCV 89.4 88.5 86.6 86.8 86.4  PLT 498* 463* 553* 478* 008*   Basic Metabolic Panel:  Recent Labs Lab 03/17/17 0604 03/18/17 0035 03/07/2017 1914 03/20/17 0408 03/21/17 0237  NA 134* 134* 130* 133* 132*  K 4.0 4.1 4.1 3.7 3.6  CL 87* 91* 88* 90* 89*  CO2 37* 33* '28 30 30  '$ GLUCOSE 112* 113* 131* 139* 132*  BUN 15 18 23* 20 28*  CREATININE 1.05 1.07 1.42* 1.21 1.06  CALCIUM 9.1 9.0 9.0 8.7* 8.9   GFR: Estimated Creatinine Clearance: 64.9 mL/min (by C-G formula based on SCr of 1.06 mg/dL). Liver Function Tests:  Recent Labs Lab 03/12/2017 1921  AST 35  ALT 26  ALKPHOS 93  BILITOT 0.6  PROT 6.7  ALBUMIN 2.5*   No results for  input(s): LIPASE, AMYLASE in the last 168 hours. No results for input(s): AMMONIA in the last 168 hours. Coagulation Profile: No results for input(s): INR, PROTIME in the last 168 hours. Cardiac Enzymes:  Recent Labs Lab 03/21/2017 1921 03/09/2017 2344 03/20/17 0230 03/20/17 0408  TROPONINI 0.33* 0.03* <0.03 <0.03   BNP (last 3 results) No results for input(s): PROBNP in the last 8760 hours. HbA1C: No results for input(s): HGBA1C in the last 72 hours. CBG: No results for input(s): GLUCAP in the last 168 hours. Lipid Profile: No results for input(s): CHOL, HDL, LDLCALC, TRIG, CHOLHDL, LDLDIRECT in the last 72 hours. Thyroid Function Tests: No results for input(s): TSH, T4TOTAL, FREET4, T3FREE, THYROIDAB in the last 72 hours. Anemia Panel: No results for input(s): VITAMINB12, FOLATE, FERRITIN, TIBC, IRON, RETICCTPCT in the last 72 hours. Urine analysis:  Component Value Date/Time   COLORURINE YELLOW 03/05/2017 2143   APPEARANCEUR HAZY (A) 03/13/2017 2143   LABSPEC 1.015 03/02/2017 2143   PHURINE 5.0 03/05/2017 2143   GLUCOSEU NEGATIVE 03/18/2017 2143   HGBUR LARGE (A) 03/11/2017 2143   BILIRUBINUR NEGATIVE 02/27/2017 2143   KETONESUR NEGATIVE 03/09/2017 2143   PROTEINUR NEGATIVE 03/21/2017 2143   NITRITE NEGATIVE 03/08/2017 2143   LEUKOCYTESUR TRACE (A) 03/10/2017 2143   Sepsis Labs: '@LABRCNTIP'$ (procalcitonin:4,lacticidven:4)  ) Recent Results (from the past 240 hour(s))  MRSA PCR Screening     Status: None   Collection Time: 03/14/17 10:37 AM  Result Value Ref Range Status   MRSA by PCR NEGATIVE NEGATIVE Final    Comment:        The GeneXpert MRSA Assay (FDA approved for NASAL specimens only), is one component of a comprehensive MRSA colonization surveillance program. It is not intended to diagnose MRSA infection nor to guide or monitor treatment for MRSA infections.          Radiology Studies: Dg Chest 2 View  Result Date: 03/02/2017 CLINICAL DATA:   Shortness of breath. Status post left upper lobectomy. EXAM: CHEST  2 VIEW COMPARISON:  Radiograph of March 14, 2017. FINDINGS: Stable cardiomediastinal silhouette. Postsurgical changes are again noted in the left hemothorax with some degree of volume loss present. Stable left upper lobe and lower lobe airspace opacities are noted concerning for pneumonia or atelectasis. No pneumothorax is noted. Stable diffuse interstitial densities noted throughout the right lung concerning for edema or inflammation. Bony thorax is unremarkable. IMPRESSION: Stable left lung opacity is noted concerning combination of postsurgical changes as well as pneumonia or atelectasis. Stable right lung interstitial densities are also noted concerning for edema or inflammation. Electronically Signed   By: Marijo Conception, M.D.   On: 03/18/2017 20:21   Ct Chest Wo Contrast  Result Date: 03/20/2017 CLINICAL DATA:  Hypoxia, increased shortness of breath today, lung cancer EXAM: CT CHEST WITHOUT CONTRAST TECHNIQUE: Multidetector CT imaging of the chest was performed following the standard protocol without IV contrast. COMPARISON:  02/15/2017 and 03/15/2017 FINDINGS: Cardiovascular: Atherosclerotic calcifications of thoracic aorta again noted. Cardiomegaly. No pericardial effusion. 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 Mediastinum/Nodes: No mediastinal adenopathy. Large hiatal hernia again noted. Lungs/Pleura: Again noted postsurgical changes post left upper lobectomy. Stable peripheral thickening of interlobular septa and subpleural blebs bilaterally. Again noted diffuse patchy airspace disease bilaterally highly suspicious for superimpose pneumonia. There is improvement from prior exam especially bilateral lower lobe superior segments. No convincing pulmonary edema. There is no pneumothorax. Axial image 40 there is nodular pleural based consolidation in right lower lobe anteriorly. Recurrent tumor  is highly suspicious. Further correlation with PET scan is recommended. Again noted some residual infiltrate/pneumonia in left lower lobe posterior medially. Mild bronchiectasis bilaterally stable. Upper Abdomen: Visualized upper abdomen shows no adrenal gland mass. Musculoskeletal: Sagittal images of the spine shows mild degenerative changes thoracic spine. No destructive bony lesions are noted. Axial image 70 stable healing fracture deformity of the left fifth rib. IMPRESSION: 1. Again noted postsurgical changes post left upper lobectomy. Stable peripheral thickening of interlobular septa and subpleural blebs bilaterally. Again noted diffuse patchy airspace disease bilaterally highly suspicious for superimposed pneumonia. There is improvement from prior exam especially bilateral lower lobe superior segments. There is residual infiltrate in left lower lobe posterior medially please see axial image 105 lung windows. 2. There is nodular pleural based consolidation in left lower lobe anteriorly axial  image 40 measures 2.8 cm. This is highly suspicious for recurrent tumor. Further correlation with PET scan is recommended. 3. Stable healing fracture deformity of the left fifth rib. Please see axial image 70. 4. Large hiatal hernia again noted. Electronically Signed   By: Lahoma Crocker M.D.   On: 03/20/2017 13:51   US Renal  Result Date: 03/20/2017 CLINICAL DATA:  Acute onset of renal insufficiency. Microhematuria. Initial encounter. EXAM: RENAL / URINARY TRACT ULTRASOUND COMPLETE COMPARISON:  PET/CT performed 01/04/2017 FINDINGS: Right Kidney: Length: 10.2 cm. Echogenicity within normal limits. No mass or hydronephrosis visualized. Left Kidney: Length: 1.9 cm. Echogenicity within normal limits. No mass or hydronephrosis visualized. Bladder: Appears normal for degree of bladder distention. IMPRESSION: Unremarkable renal ultrasound. Electronically Signed   By: Garald Balding M.D.   On: 03/20/2017 03:25         Scheduled Meds: . atorvastatin  40 mg Oral QPM  . ceFEPime (MAXIPIME) IV  1 g Intravenous Q8H  . diltiazem  180 mg Oral Daily  . enoxaparin (LOVENOX) injection  1 mg/kg Subcutaneous Q12H  . flecainide  100 mg Oral Q12H  . furosemide  40 mg Intravenous Q12H  . gabapentin  600 mg Oral TID  . iopamidol      . pantoprazole  40 mg Oral Daily  . potassium chloride SA  40 mEq Oral Daily  . sertraline  100 mg Oral Daily  . tamsulosin  0.4 mg Oral QPC supper  . vancomycin  750 mg Intravenous Q12H   Continuous Infusions:   LOS: 2 days    Time spent: 38mn    PDomenic Polite MD [facility] Pager [phone] If 7PM-7AM, please contact night-coverage [email or web address] Password [email or web address] 03/21/2017, 10:09 AM

## 2017-03-21 NOTE — Progress Notes (Signed)
Placed pt on High Flow but sats a little low so placed on Partial rebreather Sats up to 91%

## 2017-03-21 NOTE — Progress Notes (Signed)
VASCULAR LAB PRELIMINARY  PRELIMINARY  PRELIMINARY  PRELIMINARY  Bilateral lower extremity venous duplex completed.    Preliminary report:  There is no DVT or SVT noted in the bilateral lower extremities.   Deshay Kirstein, RVT 03/21/2017, 5:39 PM

## 2017-03-21 NOTE — Progress Notes (Signed)
Patient complaining of new onset of chest pain.  Pt. States "it feels dull"  Patient complains "its worse when I breathe in."  Pt. VSS with no outward s/s of distress noted.  Dr. Broadus John updated on patients change in condition.  MD to place orders.   12 LED EKG in chart.

## 2017-03-21 NOTE — Consult Note (Signed)
Consultation Note Date: 03/21/2017   Patient Name: Paul Valdez  DOB: 1946/07/22  MRN: [id number]  Age / Sex: 71 y.o., male  PCP: Antony Contras, MD Referring Physician: Domenic Polite, MD  Reason for Consultation: Establishing goals of care and Psychosocial/spiritual support  HPI/Patient Profile: 71 y.o. male  with past medical history of Arthritis, asthma, COPD, atrial fibrillation with cardioversion on 03/16/2017 carotid artery disease, congestive heart failure, GERD, hyperlipidemia, hypertension, low back pain, sleep apnea, lung cancer to left upper lobe with lobectomy 02/01/2017 admitted on 03/18/2017 with shortness of breath, reported 5 pound weight gain in 24 hours. Paul Valdez has had several admissions in 2018 beginning 02/01/2017 when he was diagnosed with lung cancer. Since that time he states "I've been told I have COPD, atrial fib, congestive heart failure". He is stating he feels debilitated, has bilateral leg weakness, "my legs just give out when I try to walk"..   Clinical Assessment and Goals of Care: Patient shares that he doesn't completely understand why he is this sick. He states since being diagnosed with lung cancer and being told this year that he has multiple comorbidities of congestive heart failure, atrial fibrillation, COPD, this is been hard for him to absorb how his health can change so suddenly. He reports prior to this year he did not know that he had congestive heart failure atrial fib or COPD. In December, January, he was living independently he reports without disability  His goals are to live "a reasonable life". By this he means he would like to return to independent living in his home if he can rent disease but he would consider residing in an assisted living facility), he like to be able to take care of his dog whom he has been with 12 years, being able to cook a meal and take care  of his personal needs.  . Patient at this point can speak for himself. He does have a healthcare power of attorney, Paul Valdez. Paul Valdez is a friend of 20+ years and can be reached at [phone]    SUMMARY [place] to hold a meeting with patient, patient's healthcare power of attorney as well as another close friend on 03/22/2017 at 10 AM. No changes to care plan or goals of care at this point Code Status/Advance Care Planning:  Full code    Symptom Management:   Dyspnea: Continue with targeted pulmonary treatments such as oxygen, BiPAP.  Palliative Prophylaxis:   Aspiration, Bowel Regimen, Delirium Protocol, Eye Care, Frequent Pain Assessment, Oral Care and Turn Reposition  Additional Recommendations (Limitations, Scope, Preferences):  Full Scope Treatment  Psycho-social/Spiritual:   Desire for further Chaplaincy support:no  Prognosis:   Unable to determine  Discharge Planning: To Be Determined      Primary Diagnoses: Present on Admission: . Respiratory failure ([place]) . Lung cancer ([place]) . Squamous cell carcinoma lung, left ([place]) . Acute on chronic diastolic CHF (congestive heart failure) ([place]) . HCAP (healthcare-associated pneumonia) . Dyspnea   I have reviewed  the medical record, interviewed the patient and family, and examined the patient. The following aspects are pertinent.  Past Medical History:  Diagnosis Date  . Acute respiratory failure ([place])   . Arthritis   . Asthma   . Atrial fibrillation ([place])   . Atypical chest pain    a. Normal nuc 2014.  . Carotid artery disease ([place])    a. Carotid duplex 2015: 10-27% RICA, 2-53% LICA.   Marland Kitchen Cataract   . CHF (congestive heart failure) ([place])   . Coronary artery calcification seen on CT scan   . Depression   . Detached retina   . Former consumption of alcohol   . Former tobacco use   . GERD (gastroesophageal reflux disease)    "I take heart burn medicine"  .  Glaucoma   . Hemorrhoids 03/09/2017  . Hyperlipemia   . Hypertension   . Low back pain 12/29/2016  . Mass of upper lobe of left lung 12/29/2016  . PVD (peripheral vascular disease) ([place])    a. Mild plaque of iliacs in 2013 on duplex; PET 2018:  PET also corroborated coronary, aortic arch, and branch vessel atherosclerotic vascular disease as well as aortoiliac atherosclerotic vascular disease.  . Sleep apnea    has not gotten CPAP yet   Social History   Social History  . Marital status: Single    Spouse name: N/A  . Number of children: N/A  . Years of education: N/A   Social History Main Topics  . Smoking status: Former Research scientist (life sciences)  . Smokeless tobacco: Never Used     Comment: QUIT SMOKING IN THE LATE 90'S  . Alcohol use No  . Drug use: No  . Sexual activity: Not Asked   Other Topics Concern  . None   Social History Narrative  . None   Family History  Problem Relation Age of Onset  . Colon cancer Father   . Hypertension Father   . Hypertension Mother   . Colon cancer Sister   . Colon cancer Brother    Scheduled Meds: . atorvastatin  40 mg Oral QPM  . ceFEPime (MAXIPIME) IV  1 g Intravenous Q8H  . diltiazem  180 mg Oral Daily  . enoxaparin (LOVENOX) injection  1 mg/kg Subcutaneous Q12H  . flecainide  100 mg Oral Q12H  . furosemide  40 mg Intravenous Q12H  . gabapentin  600 mg Oral TID  . pantoprazole  40 mg Oral Daily  . potassium chloride SA  40 mEq Oral Daily  . sertraline  100 mg Oral Daily  . tamsulosin  0.4 mg Oral QPC supper  . vancomycin  750 mg Intravenous Q12H   Continuous Infusions: PRN Meds:.acetaminophen, ipratropium-albuterol Medications Prior to Admission:  Prior to Admission medications   Medication Sig Start Date End Date Taking? Authorizing Provider  apixaban (ELIQUIS) 5 MG TABS tablet Take 1 tablet (5 mg total) by mouth 2 (two) times daily. 03/01/17  Yes Imogene Burn, PA-C  atorvastatin (LIPITOR) 40 MG tablet Take 1 tablet (40 mg total) by mouth  every evening. 03/01/17  Yes Imogene Burn, PA-C  cyanocobalamin (,VITAMIN B-12,) 1000 MCG/ML injection Inject 1 mL (1,000 mcg total) into the skin every 30 (thirty) days. Please go to your [facility] MD's office for this injection 02/17/17  Yes Shanker Kristeen Mans, MD  diltiazem (CARDIZEM CD) 180 MG 24 hr capsule Take 1 capsule (180 mg total) by mouth daily. 03/01/17  Yes Imogene Burn, PA-C  flecainide (TAMBOCOR) 100 MG tablet  Take 1 tablet (100 mg total) by mouth every 12 (twelve) hours. 03/18/17  Yes Domenic Polite, MD  furosemide (LASIX) 40 MG tablet Take 1 tablet (40 mg total) by mouth 2 (two) times daily. 03/18/17  Yes Domenic Polite, MD  gabapentin (NEURONTIN) 300 MG capsule Take 2 capsules (600 mg total) by mouth 3 (three) times daily. 02/17/17  Yes Shanker Kristeen Mans, MD  omeprazole (PRILOSEC) 20 MG capsule Take 1 capsule (20 mg total) by mouth every evening. 02/17/17  Yes Shanker Kristeen Mans, MD  Potassium Chloride ER 20 MEQ TBCR Take 40 mEq by mouth 2 (two) times daily. 03/18/17  Yes Domenic Polite, MD  sertraline (ZOLOFT) 100 MG tablet Take 1 tablet (100 mg total) by mouth daily. 02/17/17  Yes Shanker Kristeen Mans, MD  tamsulosin (FLOMAX) 0.4 MG CAPS capsule Take 1 capsule (0.4 mg total) by mouth daily after supper. 02/17/17  Yes Shanker Kristeen Mans, MD  Vitamin D, Ergocalciferol, (DRISDOL) 50000 units CAPS capsule Take 1 capsule (50,000 Units total) by mouth every 7 (seven) days. Every Friday 02/17/17  Yes Shanker Kristeen Mans, MD   Allergies  Allergen Reactions  . Colchicine Diarrhea   Review of Systems  Constitutional: Positive for activity change, fatigue and unexpected weight change.  HENT: Negative.   Eyes: Negative.   Respiratory: Positive for cough and shortness of breath.   Cardiovascular: Positive for chest pain.  Endocrine: Negative.   Genitourinary: Negative.   Musculoskeletal: Positive for back pain.  Allergic/Immunologic: Negative.   Neurological: Positive for weakness.  Hematological:  Negative.   Psychiatric/Behavioral: The patient is nervous/anxious.     Physical Exam  Constitutional: He is oriented to person, place, and time. He appears well-developed and well-nourished.  HENT:  Head: Normocephalic and atraumatic.  Neck: Normal range of motion.  Cardiovascular:  PVC's  Pulmonary/Chest:  Desats with any movement On high flow 02 alternating with NRB  Musculoskeletal: Normal range of motion.  Neurological: He is alert and oriented to person, place, and time.  Skin: Skin is warm and dry.  Psychiatric: He has a normal mood and affect.  Nursing note and vitals reviewed.   Vital Signs: BP (!) 141/75   Pulse (!) 59   Temp 98.2 F (36.8 C) (Axillary)   Resp 12   Ht '5\' 7"'$  (1.702 m)   Wt 80.4 kg (177 lb 4 oz)   SpO2 (!) 89%   BMI 27.76 kg/m  Pain Assessment: No/denies pain   Pain Score: 0-No pain   SpO2: SpO2: (!) 89 % O2 Device:SpO2: (!) 89 % O2 Flow Rate: .O2 Flow Rate (L/min): 15 L/min  IO: Intake/output summary:  Intake/Output Summary (Last 24 hours) at 03/21/17 1309 Last data filed at 03/20/17 2345  Gross per 24 hour  Intake              290 ml  Output              750 ml  Net             -460 ml    LBM: Last BM Date: 03/20/17 Baseline Weight: Weight: 85.4 kg (188 lb 4 oz) Most recent weight: Weight: 80.4 kg (177 lb 4 oz)     Palliative Assessment/Data:   Flowsheet Rows     Most Recent Value  Intake Tab  Referral Department  Hospitalist  Unit at Time of Referral  Cardiac/Telemetry Unit  Palliative Care Primary Diagnosis  Cardiac  Date Notified  03/20/17  Palliative Care Type  New  Palliative care  Reason for referral  Clarify Goals of Care, Advance Care Planning  Date of Admission  03/26/2017  Date first seen by Palliative Care  03/21/17  # of days Palliative referral response time  1 Day(s)  # of days IP prior to Palliative referral  1  Clinical Assessment  Palliative Performance Scale Score  40%  Pain Max last 24 hours  6  Pain Min  Last 24 hours  2  Dyspnea Max Last 24 Hours  8  Dyspnea Min Last 24 hours  4  Nausea Min Last 24 Hours  0  Anxiety Max Last 24 Hours  3  Anxiety Min Last 24 Hours  0  Psychosocial & Spiritual Assessment  Palliative Care Outcomes  Patient/Family meeting held?  Yes  Who was at the meeting?  pt,  [facility] mtg scheduled for 3/26 @ 10am  Palliative Care follow-up planned  Yes, Facility      Time In: 0945 Time Out: 1055 Time Total: 85 min Greater than 50%  of this time was spent counseling and coordinating care related to the above assessment and plan.  Signed by: Dory Horn, NP   Please contact Palliative Medicine Team phone at [phone] for questions and concerns.  For individual provider: See Shea Evans

## 2017-03-21 NOTE — Consult Note (Signed)
Name: Paul Valdez MRN: [id number] DOB: Jan 20, 1946    ADMISSION DATE:  02/27/2017 CONSULTATION DATE:  03/21/2017  REFERRING MD :  Domenic Polite, MD  CHIEF COMPLAINT: Dyspnea  BRIEF PATIENT DESCRIPTION:  Deconditioned chronically ill appearing elderly obese  male , supine in bed wearing [place] at 4 L, saturations 91%  SIGNIFICANT EVENTS  3/23: Readmission after less than 24 hours after discharge   Antibiotics: Vanc 3/23>> Cefepime 3/23>>    STUDIES: CTA 03/21/2017 IMPRESSION: Negative for pulmonary embolism. Diffuse parenchymal disease throughout both lungs suggestive for pneumonia. Increased densities in both lungs probably related to expiratory phase of imaging.  Pleural-based nodular density in the left hemithorax. This has been present since the left upper lobectomy and could represent focal loculated pleural fluid but nonspecific. Recommend close follow-up of this area.  CT Chest W/O Contrast Post surgical changes post left upper lobectomy. Stable peripheral thickening of interlobular septa and subpleural blebs bilaterally. Diffuse patchy airspace disease bilaterally highly suspicious for superimposed pneumonia.  Improvement from prior exam especially bilateral lower lobe superior segments. There is residual infiltrate in left lower lobe posterior medially .There is nodular pleural based consolidation in left lower lobe anteriorly axial image 40 measures 2.8 cm. This is highly suspicious for recurrent tumor. Further correlation with PET scan is recommended. Stable healing fracture deformity of the left fifth rib. . Large hiatal hernia again noted.   CXR 03/14/2017  IMPRESSION: Stable left lung opacity is noted concerning combination of postsurgical changes as well as pneumonia or atelectasis. Stable right lung interstitial densities are also noted concerning for edema or inflammation.   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 States he had a 5 pound weight gain in the 1 day he was home.Of note, patient has been admitted 4 times since February 2018. Last discharge home lasting less than 24 hours.Non contrast CT chest again noted diffuse patchy airspace disease bilaterally highly suspicious for superimposed pneumonia. There is improvement from prior exam especially bilateral lower lobe superior segments, residual infiltrate in left lower lobe posterior medially, nodular pleural based consolidation in left lower lobe anteriorly axial image 40 measures 2.8 cm. This is highly suspicious for recurrent tumor.   PAST MEDICAL HISTORY :   has a past medical history of Acute respiratory failure ([place]); Arthritis; Asthma; Atrial fibrillation ([place]); Atypical chest pain; Carotid artery disease ([place]); Cataract; CHF (congestive heart failure) ([place]); Coronary artery calcification seen on CT scan; Depression; Detached retina; Former consumption of alcohol; Former tobacco use; GERD (gastroesophageal reflux disease); Glaucoma; Hemorrhoids (03/09/2017); Hyperlipemia; Hypertension; Low back pain (12/29/2016); Mass of upper lobe of left lung (12/29/2016); PVD (peripheral vascular disease) ([place]); and Sleep apnea.  has a past surgical history that includes Appendectomy; Eye surgery; Hernia repair; Ganglion cyst excision; Tumor removal; Colonoscopy; Flexible bronchoscopy (N/A, 02/01/2017); Thoracotomy/lobectomy (Left, 02/01/2017); Cardioversion (N/A, 03/16/2017); and TEE without cardioversion (N/A, 03/16/2017). Prior to  Admission medications   Medication Sig Start Date End Date Taking? Authorizing Provider  apixaban (ELIQUIS) 5 MG TABS tablet Take 1 tablet (5 mg  total) by mouth 2 (two) times daily. 03/01/17  Yes Imogene Burn, PA-C  atorvastatin (LIPITOR) 40 MG tablet Take 1 tablet (40 mg total) by mouth every evening. 03/01/17  Yes Imogene Burn, PA-C  cyanocobalamin (,VITAMIN B-12,) 1000 MCG/ML injection Inject 1 mL (1,000 mcg total) into the skin every 30 (thirty) days. Please go to your [facility] MD's office for this injection 02/17/17  Yes Shanker Kristeen Mans, MD  diltiazem (CARDIZEM CD) 180 MG 24 hr capsule Take 1 capsule (180 mg total) by mouth daily. 03/01/17  Yes Imogene Burn, PA-C  flecainide (TAMBOCOR) 100 MG tablet Take 1 tablet (100 mg total) by mouth every 12 (twelve) hours. 03/18/17  Yes Domenic Polite, MD  furosemide (LASIX) 40 MG tablet Take 1 tablet (40 mg total) by mouth 2 (two) times daily. 03/18/17  Yes Domenic Polite, MD  gabapentin (NEURONTIN) 300 MG capsule Take 2 capsules (600 mg total) by mouth 3 (three) times daily. 02/17/17  Yes Shanker Kristeen Mans, MD  omeprazole (PRILOSEC) 20 MG capsule Take 1 capsule (20 mg total) by mouth every evening. 02/17/17  Yes Shanker Kristeen Mans, MD  Potassium Chloride ER 20 MEQ TBCR Take 40 mEq by mouth 2 (two) times daily. 03/18/17  Yes Domenic Polite, MD  sertraline (ZOLOFT) 100 MG tablet Take 1 tablet (100 mg total) by mouth daily. 02/17/17  Yes Shanker Kristeen Mans, MD  tamsulosin (FLOMAX) 0.4 MG CAPS capsule Take 1 capsule (0.4 mg total) by mouth daily after supper. 02/17/17  Yes Shanker Kristeen Mans, MD  Vitamin D, Ergocalciferol, (DRISDOL) 50000 units CAPS capsule Take 1 capsule (50,000 Units total) by mouth every 7 (seven) days. Every Friday 02/17/17  Yes Shanker Kristeen Mans, MD   Allergies  Allergen Reactions  . Colchicine Diarrhea    FAMILY HISTORY:  family history includes Colon cancer in his brother, father, and sister; Hypertension in his father and mother. SOCIAL  HISTORY:  reports that he has quit smoking. He has never used smokeless tobacco. He reports that he does not drink alcohol or use drugs.  REVIEW OF SYSTEMS:   Constitutional: Negative for fever, chills, weight loss, ++malaise/fatigue and no diaphoresis.  HENT: Negative for hearing loss, ear pain, nosebleeds, congestion, sore throat, neck pain, tinnitus and ear discharge.   Eyes: Negative for blurred vision, double vision, photophobia, pain, discharge and redness.  Respiratory: Rare  for cough, no hemoptysis, + for scant sputum production ( [place] with traces of blood streaks), ++shortness of breath, no wheezing and stridor.   Cardiovascular: Negative for chest pain, palpitations, orthopnea, claudication, + leg swelling and PND.  Gastrointestinal: Negative for heartburn, nausea, vomiting, abdominal pain, diarrhea, constipation, blood in stool and melena.  Genitourinary: Negative for dysuria, urgency, frequency, hematuria and flank pain.  Musculoskeletal: Negative for myalgias, back pain, joint pain and falls.  Skin: Negative for itching and rash.  Neurological: Negative for dizziness, tingling, tremors, sensory change, speech change, focal weakness, seizures, loss of consciousness, weakness and headaches. States he feels forgetful Endo/Heme/Allergies: Negative for environmental allergies and polydipsia. Does not bruise/bleed easily.  SUBJECTIVE:  Breathing is ok if resting, but desaturates with minimal activity. States the long term effects of extended illness have depressed him.  VITAL SIGNS: Temp:  [96.5 F (35.8 C)-98.2 F (36.8 C)] 96.5 F (35.8 C) (03/25 1200) Pulse Rate:  [59-88] 67 (03/25 1200) Resp:  [12-18] 16 (03/25 1200) BP: (127-170)/(72-95) 150/81 (03/25 1200) SpO2:  [86 %-95 %] 95 % (03/25 1200) Weight:  [177 lb 4 oz (  80.4 kg)] 177 lb 4 oz (80.4 kg) (03/25 0500)  PHYSICAL EXAMINATION: General:  Awake and alert on [place], deconditioned , able to speak in full sentences. Neuro:   MAE x 4, A&O x 3, appropriate, follows commands HEENT:Normocephalic, atraumatic, poor dentation Cardiovascular:  RRR, no rubs, gallops or murmur. Lungs: Rhonchi, diminished per bases, faint wheezes and crackles noted  Abdomen:  Obese, non-tender, BS+ Musculoskeletal: No obvious deformities noted, deconditioned Skin:  Thin, intact with bruising noted, peripheral IV's   Recent Labs Lab 02/25/2017 1914 03/20/17 0408 03/21/17 0237  NA 130* 133* 132*  K 4.1 3.7 3.6  CL 88* 90* 89*  CO2 _0 BUN 23* 20 28*  CREATININE 1.42* 1.21 1.06  GLUCOSE 131* 139* 132*    Recent Labs Lab 03/22/2017 1914 03/20/17 0408 03/21/17 0237  HGB 10.6* 9.9* 9.9*  HCT 33.0* 30.9* 30.6*  WBC 16.2* 14.3* 16.0*  PLT 553* 478* 499*   Dg Chest 2 View  Result Date: 02/28/2017 CLINICAL DATA:  Shortness of breath. Status post left upper lobectomy. EXAM: CHEST  2 VIEW COMPARISON:  Radiograph of March 14, 2017. FINDINGS: Stable cardiomediastinal silhouette. Postsurgical changes are again noted in the left hemothorax with some degree of volume loss present. Stable left upper lobe and lower lobe airspace opacities are noted concerning for pneumonia or atelectasis. No pneumothorax is noted. Stable diffuse interstitial densities noted throughout the right lung concerning for edema or inflammation. Bony thorax is unremarkable. IMPRESSION: Stable left lung opacity is noted concerning combination of postsurgical changes as well as pneumonia or atelectasis. Stable right lung interstitial densities are also noted concerning for edema or inflammation. Electronically Signed   By: Marijo Conception, M.D.   On: 03/18/2017 20:21   Ct Chest Wo Contrast  Result Date: 03/20/2017 CLINICAL DATA:  Hypoxia, increased shortness of breath today, lung cancer EXAM: CT CHEST WITHOUT CONTRAST TECHNIQUE: Multidetector CT imaging of the chest was performed following the standard protocol without IV contrast. COMPARISON:  02/15/2017 and  03/15/2017 FINDINGS: Cardiovascular: Atherosclerotic calcifications of thoracic aorta again noted. Cardiomegaly. No pericardial effusion. 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 Mediastinum/Nodes: No mediastinal adenopathy. Large hiatal hernia again noted. Lungs/Pleura: Again noted postsurgical changes post left upper lobectomy. Stable peripheral thickening of interlobular septa and subpleural blebs bilaterally. Again noted diffuse patchy airspace disease bilaterally highly suspicious for superimpose pneumonia. There is improvement from prior exam especially bilateral lower lobe superior segments. No convincing pulmonary edema. There is no pneumothorax. Axial image 40 there is nodular pleural based consolidation in right lower lobe anteriorly. Recurrent tumor is highly suspicious. Further correlation with PET scan is recommended. Again noted some residual infiltrate/pneumonia in left lower lobe posterior medially. Mild bronchiectasis bilaterally stable. Upper Abdomen: Visualized upper abdomen shows no adrenal gland mass. Musculoskeletal: Sagittal images of the spine shows mild degenerative changes thoracic spine. No destructive bony lesions are noted. Axial image 70 stable healing fracture deformity of the left fifth rib. IMPRESSION: 1. Again noted postsurgical changes post left upper lobectomy. Stable peripheral thickening of interlobular septa and subpleural blebs bilaterally. Again noted diffuse patchy airspace disease bilaterally highly suspicious for superimposed pneumonia. There is improvement from prior exam especially bilateral lower lobe superior segments. There is residual infiltrate in left lower lobe posterior medially please see axial image 105 lung windows. 2. There is nodular pleural based consolidation in left lower lobe anteriorly axial image 40 measures 2.8 cm. This is highly suspicious for recurrent tumor. Further correlation  with PET scan is recommended.  3. Stable healing fracture deformity of the left fifth rib. Please see axial image 70. 4. Large hiatal hernia again noted. Electronically Signed   By: Lahoma Crocker M.D.   On: 03/20/2017 13:51   Ct Angio Chest Pe W Or Wo Contrast  Result Date: 03/21/2017 CLINICAL DATA:  71 year old with hypoxia. 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 Status post left upper lobectomy. EXAM: CT ANGIOGRAPHY CHEST WITH CONTRAST TECHNIQUE: Multidetector CT imaging of the chest was performed using the standard protocol during bolus administration of intravenous contrast. Multiplanar CT image reconstructions and MIPs were obtained to evaluate the vascular anatomy. CONTRAST:  100 mL Isovue 370 COMPARISON:  03/14/2017 and 03/20/2017. FINDINGS: Cardiovascular: Negative for a pulmonary embolism. 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 No evidence for an aortic dissection. Great vessels are tortuous and patent. Enlargement of the left atrium measuring 6.0 cm in the AP dimension. Mediastinum/Nodes: Again noted is a large hiatal hernia. Few prominent lymph nodes in the prevascular space are unchanged. Overall, no significant mediastinal lymphadenopathy. Difficult to exclude mild enlargement of hilar lymph nodes. No significant pericardial fluid. Lungs/Pleura: Prior left upper lobectomy. Trachea and mainstem bronchi are patent. Bilateral patchy parenchymal airspace disease. Increased densities in both lungs compared to the most recent comparison examination which may be related to differences in technique and expiratory phase based on the appearance of the trachea and mainstem bronchi. However, it is difficult to exclude some progression in the right lung. Again noted is a pleural-based low-density structure the lateral left chest that measures 3.2 x 2.2 cm on sequence 5, image 31. This area is more conspicuous than it was on the exam from 02/15/2017. Upper Abdomen: No acute abnormality in upper  abdomen. Musculoskeletal: Old fracture involving the left fifth rib compatible with left thoracotomy changes. Review of the MIP images confirms the above findings. IMPRESSION: Negative for pulmonary embolism. Diffuse parenchymal disease throughout both lungs suggestive for pneumonia. Increased densities in both lungs probably related to expiratory phase of imaging. Pleural-based nodular density in the left hemithorax. This has been present since the left upper lobectomy and could represent focal loculated pleural fluid but nonspecific. Recommend close follow-up of this area. Large hiatal hernia. 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 Electronically Signed   By: Markus Daft M.D.   On: 03/21/2017 12:39   US Renal  Result Date: 03/20/2017 CLINICAL DATA:  Acute onset of renal insufficiency. Microhematuria. Initial encounter. EXAM: RENAL / URINARY TRACT ULTRASOUND COMPLETE COMPARISON:  PET/CT performed 01/04/2017 FINDINGS: Right Kidney: Length: 10.2 cm. Echogenicity within normal limits. No mass or hydronephrosis visualized. Left Kidney: Length: 1.9 cm. Echogenicity within normal limits. No mass or hydronephrosis visualized. Bladder: Appears normal for degree of bladder distention. IMPRESSION: Unremarkable renal ultrasound. Electronically Signed   By: Garald Balding M.D.   On: 03/20/2017 03:25    ASSESSMENT / PLAN: A: Multifactorial Acute  on Chronic Respiratory Failure with Hypoxia (HAP/ CHF/ Underlying COPD/ Recent Lobectomy)  Coughing up green secretions CTA negative for PE 3/25 Baseline home oxygen 4L Extremely deconditioned  P: Wean oxygen to maintain oxygen saturations 88-92% BIPAP prn Continue Vanc and Cefepime Trend Procalcitonin/ WBC/ Fever Lactate Follow Blood Cultures  Agree with SLP to RO aspiration events Consider Scheduled DuoNebs vs prn OK to stop steroids for now as no wheezing, but low threshold for re-start if worsens. Consider sending home on maintenance  medication for COPD:  ICS/LABA Sputum Culture  ILD work up PT/OT as able Mobilize  Aggressive Pulmonary Toilet/ IS Meeting with Palliative Care 3/26 at 10 am to align goals of care with plan of care  A: HO Stage 1B [place] Lung Cancer P: Will need follow up with oncology / repeat imaging regarding concerning pleural based LLL  Nodule when pneumonia has resolved PET as outpatient  A:  Acute on Chronic Diastolic HF Weight gain of 5 pounds in < 24 hours P: Continue Lasix Maintain euvolemia Daily weights Trend BNP prn  Magdalen Spatz, AGACNP-BC [facility] [place] Pager: [phone]  03/21/2017, 4:16 PM

## 2017-03-21 NOTE — Progress Notes (Addendum)
ANTICOAGULATION CONSULT NOTE - Initial Consult  Pharmacy Consult for Lovenox  Indication: hx afib on Apixaban > now with concern for PE per MD   Allergies  Allergen Reactions  . Colchicine Diarrhea    Patient Measurements: Height: '5\' 7"'$  (170.2 cm) Weight: 177 lb 4 oz (80.4 kg) IBW/kg (Calculated) : 66.1 Heparin Dosing Weight: 83 kg  Vital Signs: Temp: 98.2 F (36.8 C) (03/25 0300) Temp Source: Axillary (03/25 0300) BP: 141/75 (03/25 0720) Pulse Rate: 59 (03/25 0720)  Labs:  Recent Labs  03/02/2017 1914  03/04/2017 2344 03/20/17 0230 03/20/17 0408 03/21/17 0237  HGB 10.6*  --   --   --  9.9* 9.9*  HCT 33.0*  --   --   --  30.9* 30.6*  PLT 553*  --   --   --  478* 499*  CREATININE 1.42*  --   --   --  1.21 1.06  TROPONINI  --   < > 0.03* <0.03 <0.03  --   < > = values in this interval not displayed.  Estimated Creatinine Clearance: 64.9 mL/min (by C-G formula based on SCr of 1.06 mg/dL).   Medical History: Past Medical History:  Diagnosis Date  . Acute respiratory failure ([place])   . Arthritis   . Asthma   . Atrial fibrillation ([place])   . Atypical chest pain    a. Normal nuc 2014.  . Carotid artery disease ([place])    a. Carotid duplex 2015: 58-85% RICA, 0-27% LICA.   [place] Kitchen Cataract   . CHF (congestive heart failure) ([place])   . Coronary artery calcification seen on CT scan   . Depression   . Detached retina   . Former consumption of alcohol   . Former tobacco use   . GERD (gastroesophageal reflux disease)    "I take heart burn medicine"  . Glaucoma   . Hemorrhoids 03/09/2017  . Hyperlipemia   . Hypertension   . Low back pain 12/29/2016  . Mass of upper lobe of left lung 12/29/2016  . PVD (peripheral vascular disease) ([place])    a. Mild plaque of iliacs in 2013 on duplex; PET 2018:  PET also corroborated coronary, aortic arch, and branch vessel atherosclerotic vascular disease as well as aortoiliac atherosclerotic vascular disease.  . Sleep apnea    has not gotten CPAP  yet    Assessment: 71 yo male admitted with DOE. On PTA Apixaban for hx afib. Was receiving inpatient, with last dose at 2130 on 3/25. Pharmacy consulted to dose VTE treatment dose Lovenox starting this morning. Per discussion with Dr. Broadus John, concern for possible PE d/t persistent hypoxia of unknown origin. SCr 1.06 and CrCl ~ 60-65 mL/min. Hgb 9.9 and stable. Platelets within normal limits. No s/s bleeding noted.   Goal of Therapy:  Anti-Xa level 0.6-1 units/ml 4hrs after LMWH dose given Monitor platelets by anticoagulation protocol: Yes   Plan:  Start lovenox '1mg'$ /kg subcutaneous q12h   D/C apixaban (done by MD) Monitor renal function and for s/s bleeding  Argie Ramming, PharmD Pharmacy Resident  Pager [phone] 03/21/17 9:32 AM

## 2017-03-22 ENCOUNTER — Inpatient Hospital Stay (HOSPITAL_COMMUNITY): Payer: Medicare Other

## 2017-03-22 DIAGNOSIS — Z7189 Other specified counseling: Secondary | ICD-10-CM

## 2017-03-22 DIAGNOSIS — I5031 Acute diastolic (congestive) heart failure: Secondary | ICD-10-CM

## 2017-03-22 DIAGNOSIS — Z515 Encounter for palliative care: Secondary | ICD-10-CM

## 2017-03-22 LAB — CBC
HEMATOCRIT: 31.5 % — AB (ref 39.0–52.0)
Hemoglobin: 9.9 g/dL — ABNORMAL LOW (ref 13.0–17.0)
MCH: 27.5 pg (ref 26.0–34.0)
MCHC: 31.4 g/dL (ref 30.0–36.0)
MCV: 87.5 fL (ref 78.0–100.0)
PLATELETS: 550 10*3/uL — AB (ref 150–400)
RBC: 3.6 MIL/uL — ABNORMAL LOW (ref 4.22–5.81)
RDW: 15.2 % (ref 11.5–15.5)
WBC: 15 10*3/uL — AB (ref 4.0–10.5)

## 2017-03-22 LAB — LACTIC ACID, PLASMA: LACTIC ACID, VENOUS: 1.5 mmol/L (ref 0.5–1.9)

## 2017-03-22 LAB — SEDIMENTATION RATE: Sed Rate: 119 mm/hr — ABNORMAL HIGH (ref 0–16)

## 2017-03-22 LAB — BASIC METABOLIC PANEL
Anion gap: 12 (ref 5–15)
BUN: 30 mg/dL — AB (ref 6–20)
CO2: 34 mmol/L — ABNORMAL HIGH (ref 22–32)
CREATININE: 1.08 mg/dL (ref 0.61–1.24)
Calcium: 8.6 mg/dL — ABNORMAL LOW (ref 8.9–10.3)
Chloride: 89 mmol/L — ABNORMAL LOW (ref 101–111)
GFR calc Af Amer: >60 mL/min (ref >60)
Glucose, Bld: 102 mg/dL — ABNORMAL HIGH (ref 65–99)
POTASSIUM: 3.3 mmol/L — AB (ref 3.5–5.1)
SODIUM: 135 mmol/L (ref 135–145)

## 2017-03-22 MED ORDER — ACETAMINOPHEN 650 MG RE SUPP: 650.0000 mg | Freq: Four times a day (QID) | RECTAL | Status: DC | PRN

## 2017-03-22 MED ORDER — GLYCOPYRROLATE 0.2 MG/ML IJ SOLN
0.2000 mg | INTRAMUSCULAR | Status: DC | PRN
  Administered 2017-03-22: 0.2 mg via INTRAVENOUS
  Filled 2017-03-22: qty 1

## 2017-03-22 MED ORDER — ONDANSETRON 4 MG PO TBDP: 4.0000 mg | ORAL_TABLET | Freq: Four times a day (QID) | ORAL | Status: DC | PRN

## 2017-03-22 MED ORDER — LORAZEPAM 2 MG/ML IJ SOLN
INTRAMUSCULAR | Status: AC
  Administered 2017-03-22: 0.5 mg via INTRAVENOUS
  Filled 2017-03-22: qty 1

## 2017-03-22 MED ORDER — BIOTENE DRY MOUTH MT LIQD: 15.0000 mL | OROMUCOSAL | Status: DC | PRN

## 2017-03-22 MED ORDER — LORAZEPAM 2 MG/ML IJ SOLN
0.5000 mg | INTRAMUSCULAR | Status: AC
  Administered 2017-03-22 (×2): 0.5 mg via INTRAVENOUS

## 2017-03-22 MED ORDER — MORPHINE SULFATE (PF) 2 MG/ML IV SOLN
2.0000 mg | INTRAVENOUS | Status: DC | PRN
Start: 2017-03-22 — End: 2017-03-23
  Administered 2017-03-22 (×2): 2 mg via INTRAVENOUS
  Filled 2017-03-22 (×2): qty 1

## 2017-03-22 MED ORDER — GLYCOPYRROLATE 1 MG PO TABS: 1.0000 mg | ORAL_TABLET | ORAL | Status: DC | PRN

## 2017-03-22 MED ORDER — SODIUM CHLORIDE 0.9% FLUSH
3.0000 mL | Freq: Two times a day (BID) | INTRAVENOUS | Status: DC
Start: 2017-03-22 — End: 2017-03-23
  Administered 2017-03-22: 3 mL via INTRAVENOUS

## 2017-03-22 MED ORDER — HALOPERIDOL LACTATE 2 MG/ML PO CONC: 0.5000 mg | ORAL | Status: DC | PRN

## 2017-03-22 MED ORDER — MORPHINE SULFATE (PF) 4 MG/ML IV SOLN
8.0000 mg | INTRAVENOUS | Status: AC
  Administered 2017-03-22: 8 mg via INTRAVENOUS

## 2017-03-22 MED ORDER — ONDANSETRON HCL 4 MG/2ML IJ SOLN: 4.0000 mg | Freq: Four times a day (QID) | INTRAMUSCULAR | Status: DC | PRN

## 2017-03-22 MED ORDER — GLYCOPYRROLATE 0.2 MG/ML IJ SOLN: 0.2000 mg | INTRAMUSCULAR | Status: DC | PRN

## 2017-03-22 MED ORDER — MORPHINE SULFATE 25 MG/ML IV SOLN
2.0000 mg/h | INTRAVENOUS | Status: DC
  Administered 2017-03-22: 2 mg/h via INTRAVENOUS
  Filled 2017-03-22: qty 10

## 2017-03-22 MED ORDER — SODIUM CHLORIDE 0.9% FLUSH: 3.0000 mL | INTRAVENOUS | Status: DC | PRN

## 2017-03-22 MED ORDER — MORPHINE BOLUS VIA INFUSION
4.0000 mg | INTRAVENOUS | Status: DC | PRN
  Administered 2017-03-23: 4 mg via INTRAVENOUS
  Filled 2017-03-22: qty 4

## 2017-03-22 MED ORDER — MORPHINE SULFATE (PF) 4 MG/ML IV SOLN
INTRAVENOUS | Status: AC
  Filled 2017-03-22: qty 2

## 2017-03-22 MED ORDER — SODIUM CHLORIDE 0.9 % IV SOLN: 250.0000 mL | INTRAVENOUS | Status: DC | PRN

## 2017-03-22 MED ORDER — POLYVINYL ALCOHOL 1.4 % OP SOLN: 1.0000 [drp] | Freq: Four times a day (QID) | OPHTHALMIC | Status: DC | PRN

## 2017-03-22 MED ORDER — METHYLPREDNISOLONE SODIUM SUCC 125 MG IJ SOLR
125.0000 mg | Freq: Four times a day (QID) | INTRAMUSCULAR | Status: DC
  Administered 2017-03-22: 125 mg via INTRAVENOUS
  Filled 2017-03-22: qty 2

## 2017-03-22 MED ORDER — ACETAMINOPHEN 325 MG PO TABS: 650.0000 mg | ORAL_TABLET | Freq: Four times a day (QID) | ORAL | Status: DC | PRN

## 2017-03-22 MED ORDER — HALOPERIDOL LACTATE 5 MG/ML IJ SOLN: 0.5000 mg | INTRAMUSCULAR | Status: DC | PRN

## 2017-03-22 MED ORDER — HALOPERIDOL 0.5 MG PO TABS: 0.5000 mg | ORAL_TABLET | ORAL | Status: DC | PRN

## 2017-03-22 MED ORDER — LORAZEPAM 2 MG/ML IJ SOLN
0.5000 mg | INTRAMUSCULAR | Status: DC | PRN
  Administered 2017-03-23: 0.5 mg via INTRAVENOUS
  Filled 2017-03-22: qty 1

## 2017-03-22 NOTE — Progress Notes (Signed)
Transition patient to comfort care. Notified CMT. Leonard Downing, NP is at bedside. Will start morphine gtt. Removed oxygen probe from finger as it was stressing patient. NP aware of this. Provided 0.'5mg'$  Ativan as well as '4mg'$  of Morphine as ordered.   Family is at bedside and compassion cart was ordered.  At this time palliative is on board and navigating symptom management.   Will continue to monitor for needs.

## 2017-03-22 NOTE — Progress Notes (Signed)
Daily Progress Note   Patient Name: Paul Valdez       Date: 03/22/2017 DOB: 1946-03-01  Age: 71 y.o. MRN#: [id number] Attending Physician: Domenic Polite, MD Primary Care Physician: Gara Kroner, MD Admit Date: 03/18/2017  Reason for Consultation/Follow-up: Establishing goals of care  Subjective: Met with patient, Roslynn Amble and friend, Lattie Haw. Discussed patient's overall health status. He has had difficulty recovering from lung lobectomy in February 2018. Since surgery he has had ongoing complications with HCAP and CHF complicated by A fib. And COPD. Although the patient reports good functional status prior to surgery his friends state he was severely debilitated and deconditioned due to back pain and they provided much care for him. Since surgery patient has functionally declined even more. His cognitive and nutritional status has also declined significantly. He has lost 26 lbs (weighing 203 2/16 and 177 today; albumin 2.5 on 3/23). Friends report changes in personality and mentation, with confusion at times.  We discussed that he has been admitted to the hospital 4 times in the last six months. Patient is frustrated and tired, although he is maintaining a good sense of humor.  Discussed poor prognosis- difficult recovery process, limited functional tolerance- chronic respiratory failure.  Discussed options of continued aggressive care, future hospitalizations, SNF/Rehab vs. Hospice/comfort/symptom management. Patient elicited Roundup to medically optimize during this admission and then proceed home with Hospice care. Lattie Haw and [name] are going to work on arranging 24 hr caregivers to be present. Discussed Code status- patient elects DNR.  Review of Systems  Constitutional: Positive for  malaise/fatigue and weight loss.  Respiratory: Positive for sputum production and shortness of breath. Cough:     Psychiatric/Behavioral: Negative for suicidal ideas.    Length of Stay: 3  Current Medications: Scheduled Meds:  . atorvastatin  40 mg Oral QPM  . ceFEPime (MAXIPIME) IV  1 g Intravenous Q8H  . diltiazem  180 mg Oral Daily  . enoxaparin (LOVENOX) injection  1 mg/kg Subcutaneous Q12H  . flecainide  100 mg Oral Q12H  . furosemide  40 mg Intravenous Q12H  . gabapentin  600 mg Oral TID  . pantoprazole  40 mg Oral Daily  . potassium chloride SA  40 mEq Oral Daily  . sertraline  100 mg Oral Daily  . tamsulosin  0.4 mg Oral QPC supper  .  vancomycin  750 mg Intravenous Q12H    Continuous Infusions:   PRN Meds: acetaminophen, ipratropium-albuterol, LORazepam  Physical Exam  Constitutional: He is oriented to person, place, and time. He appears well-developed and well-nourished.  HENT:  Head: Normocephalic and atraumatic.  Cardiovascular: Normal rate and regular rhythm.   Neurological: He is alert and oriented to person, place, and time.  Skin: Skin is warm and dry.  Psychiatric: He has a normal mood and affect. His behavior is normal. Judgment and thought content normal.            Vital Signs: BP 137/76   Pulse 72   Temp 98.4 F (36.9 C) (Oral)   Resp 18   Ht '5\' 7"'$  (1.702 m)   Wt 80.3 kg (177 lb)   SpO2 (!) 84%   BMI 27.72 kg/m  SpO2: SpO2: (!) 84 % O2 Device: O2 Device: High Flow Nasal Cannula O2 Flow Rate: O2 Flow Rate (L/min): 15 L/min  Intake/output summary:  Intake/Output Summary (Last 24 hours) at 03/22/17 1128 Last data filed at 03/22/17 9417  Gross per 24 hour  Intake              740 ml  Output             3300 ml  Net            -2560 ml   LBM: Last BM Date: 03/20/17 Baseline Weight: Weight: 85.4 kg (188 lb 4 oz) Most recent weight: Weight: 80.3 kg (177 lb)       Palliative Assessment/Data: PPS: 30%   Flowsheet Rows     Most Recent  Value  Intake Tab  Referral Department  Hospitalist  Unit at Time of Referral  Cardiac/Telemetry Unit  Palliative Care Primary Diagnosis  Cardiac  Date Notified  03/20/17  Palliative Care Type  New Palliative care  Reason for referral  Clarify Goals of Care, Advance Care Planning  Date of Admission  03/02/2017  Date first seen by Palliative Care  03/21/17  # of days Palliative referral response time  1 Day(s)  # of days IP prior to Palliative referral  1  Clinical Assessment  Palliative Performance Scale Score  40%  Pain Max last 24 hours  6  Pain Min Last 24 hours  2  Dyspnea Max Last 24 Hours  8  Dyspnea Min Last 24 hours  4  Nausea Min Last 24 Hours  0  Anxiety Max Last 24 Hours  3  Anxiety Min Last 24 Hours  0  Psychosocial & Spiritual Assessment  Palliative Care Outcomes  Patient/Family meeting held?  Yes  Who was at the meeting?  pt,  FU mtg scheduled for 3/26 @ 10am  Palliative Care follow-up planned  Yes, Facility      Patient Active Problem List   Diagnosis Date Noted  . Palliative care encounter   . Respiratory failure ([place]) 03/11/2017  . Acute on chronic respiratory failure with hypoxia ([place]) 02/25/2017  . Anticoagulated   . Dyspnea   . Hypokalemia 03/14/2017  . Acute respiratory distress 03/13/2017  . Acute respiratory failure ([place]) 03/12/2017  . Anemia 03/12/2017  . Hemorrhoids 03/09/2017  . Lung cancer ([place]) 02/15/2017  . Squamous cell carcinoma lung, left ([place]) 02/15/2017  . Acute diastolic congestive heart failure ([place]) 02/15/2017  . Complicated UTI (urinary tract infection) 02/15/2017  . Left rib fracture 02/15/2017  . Elevated brain natriuretic peptide (BNP) level   . HCAP (healthcare-associated pneumonia)   . Lobar  pneumonia ([place])   . Persistent atrial fibrillation with rapid ventricular response ([place]) 02/12/2017  . Atelectasis   . OSA (obstructive sleep apnea)   . S/P lobectomy of lung 02/01/2017  . Coronary artery calcification seen on CT scan  01/13/2017  . PVD (peripheral vascular disease) ([place]) 01/13/2017  . Essential hypertension 01/13/2017  . Hyperlipidemia 01/13/2017  . Nodule of left lung 12/29/2016  . Low back pain 12/29/2016  . Carotid artery disease ([place]) 11/21/2014    Palliative Care Assessment & Plan   Patient Profile: 71 y.o. male  with past medical history of Arthritis, asthma, COPD, atrial fibrillation with cardioversion on 03/16/2017 carotid artery disease, congestive heart failure, GERD, hyperlipidemia, hypertension, low back pain, sleep apnea, lung cancer to left upper lobe with lobectomy 02/01/2017 admitted on 03/21/2017 with shortness of breath, reported 5 pound weight gain in 24 hours. Mr. Vanauken has had several admissions in 2018 beginning 02/01/2017 when he was diagnosed with lung cancer. Since that time he states "I've been told I have COPD, atrial fib, congestive heart failure". He is stating he feels debilitated, has bilateral leg weakness, "my legs just give out when I try to walk"..   Assessment/Recommendations/Plan   DNR  Medically optimize this admission then d/c home with Hospice   Code Status:  DNR  Prognosis:   < 6 months d/t PPS: 30%; significantly decreased cognitive, functional and nutritional status, multiple concurrent comorbidities- COPD, CHF, lung ca, weight loss >10% in the last month, albumin 2.5  Discharge Planning:  Home with Hospice  Care plan was discussed with patient, HCPOA and friend.  Thank you for allowing the Palliative Medicine Team to assist in the care of this patient.   Time In: 1000 Time Out: 1200 Total Time 120 minutes Prolonged Time Billed Yes      Greater than 50%  of this time was spent counseling and coordinating care related to the above assessment and plan.  Mariana Kaufman, AGNP-C Palliative Medicine   Please contact Palliative Medicine Team phone at [phone] for questions and concerns.

## 2017-03-22 NOTE — Clinical Social Work Note (Signed)
CSW acknowledges consult "Readmitted. Lives alone with his dog." Please enter PT order if there are concerns that he may need placement. Patient was discharged to [facility] on 2/16-2/19 and was recommended for HHPT for admissions following that.  Dayton Scrape, [place]

## 2017-03-22 NOTE — Progress Notes (Signed)
Palliative Medicine Team Progress Note  Called by Lanetta Inch, RN from [facility]- notified that patient's condition had worsened- patient and family had consulted with Dr. Elsworth Soho and chosen comfort measures- not certain if patient would survive the night. Patient very uncomfortable, with air hunger, agitation.  Assessed patient at bedside. Audible wheezing, visible increased WOB, pulling at wires, not following commands, lips blue. Family confirms transition to comfort measures. Requesting morphine drip.  Comfort measure orders entered. Morphine infusion with prn boluses ordered.   Prognosis likely hours to days. Recommend titrate O2 down with opioids and benzos for comfort. Do not check O2 sats.   Care plan discussed with HCPOA- [place], family member- Lattie Haw, and Jarrett Soho, Therapist, sports.  Mariana Kaufman, AGNP-C Palliative Medicine  Please call Palliative Medicine team phone with any questions [phone]. For individual providers please see AMION.

## 2017-03-22 NOTE — Progress Notes (Addendum)
PROGRESS NOTE    Paul Valdez  LPF:[id number] DOB: 1946/02/16 DOA: 03/18/2017 PCP: Gara Kroner, MD  Brief Narrative:71/M with COPD/chronic resp failure on 4L O2, Diastolic CHF, Afib on eliquis,  Lung CA -stage 1B  s/p upper lobe lobectomy in 2/18 , readmitted within 24hours of discharge -last admission 3/16-3/22 with Acute diastolic CHF/ HCAP/AFib RVR s/p diuresis-negative 9L at discharge, s/p cardioversion  To NSR and started Flecainide, Abx therapy, back with worsening Hypoxia, now on BIPAP and Non re breather mask, CXR without overt new findings, pt and friend report compliance with all meds. Non contrast CT chest again noted diffuse patchy airspace disease bilaterally highly suspicious for superimposed pneumonia. There is improvement from prior exam especially bilateral lower lobe superior segments, residual infiltrate in left lower lobe posterior medially, nodular pleural based consolidation in left lower lobe anteriorly axial image 40 measures 2.8 cm.   Assessment & Plan:   Principal Problem:   Acute on chronic respiratory failure with hypoxia (HCC) -Off BiPAP, nonrebreather and now high flow O2-15L-approx 70% -multifactorial, mild component of diastolic CHF, ? HCAP vs Interstitial lung dz and underlying COPD -weight 7lbs above discharge wt on re-admission and diffuse air space dz, ?Pneumonia findings on CT not different from last week-I wonder if he has a component of ILD, PCCM consulted 3/25 -continue IV Lasix, back on Vanc and Cefepime -will stop Vanc today, continue Cefepime Day 3, Procalcitonin 0.31 -given high degree of Hypoxia and pleuritic chest pain yesterday am, got CTA chest which was negative for PE -keep on Lovenox for now, change back to PO apixaban in 1-2days -Palliative consulted, DNR now  H/o Stage 1B [place] Lung CA -s/p lobectomy of lung in February -? Possibility of recurrence based on CT-will need PET down the road  Acute on Chronic diastolci CHF -s/p aggressive  diureses last admit, 9 L negative at the time of discharge -Appears mostly euvolemic at this time, however will keep him on IV Lasix to keep him net even to dry, 3.9L negative now but O2 need not much better  COPD/chronic respiratory failure -No wheezing stopped IV steroids -Nebs when necessary  Paroxysmal atrial fibrillation -Status post DC cardioversion last week and now on Flecainide -Remains in sinus rhythm, changed Apixaban to subcutaneous Lovenox , change back in few days when resps status more stable   Adult failure to thrive -Multiple cardiopulmonary issues, frequent readmission, chronic respiratory failure, discussed in detail with POA yesterday along with patient and requested palliative consult for goals of care, meeting today  DVT prophylaxis: Full dose Lovenox  Code Status: DNR Family Communication:No family at bedside discussed with POA [name]  Disposition Plan: Keep in stepdown for now   Consultants:   PCCM   Procedures:   Antimicrobials:  Deniece Ree Clois Dupes 3/23-now  Subjective: Still feels short of breath, now on high flow O2, off BIPAP yesterday  Objective: Vitals:   03/22/17 0900 03/22/17 1015 03/22/17 1151 03/22/17 1256  BP:  137/76 127/64   Pulse:  72 79   Resp:  18 14   Temp:  98.4 F (36.9 C) 98.4 F (36.9 C)   TempSrc: Oral Oral Oral   SpO2: (!) 88% (!) 84% (!) 88% (!) 84%  Weight:      Height:        Intake/Output Summary (Last 24 hours) at 03/22/17 1310 Last data filed at 03/22/17 0925  Gross per 24 hour  Intake              290 ml  Output             3300 ml  Net            -3010 ml   Filed Weights   03/20/17 0441 03/21/17 0500 03/22/17 0500  Weight: 80.1 kg (176 lb 9.4 oz) 80.4 kg (177 lb 4 oz) 80.3 kg (177 lb)    Examination:  General exam: Appears calm, but uncomfortable, out of breath after few words, more lucid this am Respiratory system: decreased BS at bases, no wheezes, faint basilar crackles Cardiovascular system: S1 & S2  heard, RRR. No JVD,  Gastrointestinal system: Abdomen is nondistended, soft and nontender. Normal bowel sounds heard. Central nervous system: Alert and oriented. No focal neurological deficits. Extremities: Symmetric 5 x 5 power, no edema Skin: No rashes, lesions or ulcers Psychiatry: Judgement and insight appear normal. Mood & affect appropriate.     Data Reviewed:   CBC:  Recent Labs Lab 03/18/17 0035 03/10/2017 1914 03/20/17 0408 03/21/17 0237 03/22/17 0314  WBC 9.1 16.2* 14.3* 16.0* 15.0*  NEUTROABS  --   --  12.6*  --   --   HGB 10.4* 10.6* 9.9* 9.9* 9.9*  HCT 32.3* 33.0* 30.9* 30.6* 31.5*  MCV 88.5 86.6 86.8 86.4 87.5  PLT 463* 553* 478* 499* 410*   Basic Metabolic Panel:  Recent Labs Lab 03/18/17 0035 03/24/2017 1914 03/20/17 0408 03/21/17 0237 03/22/17 0314  NA 134* 130* 133* 132* 135  K 4.1 4.1 3.7 3.6 3.3*  CL 91* 88* 90* 89* 89*  CO2 33* '28 30 30 '$ 34*  GLUCOSE 113* 131* 139* 132* 102*  BUN 18 23* 20 28* 30*  CREATININE 1.07 1.42* 1.21 1.06 1.08  CALCIUM 9.0 9.0 8.7* 8.9 8.6*   GFR: Estimated Creatinine Clearance: 63.7 mL/min (by C-G formula based on SCr of 1.08 mg/dL). Liver Function Tests:  Recent Labs Lab 02/25/2017 1921  AST 35  ALT 26  ALKPHOS 93  BILITOT 0.6  PROT 6.7  ALBUMIN 2.5*   No results for input(s): LIPASE, AMYLASE in the last 168 hours. No results for input(s): AMMONIA in the last 168 hours. Coagulation Profile: No results for input(s): INR, PROTIME in the last 168 hours. Cardiac Enzymes:  Recent Labs Lab 03/11/2017 1921 03/24/2017 2344 03/20/17 0230 03/20/17 0408  TROPONINI 0.33* 0.03* <0.03 <0.03   BNP (last 3 results) No results for input(s): PROBNP in the last 8760 hours. HbA1C: No results for input(s): HGBA1C in the last 72 hours. CBG: No results for input(s): GLUCAP in the last 168 hours. Lipid Profile: No results for input(s): CHOL, HDL, LDLCALC, TRIG, CHOLHDL, LDLDIRECT in the last 72 hours. Thyroid Function  Tests: No results for input(s): TSH, T4TOTAL, FREET4, T3FREE, THYROIDAB in the last 72 hours. Anemia Panel: No results for input(s): VITAMINB12, FOLATE, FERRITIN, TIBC, IRON, RETICCTPCT in the last 72 hours. Urine analysis:    Component Value Date/Time   COLORURINE YELLOW 03/21/2017 2143   APPEARANCEUR HAZY (A) 03/08/2017 2143   LABSPEC 1.015 03/08/2017 2143   PHURINE 5.0 03/04/2017 2143   GLUCOSEU NEGATIVE 03/09/2017 2143   HGBUR LARGE (A) 03/20/2017 2143   BILIRUBINUR NEGATIVE 03/11/2017 2143   KETONESUR NEGATIVE 03/09/2017 2143   PROTEINUR NEGATIVE 03/05/2017 2143   NITRITE NEGATIVE 03/09/2017 2143   LEUKOCYTESUR TRACE (A) 03/24/2017 2143   Sepsis Labs: '@LABRCNTIP'$ (procalcitonin:4,lacticidven:4)  ) Recent Results (from the past 240 hour(s))  MRSA PCR Screening     Status: None   Collection Time: 03/14/17 10:37 AM  Result Value Ref Range  Status   MRSA by PCR NEGATIVE NEGATIVE Final    Comment:        The GeneXpert MRSA Assay (FDA approved for NASAL specimens only), is one component of a comprehensive MRSA colonization surveillance program. It is not intended to diagnose MRSA infection nor to guide or monitor treatment for MRSA infections.   Culture, blood (routine x 2) Call MD if unable to obtain prior to antibiotics being given     Status: None (Preliminary result)   Collection Time: 03/27/2017 11:34 PM  Result Value Ref Range Status   Specimen Description BLOOD LEFT ANTECUBITAL  Final   Special Requests BOTTLES DRAWN AEROBIC AND ANAEROBIC 10CC  Final   Culture NO GROWTH 1 DAY  Final   Report Status PENDING  Incomplete  Culture, blood (routine x 2) Call MD if unable to obtain prior to antibiotics being given     Status: None (Preliminary result)   Collection Time: 03/18/2017 11:48 PM  Result Value Ref Range Status   Specimen Description BLOOD RIGHT ANTECUBITAL  Final   Special Requests BOTTLES DRAWN AEROBIC AND ANAEROBIC 10CC  Final   Culture NO GROWTH 1 DAY  Final    Report Status PENDING  Incomplete         Radiology Studies: Ct Angio Chest Pe W Or Wo Contrast  Result Date: 03/21/2017 CLINICAL DATA:  71 year old with hypoxia. 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 Status post left upper lobectomy. EXAM: CT ANGIOGRAPHY CHEST WITH CONTRAST TECHNIQUE: Multidetector CT imaging of the chest was performed using the standard protocol during bolus administration of intravenous contrast. Multiplanar CT image reconstructions and MIPs were obtained to evaluate the vascular anatomy. CONTRAST:  100 mL Isovue 370 COMPARISON:  03/14/2017 and 03/20/2017. FINDINGS: Cardiovascular: Negative for a pulmonary embolism. 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 No evidence for an aortic dissection. Great vessels are tortuous and patent. Enlargement of the left atrium measuring 6.0 cm in the AP dimension. Mediastinum/Nodes: Again noted is a large hiatal hernia. Few prominent lymph nodes in the prevascular space are unchanged. Overall, no significant mediastinal lymphadenopathy. Difficult to exclude mild enlargement of hilar lymph nodes. No significant pericardial fluid. Lungs/Pleura: Prior left upper lobectomy. Trachea and mainstem bronchi are patent. Bilateral patchy parenchymal airspace disease. Increased densities in both lungs compared to the most recent comparison examination which may be related to differences in technique and expiratory phase based on the appearance of the trachea and mainstem bronchi. However, it is difficult to exclude some progression in the right lung. Again noted is a pleural-based low-density structure the lateral left chest that measures 3.2 x 2.2 cm on sequence 5, image 31. This area is more conspicuous than it was on the exam from 02/15/2017. Upper Abdomen: No acute abnormality in upper abdomen. Musculoskeletal: Old fracture involving the left fifth rib compatible with left thoracotomy changes. Review of the  MIP images confirms the above findings. IMPRESSION: Negative for pulmonary embolism. Diffuse parenchymal disease throughout both lungs suggestive for pneumonia. Increased densities in both lungs probably related to expiratory phase of imaging. Pleural-based nodular density in the left hemithorax. This has been present since the left upper lobectomy and could represent focal loculated pleural fluid but nonspecific. Recommend close follow-up of this area. Large hiatal hernia. 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 Electronically Signed   By: Markus Daft M.D.   On: 03/21/2017 12:39   Dg Chest Port 1 View  Result Date:  03/22/2017 CLINICAL DATA:  71 year old male with shortness of breath. EXAM: PORTABLE CHEST 1 VIEW COMPARISON:  Multiple priors, most recently chest x-ray 03/20/2017. FINDINGS: Postoperative changes of left upper lobectomy again noted. Worsening patchy multifocal interstitial and airspace disease throughout the lungs bilaterally, asymmetrically distributed) relative sparing of the apex of the right upper lobe), remains concerning for severe multilobar bronchopneumonia. No pleural effusions. Pulmonary vasculature is largely obscured. Heart size is mildly enlarged (unchanged). The patient is rotated to the left on today's exam, resulting in distortion of the mediastinal contours and reduced diagnostic sensitivity and specificity for mediastinal pathology. Aortic atherosclerosis. IMPRESSION: 1. Worsening multifocal asymmetrically distributed interstitial and airspace disease highly concerning for severe multilobar bronchopneumonia. 2. Mild cardiomegaly. 3. Aortic atherosclerosis. 4. Status post left upper lobectomy. Electronically Signed   By: Vinnie Langton M.D.   On: 03/22/2017 07:23        Scheduled Meds: . atorvastatin  40 mg Oral QPM  . ceFEPime (MAXIPIME) IV  1 g Intravenous Q8H  . diltiazem  180 mg Oral Daily  . enoxaparin (LOVENOX) injection  1 mg/kg Subcutaneous  Q12H  . flecainide  100 mg Oral Q12H  . furosemide  40 mg Intravenous Q12H  . gabapentin  600 mg Oral TID  . pantoprazole  40 mg Oral Daily  . potassium chloride SA  40 mEq Oral Daily  . sertraline  100 mg Oral Daily  . tamsulosin  0.4 mg Oral QPC supper  . vancomycin  750 mg Intravenous Q12H   Continuous Infusions:   LOS: 3 days    Time spent: 75mn    PDomenic Polite MD [facility] Pager [phone] If 7PM-7AM, please contact night-coverage [email or web address] Password [email or web address] 03/22/2017, 1:10 PM

## 2017-03-22 NOTE — Care Management Note (Addendum)
Case Management Note  Patient Details  Name: Paul Valdez MRN: [id number] Date of Birth: Dec 30, 1945  Subjective/Objective:  From home with hx of lung cancer s/p lobectomy 2/5 presents with sob, 5 lb weight loss, COPD, CHF.  NCM received referral for home hospice.  NCM spoke with [name] at the bedside , offered choice, he chose [facility],  NCM made referral to [facility] . Paul Valdez's phone is [phone].  There has been a change in patient status, starting morphine drip, will hold off on home with hospice til tomorrow, to see if patient makes it thru the night.   NCM will cont to follow for dc needs.    3/27 [name] RN, BSN - patient expired this am.                  Action/Plan:   Expected Discharge Date:                  Expected Discharge Plan:  Home w Hospice Care  In-House Referral:     Discharge planning Services  CM Consult  Post Acute Care Choice:  Hospice Choice offered to:  [facility] POA / Guardian  DME Arranged:    DME Agency:     HH Arranged:    [place] Agency:     Status of Service:  In process, will continue to follow  If discussed at Long Length of Stay Meetings, dates discussed:    Additional Comments:  [name], RN 03/22/2017, 3:34 PM

## 2017-03-23 DIAGNOSIS — Z515 Encounter for palliative care: Secondary | ICD-10-CM

## 2017-03-23 LAB — C3 COMPLEMENT: C3 COMPLEMENT: 205 mg/dL — AB (ref 82–167)

## 2017-03-23 LAB — ANTI-DNA ANTIBODY, DOUBLE-STRANDED: ds DNA Ab: <1 IU/mL (ref 0–9)

## 2017-03-23 LAB — ANTI-SCLERODERMA ANTIBODY: Scleroderma (Scl-70) (ENA) Antibody, IgG: <0.2 AI (ref 0.0–0.9)

## 2017-03-23 LAB — ANGIOTENSIN CONVERTING ENZYME: ANGIOTENSIN-CONVERTING ENZYME: 40 U/L (ref 14–82)

## 2017-03-23 LAB — C4 COMPLEMENT: COMPLEMENT C4, BODY FLUID: 40 mg/dL (ref 14–44)

## 2017-03-23 LAB — ANCA TITERS: P-ANCA: <1:20 {titer}

## 2017-03-23 LAB — MPO/PR-3 (ANCA) ANTIBODIES

## 2017-03-23 LAB — SJOGRENS SYNDROME-B EXTRACTABLE NUCLEAR ANTIBODY

## 2017-03-23 LAB — ANTINUCLEAR ANTIBODIES, IFA: ANA Ab, IFA: NEGATIVE

## 2017-03-23 LAB — SJOGRENS SYNDROME-A EXTRACTABLE NUCLEAR ANTIBODY: SSA (Ro) (ENA) Antibody, IgG: <0.2 AI (ref 0.0–0.9)

## 2017-03-23 MED ORDER — WHITE PETROLATUM GEL
Status: AC
  Filled 2017-03-23: qty 1

## 2017-03-24 LAB — HYPERSENSITIVITY PNEUMONITIS
A. Pullulans Abs: NEGATIVE
A.Fumigatus #1 Abs: NEGATIVE
Micropolyspora faeni, IgG: NEGATIVE
Pigeon Serum Abs: NEGATIVE
Thermoact. Saccharii: NEGATIVE
Thermoactinomyces vulgaris, IgG: NEGATIVE

## 2017-03-25 ENCOUNTER — Ambulatory Visit: Payer: Medicare Other | Admitting: Cardiology

## 2017-03-25 LAB — CULTURE, BLOOD (ROUTINE X 2)
CULTURE: NO GROWTH
Culture: NO GROWTH

## 2017-03-28 NOTE — Progress Notes (Addendum)
Waste in sink 141m morphine gtt with CFranki Monte RN  TOD at [phone] family at bedside and RN at bedside at TOD. [place] offered, declined. MD notified and palliative team notified. CDS contacted. PIV DC, hemostasis achieved. Pt to stay in room until picked up from funeral home, bed placement aware of plan. Family at bedside, awaiting arrival from niece, KMaudie Mercury [employer] to pick patient up from room around 1300. Will continue to assess family needs during this time.   1300: funeral home at bedside. Family states KMaudie Mercuryis in the parking deck. Funeral home in waiting area.  1310: Kim, niece at bedside. Emotional support given. Giving family time with patient before DC to funeral home.

## 2017-03-28 NOTE — Progress Notes (Signed)
Called into room by patient family [name] and Lattie Haw at bedside.Lattie Haw verbalized that she was under the impression that staff would be decreasing patient off oxygen.This nurse explained to her that orders are to keep patient comfortable and that patient has been comfortable on high flow oxygen at 15 liters.But if family wanted oxygen to be turned down we could try it but it might make patient agitated and possibly air hungry.[name] stated,"As long as he's comfortable with oxygen where its at please leave it there for now."Asked family if they had any further questions and verbalized no.Will continue to monitor and report off to oncoming shift.

## 2017-03-28 NOTE — Care Management Note (Signed)
Case Management Note  Patient Details  Name: Paul Valdez MRN: [id number] Date of Birth: 1946/05/13  Subjective/Objective:    From home with hx of lung cancer s/p lobectomy 2/5 presents with sob, 5 lb weight loss, COPD, CHF.  NCM received referral for home hospice.  NCM spoke with [name] at the bedside , offered choice, he chose [facility],  NCM made referral to [facility] . [name]'s phone is [phone].  There has been a change in patient status, starting morphine drip, will hold off on home with hospice til tomorrow, to see if patient makes it thru the night.   NCM will cont to follow for dc needs.    3/27 [name] RN, BSN - patient expired this am.                                Action/Plan:   Expected Discharge Date:                  Expected Discharge Plan:  Home w Hospice Care  In-House Referral:     Discharge planning Services  CM Consult  Post Acute Care Choice:  Hospice Choice offered to:  [facility] POA / Guardian  DME Arranged:    DME Agency:     HH Arranged:    [name]:     Status of Service:  Completed, signed off  If discussed at [employer] of Stay Meetings, dates discussed:    Additional Comments:  [name], RN 04/03/17, 9:55 AM

## 2017-03-28 NOTE — Progress Notes (Signed)
Patient has rested comfortably this shift on morphine drip at 2 mg/hr.No prn bolus doses needed this shift.

## 2017-03-28 NOTE — Progress Notes (Signed)
Daily Progress Note   Patient Name: Paul Valdez       Date: 04/22/17 DOB: Jul 22, 1946  Age: 71 y.o. MRN#: [id number] Attending Physician: Domenic Polite, MD Primary Care Physician: Gara Kroner, MD Admit Date: 03/15/2017  Reason for Consultation/Follow-up: Terminal Care  Subjective: Evaluated patient. Actively dying. He remained on face mask O2 with 15L overnight. Visibly uncomfortable with furrowed brow. Discussed with bedside RN, Morghan- bolus doses of morphine and lorazepam given- patient comfortable after receiving medications. I remained at bedside to ensure patient comfort and then titrated O2  down to [facility] without difficulty. Patient continued to rest comfortably. Family and HCPOA remained at bedside. All were thankful for care.  Review of Systems  Unable to perform ROS: Acuity of condition    Length of Stay: 4  Current Medications: Scheduled Meds:  . furosemide  40 mg Intravenous Q12H  . sodium chloride flush  3 mL Intravenous Q12H    Continuous Infusions: . morphine 2 mg/hr (03/22/17 1642)    PRN Meds: sodium chloride, acetaminophen **OR** acetaminophen, glycopyrrolate **OR** glycopyrrolate **OR** glycopyrrolate, haloperidol **OR** haloperidol **OR** haloperidol lactate, ipratropium-albuterol, LORazepam, morphine injection, morphine, ondansetron **OR** ondansetron (ZOFRAN) IV, polyvinyl alcohol, sodium chloride flush  Physical Exam  Constitutional: No distress.  HENT:  Head: Normocephalic and atraumatic.  Cardiovascular:  Tachycardic, distal pulses weak  Pulmonary/Chest: He has wheezes.  Shallow, becoming apneic  Musculoskeletal: He exhibits edema.  No purposeful movements  Neurological:  nonresponsive  Skin:  mottled            Vital Signs: BP 127/64    Pulse 79   Temp 98.4 F (36.9 C) (Oral)   Resp 14   Ht '5\' 7"'$  (1.702 m)   Wt 80.5 kg (177 lb 6.4 oz) Comment: bed scale  SpO2 (!) 88%   BMI 27.78 kg/m  SpO2: SpO2: (!) 88 % O2 Device: O2 Device: High Flow Nasal Cannula O2 Flow Rate: O2 Flow Rate (L/min): 15 L/min  Intake/output summary:  Intake/Output Summary (Last 24 hours) at 04-22-2017 0904 Last data filed at 04/22/17 0000  Gross per 24 hour  Intake            962.6 ml  Output             1475 ml  Net           -  512.4 ml   LBM: Last BM Date: 03/20/17 Baseline Weight: Weight: 85.4 kg (188 lb 4 oz) Most recent weight: Weight: 80.5 kg (177 lb 6.4 oz) (bed scale)       Palliative Assessment/Data: PPS: 10%   Flowsheet Rows     Most Recent Value  Intake Tab  Referral Department  Hospitalist  Unit at Time of Referral  Cardiac/Telemetry Unit  Palliative Care Primary Diagnosis  Cardiac  Date Notified  03/20/17  Palliative Care Type  New Palliative care  Reason for referral  Clarify Goals of Care, Advance Care Planning  Date of Admission  03/09/2017  Date first seen by Palliative Care  03/21/17  # of days Palliative referral response time  1 Day(s)  # of days IP prior to Palliative referral  1  Clinical Assessment  Palliative Performance Scale Score  40%  Pain Max last 24 hours  6  Pain Min Last 24 hours  2  Dyspnea Max Last 24 Hours  8  Dyspnea Min Last 24 hours  4  Nausea Min Last 24 Hours  0  Anxiety Max Last 24 Hours  3  Anxiety Min Last 24 Hours  0  Psychosocial & Spiritual Assessment  Palliative Care Outcomes  Patient/Family meeting held?  Yes  Who was at the meeting?  pt,  FU mtg scheduled for 3/26 @ 10am  Palliative Care follow-up planned  Yes, Facility      Patient Active Problem List   Diagnosis Date Noted  . Advance care planning   . Goals of care, counseling/discussion   . Palliative care by specialist   . Palliative care encounter   . Respiratory failure ([place]) 03/17/2017  . Acute on chronic  respiratory failure with hypoxia ([place]) 03/04/2017  . Anticoagulated   . Dyspnea   . Hypokalemia 03/14/2017  . Acute respiratory distress 03/13/2017  . Acute respiratory failure ([place]) 03/12/2017  . Anemia 03/12/2017  . Hemorrhoids 03/09/2017  . Lung cancer ([place]) 02/15/2017  . 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 Acute diastolic congestive heart failure ([place]) 02/15/2017  . Complicated UTI (urinary tract infection) 02/15/2017  . Left rib fracture 02/15/2017  . Elevated brain natriuretic peptide (BNP) level   . HCAP (healthcare-associated pneumonia)   . Lobar pneumonia ([place])   . Persistent atrial fibrillation with rapid ventricular response ([place]) 02/12/2017  . Atelectasis   . OSA (obstructive sleep apnea)   . S/P lobectomy of lung 02/01/2017  . Coronary artery calcification seen on CT scan 01/13/2017  . PVD (peripheral vascular disease) ([place]) 01/13/2017  . Essential hypertension 01/13/2017  . Hyperlipidemia 01/13/2017  . Nodule of left lung 12/29/2016  . Low back pain 12/29/2016  . Carotid artery disease ([place]) 11/21/2014    Palliative Care Assessment & Plan   Patient Profile: 71 y.o.malewith past medical history of Arthritis, asthma, COPD, atrial fibrillation with cardioversion on 03/16/2017 carotid artery disease, congestive heart failure, GERD, hyperlipidemia, hypertension, low back pain, sleep apnea, lung cancer to left upper lobe with lobectomy 02/05/2018admitted on 3/23/2018with shortness of breath. This admission he has experienced sever respiratory distress with possible causes pneumonia v interstitial lung disease v lymphangitic spread of ca. Plan was to stabilize and transition home with Hospice, however, yesterday evening he declined further and the transition was made to comfort measures only.  Assessment/Recommendations/Plan   Continue comfort measures as ordered  Goals of Care and Additional Recommendations:  Limitations on Scope of Treatment:  Full Comfort Care  Code Status:  DNR  Prognosis:   Hours - Days  Discharge Planning:  Anticipated Hospital Death  Care plan was discussed with HCPOALovena Le, family member- Adam Phenix, and Aileen Pilot, [place].   Thank you for allowing the Palliative Medicine Team to assist in the care of this patient.   Total time: 60 minutes  Greater than 50%  of this time was spent counseling and coordinating care related to the above assessment and plan.  Mariana Kaufman, AGNP-C Palliative Medicine   Please contact Palliative Medicine Team phone at [phone] for questions and concerns.

## 2017-03-28 DEATH — deceased

## 2017-03-31 ENCOUNTER — Ambulatory Visit: Payer: Medicare Other | Admitting: Interventional Cardiology

## 2017-04-05 ENCOUNTER — Ambulatory Visit: Payer: Medicare Other | Admitting: Interventional Cardiology

## 2017-04-27 NOTE — Discharge Summary (Signed)
Death Summary  Paul Valdez KDP:[id number] DOB: 15-Jul-1946 DOA: 04/11/2017  PCP: Gara Kroner, MD  Admit date: April 11, 2017 Date of Death: 04/15/17  Final Diagnoses:  Principal Problem:   Acute on chronic respiratory failure with hypoxia (HCC)   Severe COPD/Chronic resp failure on 4L Home O2   S/P lobectomy of lung   Lung cancer (HCC)   Squamous cell carcinoma lung, left (HCC)   Acute diastolic congestive heart failure ([place])   HCAP (healthcare-associated pneumonia)   Dyspnea   Respiratory failure ([place])   Palliative care encounter   Advance care planning   Goals of care, counseling/discussion   Palliative care by specialist   Terminal care     History of present illness:  71/M with COPD/chronic resp failure on 4L O2, Diastolic CHF, Afib on eliquis,  Lung CA -stage 1B  s/p upper lobe lobectomy in 2/18 , readmitted within 24hours of discharge -last admission 3/16-3/22 with Acute diastolic CHF/ HCAP/AFib RVR s/p diuresis-negative 9L at discharge, s/p cardioversion  To NSR and started Flecainide, Abx therapy, back with worsening Hypoxia  Hospital Course:  Acute on chronic respiratory failure with hypoxia (HCC) -required BiPAP, nonrebreather and then was on high flow O2-15L-approx 70% -multifactorial, mild component of diastolic CHF, ? HCAP vs Interstitial lung dz and with underlying COPD -PCCM consulted 3/25, due to concern for interstitial lung disease -treated with BIPAP,  IV Lasix, back on Vanc and Cefepime abd IV steroids, but continued to decline -Palliative medicine consulted and decision made for Comfort care/morphine for comfort and pt expired on April 16, 2023  H/o Stage 1B [place] Lung CA -s/p lobectomy of lung in February -? Possibility of recurrence based on CT  Acute on Chronic diastolci CHF -s/p aggressive diureses last admit, 9 L negative at the time of discharge -see above  COPD/chronic respiratory failure -No wheezing stopped IV steroids -Nebs when  necessary  Paroxysmal atrial fibrillation -Status post DC cardioversion last week and now on Flecainide -see above  Adult failure to thrive -Multiple cardiopulmonary issues, frequent readmission, chronic respiratory failure, discussed in detail with POA along with patient and requested palliative consult for goals of care, s/p family meeting and comfort care, expired inpatient    Time: 35  Signed:  Hughie Melroy  [facility] 03/31/2017, 3:28 PM

## 2017-09-06 ENCOUNTER — Other Ambulatory Visit: Payer: Medicare Other

## 2017-09-08 ENCOUNTER — Ambulatory Visit: Payer: Medicare Other | Admitting: Internal Medicine

## 2018-08-18 IMAGING — MR MR HEAD WO/W CM
10 of 12 series · 36 of 48 positions shown · IV contrast (Yes)
Comparison: PET scan reported separately.  CT chest 12/11/2016.

CLINICAL DATA: Staging primary lung cancer. Evaluate for metastatic
disease.

EXAM:
MRI HEAD WITHOUT AND WITH CONTRAST
TECHNIQUE: Multiplanar, multiecho pulse sequences of the brain and surrounding
structures were obtained without and with intravenous contrast.
CONTRAST:  20mL MULTIHANCE GADOBENATE DIMEGLUMINE 529 MG/ML IV SOLN

[Series 3: T1 · sagittal · 5.0mm · 0.47mm/px · 2 of 24 slices shown]
[im 1/24]
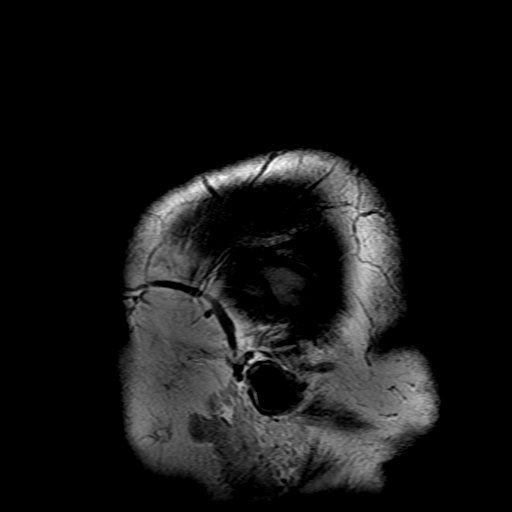
[im 24/24]
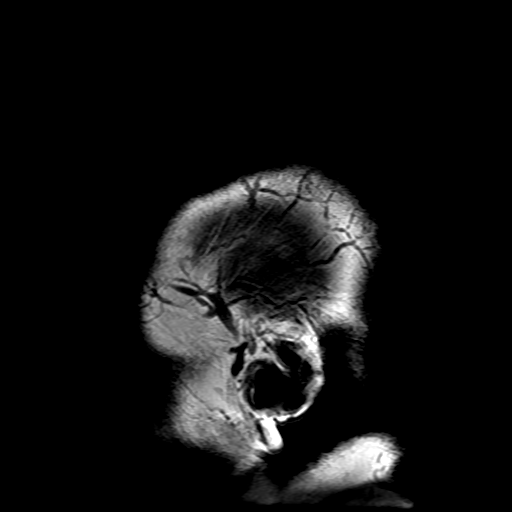

[Series 4: DWI · axial · 3.0mm · 1.09mm/px · z∈[-57,+87]mm · 9 of 100 slices shown (1 of 4)]
[im 1/100]
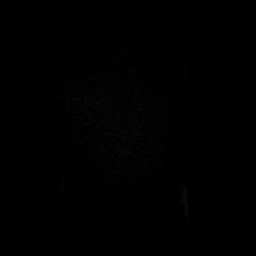
[im 13/100]
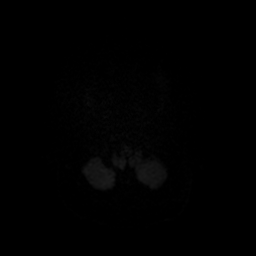
[im 25/100]
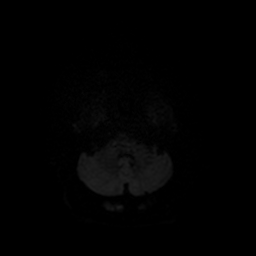
[im 38/100]
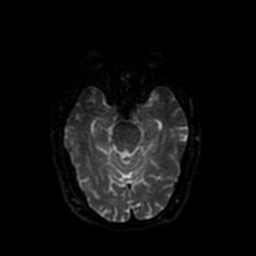
[im 50/100]
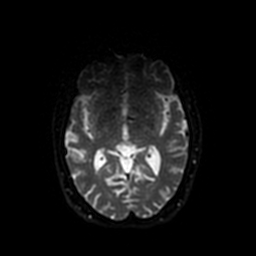
[im 62/100]
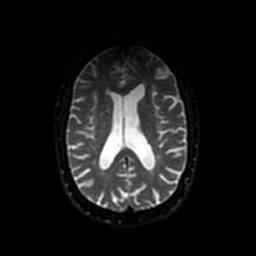
[im 75/100]
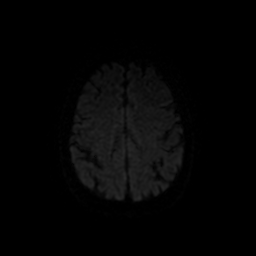
[im 87/100]
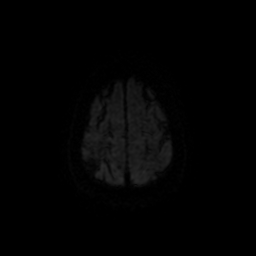
[im 100/100]
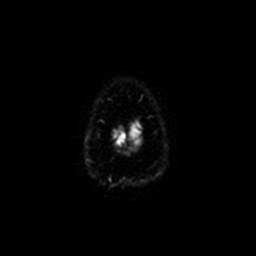

[Series 5: DWI · coronal · 5.0mm · 1.09mm/px · 7 of 72 slices shown (2 of 4)]
[im 1/72]
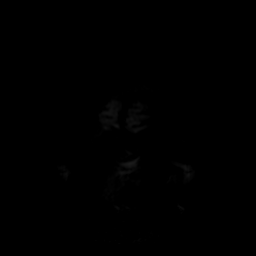
[im 12/72]
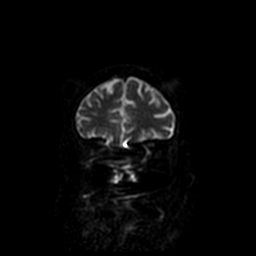
[im 24/72]
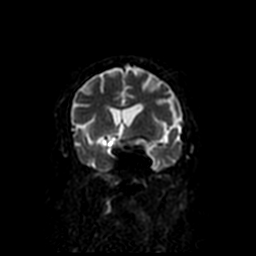
[im 36/72]
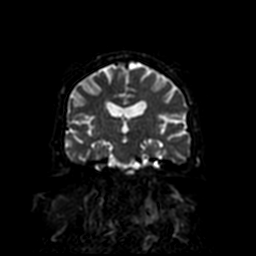
[im 48/72]
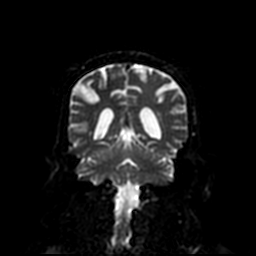
[im 60/72]
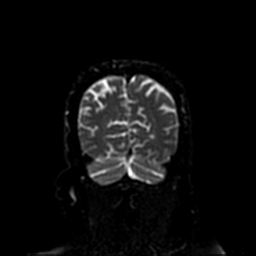
[im 72/72]
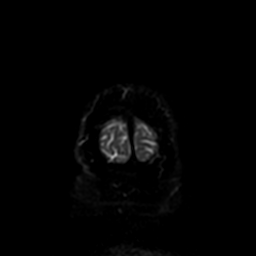

[Series 6: T2 · axial · 5.0mm · 0.43mm/px · z∈[-58,+86]mm · 2 of 24 slices shown]
[im 1/24]
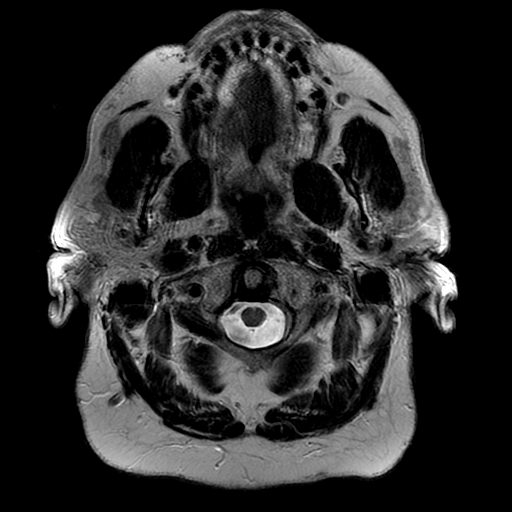
[im 24/24]
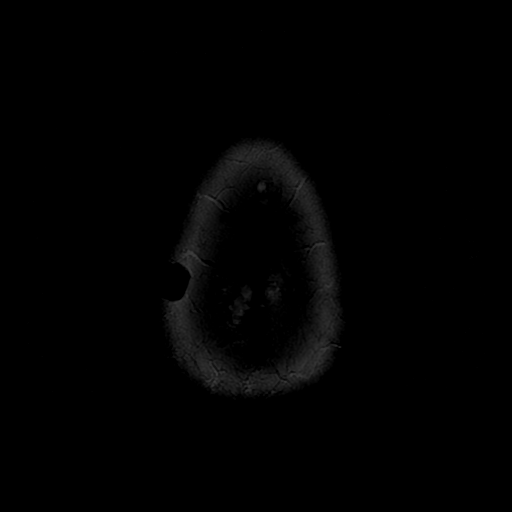

[Series 7: FLAIR · axial · 5.0mm · 0.43mm/px · z∈[-64,+92]mm · 2 of 24 slices shown]
[im 1/24]
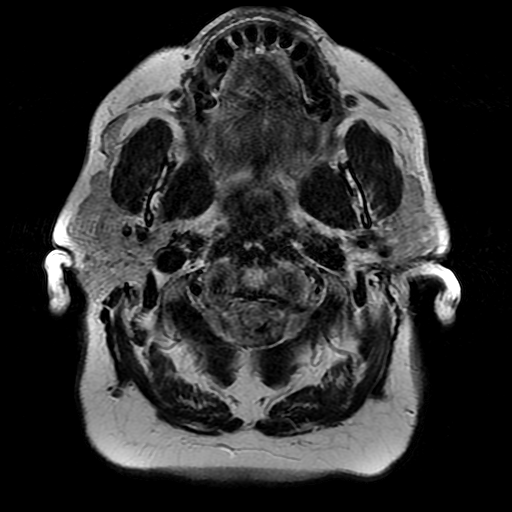
[im 24/24]
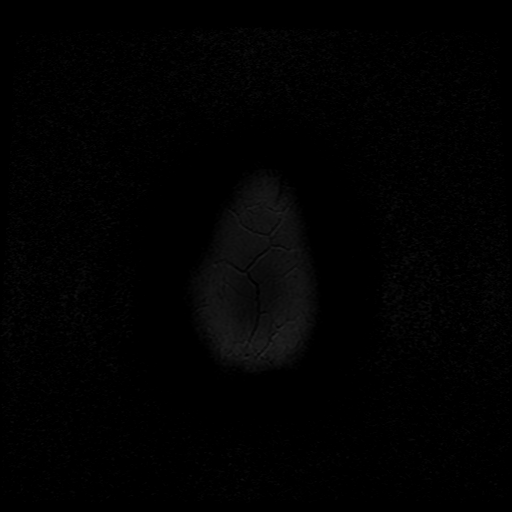

[Series 8: ax mpgr · axial · 5.0mm · 0.43mm/px · z∈[-64,+92]mm · 2 of 24 slices shown]
[im 1/24]
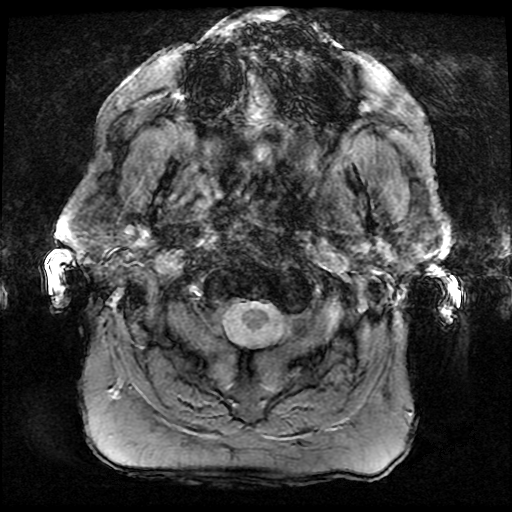
[im 24/24]
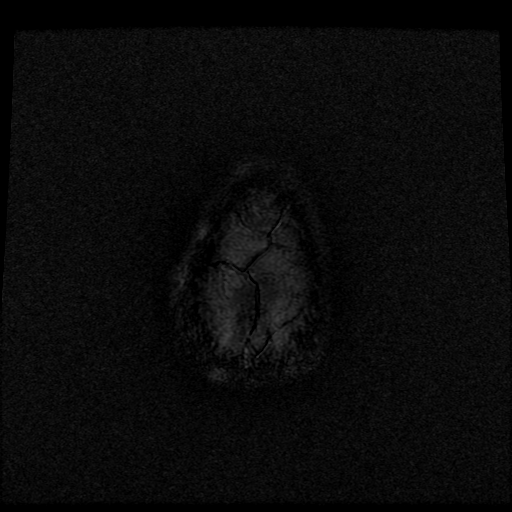

[Series 10: T2 post-contrast · coronal · 5.0mm · 0.45mm/px · 2 of 24 slices shown]
[im 1/24]
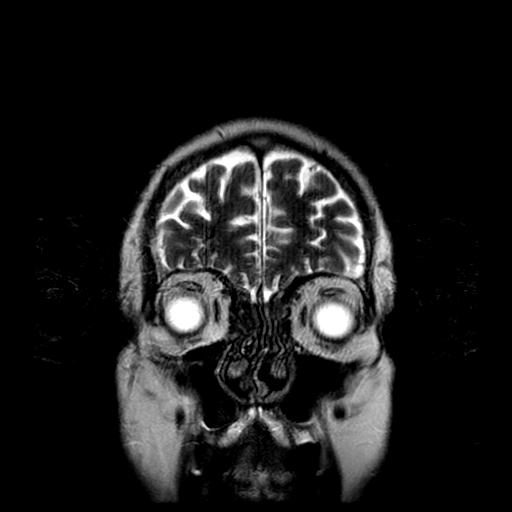
[im 24/24]
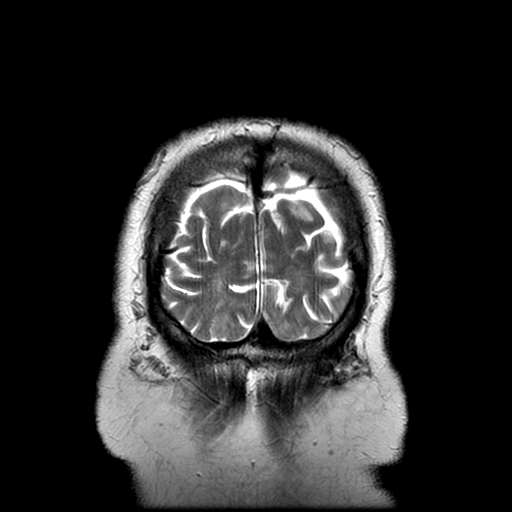

[Series 12: T1 post-contrast · coronal · 5.0mm · 0.45mm/px · 2 of 24 slices shown]
[im 1/24]
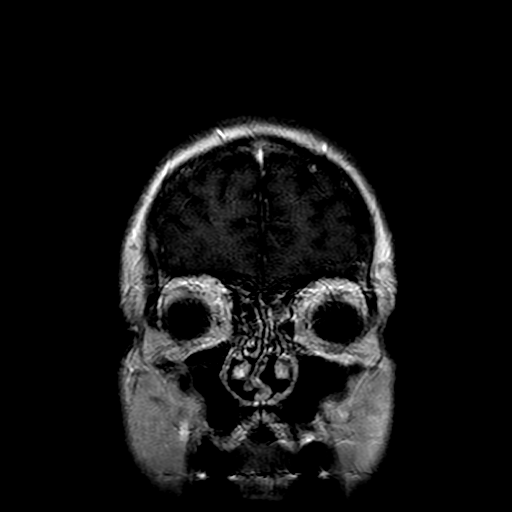
[im 24/24]
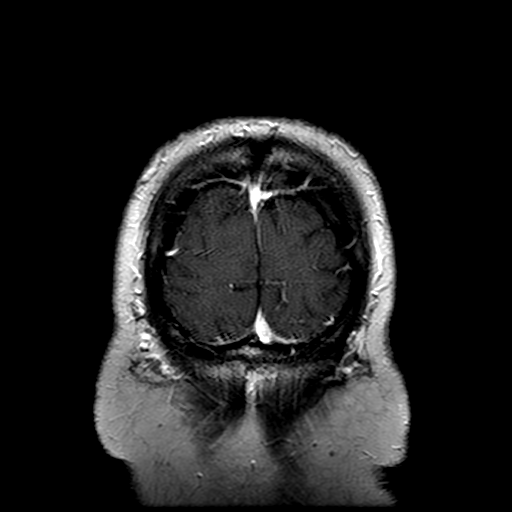

[Series 400: DWI · axial · 3.0mm · 1.09mm/px · z∈[-57,+87]mm · 5 of 50 slices shown (3 of 4)]
[im 1/50]
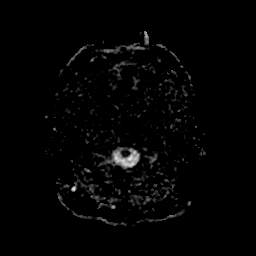
[im 13/50]
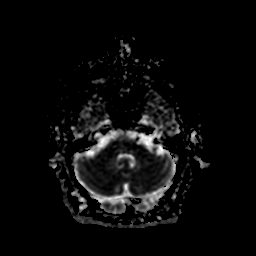
[im 25/50]
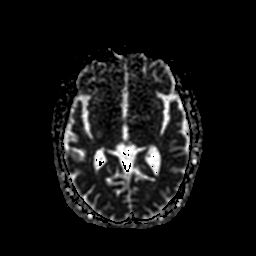
[im 37/50]
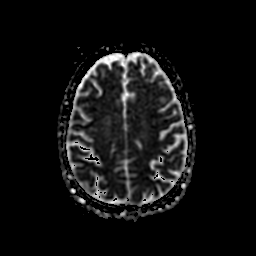
[im 50/50]
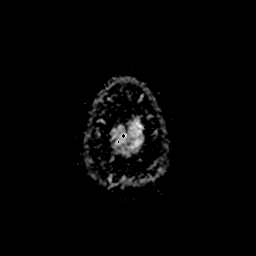

[Series 500: DWI · coronal · 5.0mm · 1.09mm/px · 3 of 36 slices shown (4 of 4)]
[im 1/36]
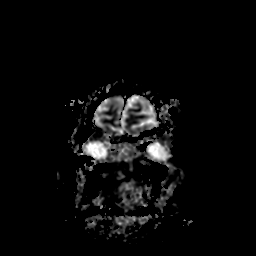
[im 18/36]
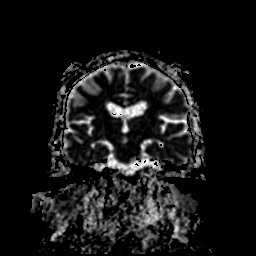
[im 36/36]
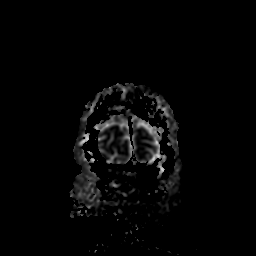

[36 of 48 positions shown; findings below may reference images not displayed]

FINDINGS: Brain: No evidence for acute infarction, hemorrhage, mass lesion,
hydrocephalus, or extra-axial fluid. Mild cerebral atrophy. Mild
subcortical and periventricular T2 and FLAIR hyperintensities,
likely chronic microvascular ischemic change.

Post infusion, no abnormal enhancement of brain or meninges.

Vascular: Flow voids are maintained throughout the carotid, basilar,
and vertebral arteries. There are no areas of chronic hemorrhage.
Major dural venous sinuses are patent.

Skull and upper cervical spine: Unremarkable visualized calvarium,
skullbase, and cervical vertebrae. Pituitary, pineal, cerebellar
tonsils unremarkable. No upper cervical cord lesions.

Sinuses/Orbits: No orbital masses or proptosis. Globes appear
symmetric. Sinuses appear well aerated, without evidence for
air-fluid level.

Other: Large sebaceous cyst, RIGHT posterior frontal scalp.
IMPRESSION: No intracranial metastatic disease is evident.

Mild atrophy and small vessel disease.

## 2018-08-18 IMAGING — CT NM PET TUM IMG INITIAL (PI) SKULL BASE T - THIGH
1 of 7 series · 1 of 25 positions shown · non-contrast
Comparison: Chest CT dated 12/11/2016

CLINICAL DATA: Initial treatment strategy for left upper lobe mass.

EXAM:
NUCLEAR MEDICINE PET SKULL BASE TO THIGH
TECHNIQUE: 10.5 mCi F-18 FDG was injected intravenously. Full-ring PET imaging
was performed from the skull base to thigh after the radiotracer. CT
data was obtained and used for attenuation correction and anatomic
localization.
FASTING BLOOD GLUCOSE:  Value: 103 mg/dl

[Series 4: ct sk_thigh 5.0 b31f · axial · 5.0mm · 0.98mm/px · 1 of 220 slices shown]
[im 220/220  brain]
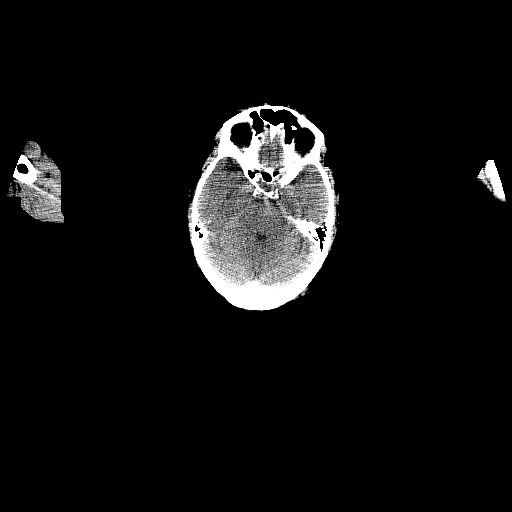

[1 of 25 positions shown; findings below may reference images not displayed]

FINDINGS: NECK

No hypermetabolic lymph nodes in the neck.

CHEST

The left upper lobe mass measures 4.3 by 3.3 cm on image [DATE],
stable, and extends to the visceral pleural surface, parietal
pleural involvement uncertain but no pleural effusion seen. The
masses centrally photopenic but has high signal along its edges,
maximum SUV

No hypermetabolic adenopathy in the chest.

Moderate-sized hiatal hernia. Coronary, aortic arch, and branch
vessel atherosclerotic vascular disease.

Right posterior in med at diaphragmatic eventration. Paraseptal
emphysema.

ABDOMEN/PELVIS

Scattered regions likely physiologic bowel activity noted although
some of the areas including at the junction of the descending and
sigmoid colon have some focally accentuated metabolic activity which
could conceivably reflect a small polyp, for example maximum SUV at
the junction of the descending and sigmoid colon is 7.8 if there is
some focally accentuated activity at the anus without CT correlate,
probably physiologic.

Central prostate calcifications noted. Hernia mesh along the lower
anterior abdominopelvic wall.

Bilateral nonobstructive nephrolithiasis. Aortoiliac atherosclerotic
vascular disease. Sigmoid colon diverticulosis.

SKELETON

No focal hypermetabolic activity to suggest skeletal metastasis.
IMPRESSION: 1. Hypermetabolic left upper lobe mass, maximum SUV 7.7, favoring
malignancy. Central photopenia probably from central necrosis.
Abscess or granulomatous infectious process is not totally excluded
but significantly less likely. Assuming non-small cell lung cancer
and visceral pleural extension, this represents T2a N0 M0 disease
(stage IB).
2. There are some focal areas of hypermetabolic activity in the
bowel, which are probably physiologic but which could possibly
represent polyps. One of these is at the anus and the other is at
the junction of the descending and sigmoid colon.
3. Other imaging findings of potential clinical significance:
Moderate-sized hiatal hernia. Coronary, aortic arch, and branch
vessel atherosclerotic vascular disease. Aortoiliac atherosclerotic
vascular disease. Bilateral nonobstructive nephrolithiasis. Hernia
mesh along the lower anterior abdominopelvic wall. Paraseptal
emphysema. Sigmoid colon diverticulosis.

## 2018-09-29 IMAGING — CR DG CHEST 1V PORT
1 series · 1 of 1 positions shown · non-contrast
Comparison: Chest radiograph dated 02/09/2017

CLINICAL DATA: 71-year-old male with fever and shortness of breath.

EXAM:
PORTABLE CHEST 1 VIEW

[AP]
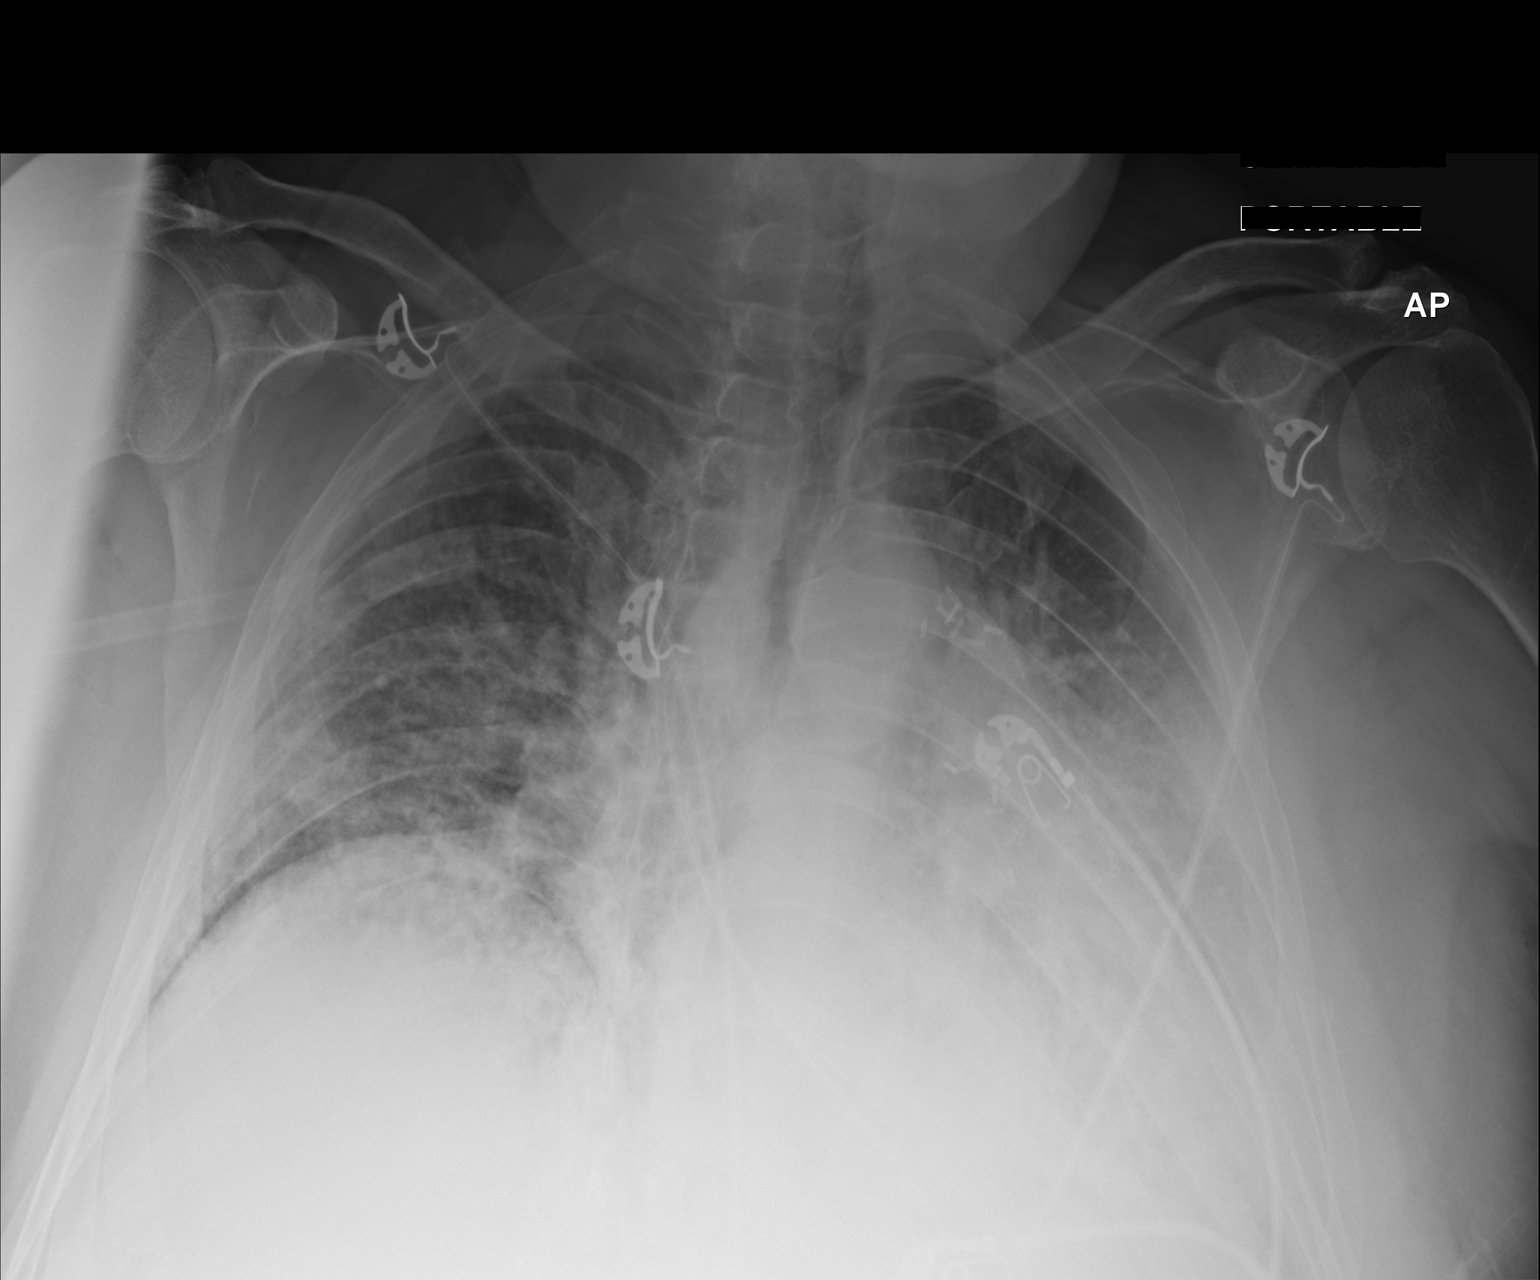

[1 of 1 positions shown; findings below may reference images not displayed]

FINDINGS: There is low lung volumes. There progression of airspace density
involving the left mid to lower lung fields concerning for
developing infiltrate. Right mid and lower lung field interstitial
prominence and streaky densities noted. There is no significant
pleural effusion. No pneumothorax. There is silhouetting of the
cardiac borders. Multiple surgical clips noted in the left hilar and
suprahilar region. There is minimally displaced fracture of the
lateral left fifth rib which appears new compared to prior study.
Correlation with point tenderness recommended.
IMPRESSION: 1. Increased left mid for lower lung field airspace opacity
concerning for developing pneumonia versus pulmonary contusion.
Right mid to lower lung field interstitial prominence and streaky
densities may represent atelectasis versus infiltrate.
2. Minimally displaced fracture of the lateral aspect of the left
fifth rib, new from prior study. No pneumothorax.
